# Patient Record
Sex: Female | Born: 1975
Health system: Southern US, Community
[De-identification: ages and names within clinical notes are randomized; demographics above are authoritative.]

## PROBLEM LIST (undated history)

## (undated) DIAGNOSIS — S92912A Unspecified fracture of left toe(s), initial encounter for closed fracture: Secondary | ICD-10-CM

## (undated) DIAGNOSIS — G43909 Migraine, unspecified, not intractable, without status migrainosus: Secondary | ICD-10-CM

## (undated) DIAGNOSIS — Z87898 Personal history of other specified conditions: Secondary | ICD-10-CM

## (undated) DIAGNOSIS — Z9889 Other specified postprocedural states: Secondary | ICD-10-CM

## (undated) DIAGNOSIS — K219 Gastro-esophageal reflux disease without esophagitis: Secondary | ICD-10-CM

## (undated) DIAGNOSIS — R112 Nausea with vomiting, unspecified: Secondary | ICD-10-CM

## (undated) DIAGNOSIS — M545 Low back pain, unspecified: Secondary | ICD-10-CM

## (undated) DIAGNOSIS — Z8669 Personal history of other diseases of the nervous system and sense organs: Secondary | ICD-10-CM

## (undated) DIAGNOSIS — F329 Major depressive disorder, single episode, unspecified: Secondary | ICD-10-CM

## (undated) DIAGNOSIS — E559 Vitamin D deficiency, unspecified: Secondary | ICD-10-CM

## (undated) DIAGNOSIS — G4700 Insomnia, unspecified: Secondary | ICD-10-CM

## (undated) DIAGNOSIS — M1990 Unspecified osteoarthritis, unspecified site: Secondary | ICD-10-CM

## (undated) DIAGNOSIS — F32A Depression, unspecified: Secondary | ICD-10-CM

## (undated) DIAGNOSIS — F419 Anxiety disorder, unspecified: Secondary | ICD-10-CM

## (undated) DIAGNOSIS — M793 Panniculitis, unspecified: Secondary | ICD-10-CM

## (undated) DIAGNOSIS — E519 Thiamine deficiency, unspecified: Secondary | ICD-10-CM

## (undated) DIAGNOSIS — E538 Deficiency of other specified B group vitamins: Secondary | ICD-10-CM

## (undated) HISTORY — PX: BREAST BIOPSY: SHX20

## (undated) HISTORY — PX: BREAST CYST ASPIRATION: SHX578

---

## 1999-08-10 HISTORY — PX: TUBAL LIGATION: SHX77

## 2003-04-26 ENCOUNTER — Emergency Department (HOSPITAL_COMMUNITY): Admission: EM | Admit: 2003-04-26 | Discharge: 2003-04-26 | Payer: Self-pay

## 2003-10-22 ENCOUNTER — Emergency Department (HOSPITAL_COMMUNITY): Admission: EM | Admit: 2003-10-22 | Discharge: 2003-10-22 | Payer: Self-pay | Admitting: Emergency Medicine

## 2004-04-16 ENCOUNTER — Other Ambulatory Visit: Admission: RE | Admit: 2004-04-16 | Discharge: 2004-04-16 | Payer: Self-pay | Admitting: Family Medicine

## 2004-04-16 ENCOUNTER — Ambulatory Visit: Payer: Self-pay | Admitting: Family Medicine

## 2004-05-04 ENCOUNTER — Ambulatory Visit: Payer: Self-pay | Admitting: Family Medicine

## 2004-05-04 ENCOUNTER — Ambulatory Visit (HOSPITAL_COMMUNITY): Admission: RE | Admit: 2004-05-04 | Discharge: 2004-05-04 | Payer: Self-pay | Admitting: Family Medicine

## 2004-05-06 ENCOUNTER — Ambulatory Visit (HOSPITAL_COMMUNITY): Admission: RE | Admit: 2004-05-06 | Discharge: 2004-05-06 | Payer: Self-pay | Admitting: Family Medicine

## 2004-05-06 ENCOUNTER — Encounter: Payer: Self-pay | Admitting: Cardiology

## 2004-05-12 ENCOUNTER — Emergency Department (HOSPITAL_COMMUNITY): Admission: EM | Admit: 2004-05-12 | Discharge: 2004-05-13 | Payer: Self-pay | Admitting: Emergency Medicine

## 2004-09-08 ENCOUNTER — Ambulatory Visit: Payer: Self-pay | Admitting: Family Medicine

## 2004-09-25 ENCOUNTER — Ambulatory Visit: Payer: Self-pay | Admitting: Family Medicine

## 2005-02-28 ENCOUNTER — Emergency Department (HOSPITAL_COMMUNITY): Admission: EM | Admit: 2005-02-28 | Discharge: 2005-02-28 | Payer: Self-pay | Admitting: Emergency Medicine

## 2005-10-06 ENCOUNTER — Ambulatory Visit: Payer: Self-pay | Admitting: Family Medicine

## 2005-10-12 ENCOUNTER — Ambulatory Visit (HOSPITAL_COMMUNITY): Admission: RE | Admit: 2005-10-12 | Discharge: 2005-10-12 | Payer: Self-pay | Admitting: Family Medicine

## 2006-02-17 ENCOUNTER — Ambulatory Visit: Payer: Self-pay | Admitting: Family Medicine

## 2006-02-17 ENCOUNTER — Encounter (INDEPENDENT_AMBULATORY_CARE_PROVIDER_SITE_OTHER): Payer: Self-pay | Admitting: Family Medicine

## 2006-09-06 ENCOUNTER — Ambulatory Visit: Payer: Self-pay | Admitting: Family Medicine

## 2006-09-29 ENCOUNTER — Ambulatory Visit: Payer: Self-pay | Admitting: Family Medicine

## 2007-01-01 ENCOUNTER — Emergency Department (HOSPITAL_COMMUNITY): Admission: EM | Admit: 2007-01-01 | Discharge: 2007-01-01 | Payer: Self-pay | Admitting: Emergency Medicine

## 2007-09-20 ENCOUNTER — Ambulatory Visit: Payer: Self-pay | Admitting: Family Medicine

## 2007-10-16 ENCOUNTER — Ambulatory Visit: Payer: Self-pay | Admitting: Family Medicine

## 2007-11-23 ENCOUNTER — Ambulatory Visit: Payer: Self-pay | Admitting: Family Medicine

## 2007-11-23 ENCOUNTER — Other Ambulatory Visit: Admission: RE | Admit: 2007-11-23 | Discharge: 2007-11-23 | Payer: Self-pay | Admitting: Family Medicine

## 2007-11-23 ENCOUNTER — Encounter (INDEPENDENT_AMBULATORY_CARE_PROVIDER_SITE_OTHER): Payer: Self-pay | Admitting: Family Medicine

## 2007-11-23 LAB — CONVERTED CEMR LAB
ALT: 14 units/L (ref 0–35)
AST: 16 units/L (ref 0–37)
Albumin: 4 g/dL (ref 3.5–5.2)
Alkaline Phosphatase: 58 units/L (ref 39–117)
BUN: 11 mg/dL (ref 6–23)
CO2: 23 meq/L (ref 19–32)
Calcium: 9 mg/dL (ref 8.4–10.5)
Chlamydia, DNA Probe: NEGATIVE
Chloride: 104 meq/L (ref 96–112)
Cholesterol: 166 mg/dL (ref 0–200)
Creatinine, Ser: 0.75 mg/dL (ref 0.40–1.20)
GC Probe Amp, Genital: NEGATIVE
Glucose, Bld: 89 mg/dL (ref 70–99)
HDL: 41 mg/dL (ref >39)
LDL Cholesterol: 110 mg/dL — ABNORMAL HIGH (ref 0–99)
Potassium: 4.2 meq/L (ref 3.5–5.3)
Sodium: 139 meq/L (ref 135–145)
TSH: 1.611 microintl units/mL (ref 0.350–5.50)
Total Bilirubin: 0.9 mg/dL (ref 0.3–1.2)
Total CHOL/HDL Ratio: 4
Total Protein: 7.2 g/dL (ref 6.0–8.3)
Triglycerides: 73 mg/dL (ref <150)
VLDL: 15 mg/dL (ref 0–40)

## 2007-12-05 ENCOUNTER — Encounter: Admission: RE | Admit: 2007-12-05 | Discharge: 2007-12-05 | Payer: Self-pay | Admitting: Family Medicine

## 2008-05-21 ENCOUNTER — Ambulatory Visit: Payer: Self-pay | Admitting: Internal Medicine

## 2009-04-17 ENCOUNTER — Encounter (INDEPENDENT_AMBULATORY_CARE_PROVIDER_SITE_OTHER): Payer: Self-pay | Admitting: Family Medicine

## 2009-04-17 ENCOUNTER — Other Ambulatory Visit: Admission: RE | Admit: 2009-04-17 | Discharge: 2009-04-17 | Payer: Self-pay | Admitting: Family Medicine

## 2009-04-17 ENCOUNTER — Ambulatory Visit: Payer: Self-pay | Admitting: Family Medicine

## 2009-04-17 LAB — CONVERTED CEMR LAB
BUN: 12 mg/dL (ref 6–23)
CO2: 24 meq/L (ref 19–32)
Calcium: 8.8 mg/dL (ref 8.4–10.5)
Chlamydia, DNA Probe: NEGATIVE
Chloride: 103 meq/L (ref 96–112)
Creatinine, Ser: 0.68 mg/dL (ref 0.40–1.20)
GC Probe Amp, Genital: NEGATIVE
Glucose, Bld: 86 mg/dL (ref 70–99)
Potassium: 4.7 meq/L (ref 3.5–5.3)
Sed Rate: 29 mm/hr — ABNORMAL HIGH (ref 0–22)
Sodium: 138 meq/L (ref 135–145)
Vit D, 25-Hydroxy: 29 ng/mL — ABNORMAL LOW (ref 30–89)

## 2010-02-19 ENCOUNTER — Ambulatory Visit: Payer: Self-pay | Admitting: Family Medicine

## 2010-03-03 ENCOUNTER — Ambulatory Visit (HOSPITAL_COMMUNITY): Admission: RE | Admit: 2010-03-03 | Discharge: 2010-03-03 | Payer: Self-pay | Admitting: Family Medicine

## 2010-03-23 ENCOUNTER — Encounter: Payer: Self-pay | Admitting: Family Medicine

## 2010-03-30 ENCOUNTER — Ambulatory Visit: Payer: Self-pay | Admitting: Family Medicine

## 2010-03-30 DIAGNOSIS — M25569 Pain in unspecified knee: Secondary | ICD-10-CM | POA: Insufficient documentation

## 2010-08-09 LAB — HM PAP SMEAR: HM Pap smear: NORMAL

## 2010-08-30 ENCOUNTER — Encounter: Payer: Self-pay | Admitting: Family Medicine

## 2010-09-08 NOTE — Assessment & Plan Note (Signed)
Summary: NP,CHRONIC L KNEE PAIN,MC   Vital Signs:  Patient profile:   35 year old female Height:      65 inches Weight:      245 pounds BMI:     40.92 BP sitting:   114 / 90  Vitals Entered By: Lillia Pauls CMA (March 30, 2010 3:58 PM)  History of Present Illness: New patient sent for further eval of  left knee pain  in 2006 she injured her left knee--was squatting and rotated to left and felt an acute pain. Not a Worker's Comp case. Has not had imaging. Has not done PT  Was painful for several days and got some better but never totally better. Now has pain daily, 5/10 - 7-10 at worst. worse  with activity or exercise. wants to get back to gym and is afraid of hurting it further.Has seemed to worsen in last few months. Left leg feels weak but no giving way, no locking. Does have occasional popping. No swelling or redness or warmth. No radiation of pain into calf.  PERTINENT PMH/PSH: no surgeries to left knee not diabetic ALLERGIES: NKDA MEDS: None  Current Medications (verified): 1)  None  Allergies (verified): No Known Drug Allergies  Review of Systems       some weigt gain over last few years. denies numbness or tingling in extremities  Physical Exam  General:  overweight-appearing.   Additional Exam:  XRAYS: 03/03/2010 reveal normal joint space in non standing films, no significant DJD.Report read as negative exam.  Patient given informed consent for injection. Discussed possible complications of infection, bleeding or skin atrophy at site of injection. Possible side effect of avascular necrosis (focal area of bone death) due to steroid use.Appropriate verbal time out taken Are cleaned and prepped in usual sterile fashion. A --1-- cc kennalog plus --4--cc 1% lidocaine without epinephrine was injected into the- left knee using anterior approach.--. Patient tolerated procedure well with no complications.    Knee Exam  Knee Exam:    Right:    Inspection:  Normal  Palpation:  Normal    Stability:  stable    Left:    Inspection:  Normal    mild TTP medial joint line. Liogamentously intact varus, valgus and drawer, lachman. Negative mcMurray. No pivot shift. No effusion, no warmth or redness. Patella normal tracking.  Special tests:    GAIT: normal stride length.  Some pain with squat Left knee  Patellar mobility:    Right normal    Left normal Patellar apprehension:    Right negative    Left negative   Impression & Recommendations:  Problem # 1:  KNEE PAIN, LEFT (ICD-719.46) chronic left kneepain--likely associated with small meniscal tear given history and exam. Does not look like a lot of DJD on films with preeserved joint space.    We discussed ( 20 minutes face to face time in counseling and discussion) activities that would be beneficial (bike, weight loss, swimm etc) and things to avoid (squats, leg extension with heavy weights--rec start w 5 pound left leg--currentl;y doing 45 punds right). Will try single injection to see if we can get her some pain relief. HEP HO given for knee strengthening (short arc quads esp). rtc as needed. Would not recommend repeated injections. RTC if not improviong with above regimen.

## 2010-09-08 NOTE — Letter (Signed)
Summary: [facility] Referral  [facility] Referral   Imported By: Marily Memos 03/25/2010 09:14:36  _____________________________________________________________________  External Attachment:    Type:   Image     Comment:   External Document

## 2010-10-01 ENCOUNTER — Inpatient Hospital Stay (INDEPENDENT_AMBULATORY_CARE_PROVIDER_SITE_OTHER)
Admission: RE | Admit: 2010-10-01 | Discharge: 2010-10-01 | Disposition: A | Discharge status: Home / Self Care | Payer: Medicaid Other | Source: Ambulatory Visit | Attending: Family Medicine | Admitting: Family Medicine

## 2010-10-01 DIAGNOSIS — J019 Acute sinusitis, unspecified: Secondary | ICD-10-CM

## 2011-02-05 ENCOUNTER — Ambulatory Visit (HOSPITAL_COMMUNITY)
Admission: RE | Admit: 2011-02-05 | Discharge: 2011-02-05 | Disposition: A | Discharge status: Home / Self Care | Payer: BC Managed Care – PPO | Source: Ambulatory Visit | Attending: Family Medicine | Admitting: Family Medicine

## 2011-02-05 ENCOUNTER — Other Ambulatory Visit (HOSPITAL_COMMUNITY): Payer: Self-pay | Admitting: Family Medicine

## 2011-02-05 DIAGNOSIS — M545 Low back pain, unspecified: Secondary | ICD-10-CM

## 2011-02-05 DIAGNOSIS — R209 Unspecified disturbances of skin sensation: Secondary | ICD-10-CM | POA: Insufficient documentation

## 2011-02-23 ENCOUNTER — Other Ambulatory Visit: Payer: Self-pay | Admitting: Family Medicine

## 2011-02-23 DIAGNOSIS — M545 Low back pain, unspecified: Secondary | ICD-10-CM

## 2011-03-03 ENCOUNTER — Other Ambulatory Visit: Payer: BC Managed Care – PPO

## 2011-03-21 ENCOUNTER — Inpatient Hospital Stay (INDEPENDENT_AMBULATORY_CARE_PROVIDER_SITE_OTHER)
Admission: RE | Admit: 2011-03-21 | Discharge: 2011-03-21 | Disposition: A | Discharge status: Home / Self Care | Payer: BC Managed Care – PPO | Source: Ambulatory Visit | Attending: Family Medicine | Admitting: Family Medicine

## 2011-03-21 DIAGNOSIS — R55 Syncope and collapse: Secondary | ICD-10-CM

## 2011-03-21 DIAGNOSIS — J069 Acute upper respiratory infection, unspecified: Secondary | ICD-10-CM

## 2011-03-21 DIAGNOSIS — R040 Epistaxis: Secondary | ICD-10-CM

## 2011-03-21 LAB — POCT I-STAT, CHEM 8
BUN: 7 mg/dL (ref 6–23)
Calcium, Ion: 1.16 mmol/L (ref 1.12–1.32)
Chloride: 102 mEq/L (ref 96–112)
Creatinine, Ser: 0.7 mg/dL (ref 0.50–1.10)
Glucose, Bld: 113 mg/dL — ABNORMAL HIGH (ref 70–99)
HCT: 39 % (ref 36.0–46.0)
Hemoglobin: 13.3 g/dL (ref 12.0–15.0)
Potassium: 3.7 mEq/L (ref 3.5–5.1)
Sodium: 141 mEq/L (ref 135–145)
TCO2: 26 mmol/L (ref 0–100)

## 2011-03-29 ENCOUNTER — Inpatient Hospital Stay (INDEPENDENT_AMBULATORY_CARE_PROVIDER_SITE_OTHER)
Admission: RE | Admit: 2011-03-29 | Discharge: 2011-03-29 | Disposition: A | Discharge status: Home / Self Care | Payer: BC Managed Care – PPO | Source: Ambulatory Visit | Attending: Emergency Medicine | Admitting: Emergency Medicine

## 2011-03-29 ENCOUNTER — Ambulatory Visit (INDEPENDENT_AMBULATORY_CARE_PROVIDER_SITE_OTHER): Payer: BC Managed Care – PPO

## 2011-03-29 DIAGNOSIS — R059 Cough, unspecified: Secondary | ICD-10-CM

## 2011-03-29 DIAGNOSIS — R05 Cough: Secondary | ICD-10-CM

## 2011-07-14 ENCOUNTER — Ambulatory Visit (INDEPENDENT_AMBULATORY_CARE_PROVIDER_SITE_OTHER): Payer: BC Managed Care – PPO | Admitting: Internal Medicine

## 2011-07-14 ENCOUNTER — Encounter: Payer: Self-pay | Admitting: Internal Medicine

## 2011-07-14 ENCOUNTER — Other Ambulatory Visit (INDEPENDENT_AMBULATORY_CARE_PROVIDER_SITE_OTHER): Payer: BC Managed Care – PPO

## 2011-07-14 VITALS — BP 108/72 | HR 71 | Temp 98.1°F | Resp 16 | Ht 65.0 in | Wt 268.0 lb

## 2011-07-14 DIAGNOSIS — Z0000 Encounter for general adult medical examination without abnormal findings: Secondary | ICD-10-CM

## 2011-07-14 DIAGNOSIS — Z23 Encounter for immunization: Secondary | ICD-10-CM

## 2011-07-14 DIAGNOSIS — E669 Obesity, unspecified: Secondary | ICD-10-CM | POA: Insufficient documentation

## 2011-07-14 LAB — COMPREHENSIVE METABOLIC PANEL
ALT: 28 U/L (ref 0–35)
AST: 29 U/L (ref 0–37)
Calcium: 8.8 mg/dL (ref 8.4–10.5)
Chloride: 107 mEq/L (ref 96–112)
Creatinine, Ser: 0.7 mg/dL (ref 0.4–1.2)
Sodium: 141 mEq/L (ref 135–145)
Total Protein: 7.1 g/dL (ref 6.0–8.3)

## 2011-07-14 LAB — CBC WITH DIFFERENTIAL/PLATELET
Basophils Absolute: 0 10*3/uL (ref 0.0–0.1)
Eosinophils Absolute: 0.4 10*3/uL (ref 0.0–0.7)
Lymphocytes Relative: 32.3 % (ref 12.0–46.0)
MCHC: 33.3 g/dL (ref 30.0–36.0)
Monocytes Relative: 9.1 % (ref 3.0–12.0)
Neutro Abs: 3.8 10*3/uL (ref 1.4–7.7)
Neutrophils Relative %: 52.5 % (ref 43.0–77.0)
Platelets: 371 10*3/uL (ref 150.0–400.0)
RDW: 14.7 % — ABNORMAL HIGH (ref 11.5–14.6)

## 2011-07-14 LAB — LIPID PANEL
Cholesterol: 149 mg/dL (ref 0–200)
HDL: 38 mg/dL — ABNORMAL LOW (ref >39.00)
LDL Cholesterol: 92 mg/dL (ref 0–99)
Total CHOL/HDL Ratio: 4
Triglycerides: 94 mg/dL (ref 0.0–149.0)
VLDL: 18.8 mg/dL (ref 0.0–40.0)

## 2011-07-14 NOTE — Patient Instructions (Signed)

## 2011-07-15 ENCOUNTER — Encounter: Payer: Self-pay | Admitting: Internal Medicine

## 2011-07-15 NOTE — Assessment & Plan Note (Signed)
Exam done, labs ordered, vaccines updated, pt ed material was given 

## 2011-07-15 NOTE — Progress Notes (Signed)
  Subjective:    Patient ID: Robyn Bryant, female    DOB: 03/10/1976, 35 y.o.   MRN: [id number]  HPI  New to me for a physical, she tells me that she had a PAP smear done earlier this year.  Review of Systems  Constitutional: Positive for unexpected weight change. Negative for fever, chills, diaphoresis, activity change, appetite change and fatigue.  HENT: Negative.   Eyes: Negative.   Respiratory: Negative.   Cardiovascular: Negative.   Gastrointestinal: Negative.   Genitourinary: Negative.   Musculoskeletal: Negative.   Skin: Negative.   Neurological: Negative.   Hematological: Negative.   Psychiatric/Behavioral: Negative.        Objective:   Physical Exam  Vitals reviewed. Constitutional: She is oriented to person, place, and time. She appears well-developed and well-nourished. No distress.  HENT:  Head: Normocephalic and atraumatic.  Mouth/Throat: Oropharynx is clear and moist. No oropharyngeal exudate.  Eyes: Conjunctivae are normal. Right eye exhibits no discharge. Left eye exhibits no discharge. No scleral icterus.  Neck: Normal range of motion. Neck supple. No JVD present. No tracheal deviation present. No thyromegaly present.  Cardiovascular: Normal rate, regular rhythm, normal heart sounds and intact distal pulses.  Exam reveals no gallop and no friction rub.   No murmur heard. Pulmonary/Chest: Effort normal and breath sounds normal. No stridor. No respiratory distress. She has no wheezes. She has no rales. She exhibits no tenderness.  Abdominal: Soft. Bowel sounds are normal. She exhibits no distension. There is no tenderness. There is no rebound and no guarding.  Musculoskeletal: Normal range of motion. She exhibits no edema and no tenderness.  Lymphadenopathy:    She has no cervical adenopathy.  Neurological: She is oriented to person, place, and time.  Skin: Skin is warm and dry. No rash noted. She is not diaphoretic. No erythema. No pallor.  Psychiatric: She  has a normal mood and affect. Her behavior is normal. Judgment and thought content normal.          Assessment & Plan:

## 2011-07-15 NOTE — Assessment & Plan Note (Addendum)
Labs ordered to assess for secondary causes and end organ damage. Nutrition consult requested.

## 2011-07-19 ENCOUNTER — Telehealth: Payer: Self-pay

## 2011-07-19 NOTE — Telephone Encounter (Signed)
Pt requests a return call with follow up question from recent OV.

## 2011-07-21 ENCOUNTER — Ambulatory Visit (INDEPENDENT_AMBULATORY_CARE_PROVIDER_SITE_OTHER): Payer: BC Managed Care – PPO | Admitting: Internal Medicine

## 2011-07-21 ENCOUNTER — Encounter: Payer: Self-pay | Admitting: Internal Medicine

## 2011-07-21 DIAGNOSIS — G8929 Other chronic pain: Secondary | ICD-10-CM | POA: Insufficient documentation

## 2011-07-21 DIAGNOSIS — R509 Fever, unspecified: Secondary | ICD-10-CM | POA: Insufficient documentation

## 2011-07-21 DIAGNOSIS — M2550 Pain in unspecified joint: Secondary | ICD-10-CM

## 2011-07-21 MED ORDER — NAPROXEN-ESOMEPRAZOLE 500-20 MG PO TBEC: 1.0000 | DELAYED_RELEASE_TABLET | Freq: Two times a day (BID) | ORAL | Status: DC

## 2011-07-21 MED ORDER — TRAMADOL HCL 50 MG PO TABS: 50.0000 mg | ORAL_TABLET | Freq: Two times a day (BID) | ORAL | Status: AC | PRN

## 2011-07-21 MED ORDER — VITAMIN D 1000 UNITS PO TABS: 1000.0000 [IU] | ORAL_TABLET | Freq: Every day | ORAL | Status: AC

## 2011-07-21 MED ORDER — AZITHROMYCIN 250 MG PO TABS: ORAL_TABLET | ORAL | Status: AC

## 2011-07-21 NOTE — Telephone Encounter (Signed)
Patient would like lab results, per letter results show early diabetes, is there any additional advisement regarding this at this time. Also pt c/o flu symptoms since receiving flu vaccine at last appt, please advise on what pt may do. [employer]

## 2011-07-21 NOTE — Telephone Encounter (Signed)
Patient notified and appt set for 4:30 07/21/11

## 2011-07-21 NOTE — Assessment & Plan Note (Signed)
12/12 - viral illness and poss a reaction to vaccines (less likely). Depo 120 mg IM Vimovo samples 1 bid Tramadol prn Rest To ER if worse Zpac if not better F/u w/Dr [name] next wk To work on Mon if ok 

## 2011-07-21 NOTE — Assessment & Plan Note (Signed)
12/12 - viral illness and poss a reaction to vaccines (less likely). Depo 120 mg IM Vimovo samples 1 bid Tramadol prn Rest To ER if worse Zpac if not better F/u w/Dr Yetta Barre next wk To work on Mon if ok

## 2011-07-21 NOTE — Patient Instructions (Signed)
Go to ER if worse 

## 2011-07-21 NOTE — Progress Notes (Signed)
  Subjective:    Patient ID: Robyn Bryant, female    DOB: 1975/10/29, 35 y.o.   MRN: [id number]  HPI  She had a flu shot and a tdap in L arm last wk (Tue) - sick  w/body ache ( since Sat), chills, fever. C/o L shoulder hurts after a shot... No abd pain, no ST, no cough. Pain is all over 8/10  Review of Systems  Constitutional: Positive for fever, chills, diaphoresis and appetite change. Negative for activity change, fatigue and unexpected weight change.  HENT: Positive for congestion and mouth sores. Negative for sinus pressure.   Eyes: Positive for pain. Negative for photophobia, redness and visual disturbance.  Respiratory: Negative for cough, chest tightness and shortness of breath.   Cardiovascular: Negative for chest pain.  Gastrointestinal: Negative for nausea, vomiting, abdominal pain, diarrhea and blood in stool.  Genitourinary: Negative for dysuria, frequency, difficulty urinating, vaginal pain and pelvic pain.  Musculoskeletal: Positive for myalgias and arthralgias. Negative for back pain, joint swelling and gait problem.  Skin: Negative for pallor, rash and wound.  Neurological: Negative for dizziness, tremors, weakness, numbness and headaches.  Psychiatric/Behavioral: Negative for suicidal ideas, confusion and sleep disturbance. The patient is not nervous/anxious.        Objective:   Physical Exam  Constitutional: She appears well-developed. No distress.       Obese Looks tired  HENT:  Head: Normocephalic.  Right Ear: External ear normal.  Left Ear: External ear normal.  Nose: Nose normal.  Mouth/Throat: No oropharyngeal exudate.       Geographic tongue Eryth throat  Eyes: Conjunctivae are normal. Pupils are equal, round, and reactive to light. Right eye exhibits no discharge. Left eye exhibits no discharge.  Neck: Normal range of motion. Neck supple. No JVD present. No tracheal deviation present. No thyromegaly present.  Cardiovascular: Normal rate, regular rhythm  and normal heart sounds.   Pulmonary/Chest: No stridor. No respiratory distress. She has no wheezes.  Abdominal: Soft. Bowel sounds are normal. She exhibits no distension and no mass. There is no tenderness. There is no rebound and no guarding.  Musculoskeletal: She exhibits tenderness (all joints and muscles). She exhibits no edema.  Lymphadenopathy:    She has no cervical adenopathy.  Neurological: She displays normal reflexes. No cranial nerve deficit. She exhibits normal muscle tone. Coordination normal.  Skin: No rash noted. No erythema.       L shoulder 1 cm sq infiltrate - tender w/a mild redness  Psychiatric: She has a normal mood and affect. Her behavior is normal. Judgment and thought content normal.    Lab Results  Component Value Date   WBC 7.2 07/14/2011   HGB 12.6 07/14/2011   HCT 37.9 07/14/2011   PLT 371.0 07/14/2011   GLUCOSE 90 07/14/2011   CHOL 149 07/14/2011   TRIG 94.0 07/14/2011   HDL 38.00* 07/14/2011   LDLCALC 92 07/14/2011   ALT 28 07/14/2011   AST 29 07/14/2011   NA 141 07/14/2011   K 4.7 07/14/2011   CL 107 07/14/2011   CREATININE 0.7 07/14/2011   BUN 10 07/14/2011   CO2 26 07/14/2011   TSH 1.26 07/14/2011   HGBA1C 6.0 07/14/2011         Assessment & Plan:

## 2011-07-21 NOTE — Telephone Encounter (Signed)
Lifestyle modifications for the DM (diet, exercise, weight loss), she needs to be seen for flu symptoms

## 2011-07-22 MED ORDER — METHYLPREDNISOLONE ACETATE PF 80 MG/ML IJ SUSP
120.0000 mg | Freq: Once | INTRAMUSCULAR | Status: AC
  Administered 2011-07-21: 120 mg via INTRAMUSCULAR

## 2011-07-22 NOTE — Progress Notes (Signed)
Addended by: Merrilyn Puma on: 07/22/2011 09:38 AM   Modules accepted: Orders

## 2011-10-20 ENCOUNTER — Other Ambulatory Visit (HOSPITAL_COMMUNITY)
Admission: RE | Admit: 2011-10-20 | Discharge: 2011-10-20 | Disposition: A | Discharge status: Home / Self Care | Payer: Medicaid Other | Source: Ambulatory Visit | Attending: Family Medicine | Admitting: Family Medicine

## 2011-10-20 ENCOUNTER — Other Ambulatory Visit: Payer: Self-pay | Admitting: Family Medicine

## 2011-10-20 DIAGNOSIS — R87810 Cervical high risk human papillomavirus (HPV) DNA test positive: Secondary | ICD-10-CM | POA: Insufficient documentation

## 2011-10-20 DIAGNOSIS — Z01419 Encounter for gynecological examination (general) (routine) without abnormal findings: Secondary | ICD-10-CM | POA: Insufficient documentation

## 2011-11-01 ENCOUNTER — Encounter: Payer: Self-pay | Admitting: Obstetrics and Gynecology

## 2012-04-06 ENCOUNTER — Encounter: Payer: BC Managed Care – PPO | Admitting: Physician Assistant

## 2013-03-28 ENCOUNTER — Encounter: Payer: Self-pay | Admitting: Internal Medicine

## 2013-03-28 ENCOUNTER — Ambulatory Visit (INDEPENDENT_AMBULATORY_CARE_PROVIDER_SITE_OTHER): Payer: BC Managed Care – PPO | Admitting: Internal Medicine

## 2013-03-28 ENCOUNTER — Other Ambulatory Visit (INDEPENDENT_AMBULATORY_CARE_PROVIDER_SITE_OTHER): Payer: BC Managed Care – PPO

## 2013-03-28 ENCOUNTER — Other Ambulatory Visit (HOSPITAL_COMMUNITY)
Admission: RE | Admit: 2013-03-28 | Discharge: 2013-03-28 | Disposition: A | Discharge status: Home / Self Care | Payer: BC Managed Care – PPO | Source: Ambulatory Visit | Attending: Internal Medicine | Admitting: Internal Medicine

## 2013-03-28 VITALS — BP 100/68 | HR 82 | Temp 98.0°F | Ht 65.0 in | Wt 261.1 lb

## 2013-03-28 DIAGNOSIS — Z0000 Encounter for general adult medical examination without abnormal findings: Secondary | ICD-10-CM

## 2013-03-28 DIAGNOSIS — L309 Dermatitis, unspecified: Secondary | ICD-10-CM

## 2013-03-28 DIAGNOSIS — R739 Hyperglycemia, unspecified: Secondary | ICD-10-CM | POA: Insufficient documentation

## 2013-03-28 DIAGNOSIS — M722 Plantar fascial fibromatosis: Secondary | ICD-10-CM

## 2013-03-28 DIAGNOSIS — E669 Obesity, unspecified: Secondary | ICD-10-CM

## 2013-03-28 DIAGNOSIS — R7309 Other abnormal glucose: Secondary | ICD-10-CM

## 2013-03-28 DIAGNOSIS — Z01419 Encounter for gynecological examination (general) (routine) without abnormal findings: Secondary | ICD-10-CM | POA: Insufficient documentation

## 2013-03-28 DIAGNOSIS — L259 Unspecified contact dermatitis, unspecified cause: Secondary | ICD-10-CM

## 2013-03-28 LAB — CBC WITH DIFFERENTIAL/PLATELET
Basophils Relative: 0.7 % (ref 0.0–3.0)
Eosinophils Relative: 4.6 % (ref 0.0–5.0)
HCT: 39.5 % (ref 36.0–46.0)
Hemoglobin: 13.3 g/dL (ref 12.0–15.0)
Lymphs Abs: 2.7 10*3/uL (ref 0.7–4.0)
MCV: 81.3 fl (ref 78.0–100.0)
Monocytes Absolute: 0.6 10*3/uL (ref 0.1–1.0)
Neutro Abs: 4.3 10*3/uL (ref 1.4–7.7)
Platelets: 409 10*3/uL — ABNORMAL HIGH (ref 150.0–400.0)
WBC: 8 10*3/uL (ref 4.5–10.5)

## 2013-03-28 LAB — HM PAP SMEAR: HM Pap smear: NORMAL

## 2013-03-28 LAB — COMPREHENSIVE METABOLIC PANEL
ALT: 16 U/L (ref 0–35)
AST: 18 U/L (ref 0–37)
Albumin: 3.4 g/dL — ABNORMAL LOW (ref 3.5–5.2)
Alkaline Phosphatase: 62 U/L (ref 39–117)
Potassium: 4 mEq/L (ref 3.5–5.1)
Sodium: 138 mEq/L (ref 135–145)
Total Bilirubin: 0.7 mg/dL (ref 0.3–1.2)
Total Protein: 7.1 g/dL (ref 6.0–8.3)

## 2013-03-28 LAB — LIPID PANEL: HDL: 37.7 mg/dL — ABNORMAL LOW (ref >39.00)

## 2013-03-28 LAB — HEMOGLOBIN A1C: Hgb A1c MFr Bld: 5.9 % (ref 4.6–6.5)

## 2013-03-28 MED ORDER — CLOBETASOL PROPIONATE 0.05 % EX OINT: TOPICAL_OINTMENT | Freq: Two times a day (BID) | CUTANEOUS | Status: DC

## 2013-03-28 NOTE — Progress Notes (Signed)
Subjective:    Patient ID: Robyn Bryant, female    DOB: 26-Apr-1976, 37 y.o.   MRN: [id number]  Arthritis Presents for follow-up visit. The disease course has been stable. The condition has lasted for 2 weeks. She complains of pain. She reports no stiffness, joint swelling or joint warmth. Affected locations include the left foot and right foot. Her pain is at a severity of 3/10. Associated symptoms include rash (on both hands, scaly and itchy). Pertinent negatives include no diarrhea, dry eyes, dry mouth, dysuria, fatigue, fever, pain at night, pain while resting, Raynaud's syndrome, uveitis or weight loss. Her pertinent risk factors include overuse. Past treatments include NSAIDs. The treatment provided moderate relief. Factors aggravating her arthritis include activity. Compliance with prior treatments has been variable.      Review of Systems  Constitutional: Negative.  Negative for fever, chills, weight loss, diaphoresis, activity change, appetite change, fatigue and unexpected weight change.  HENT: Negative.   Eyes: Negative.   Respiratory: Negative.  Negative for cough, chest tightness, shortness of breath, wheezing and stridor.   Cardiovascular: Negative.  Negative for chest pain, palpitations and leg swelling.  Gastrointestinal: Negative.  Negative for nausea, vomiting, abdominal pain, diarrhea, constipation and blood in stool.  Endocrine: Negative.   Genitourinary: Negative.  Negative for dysuria.  Musculoskeletal: Positive for arthritis. Negative for myalgias, back pain, joint swelling, arthralgias, gait problem and stiffness.  Skin: Positive for rash (on both hands, scaly and itchy). Negative for color change, pallor and wound.  Allergic/Immunologic: Negative.   Neurological: Negative.  Negative for dizziness, tremors, seizures, syncope, facial asymmetry, speech difficulty, weakness, light-headedness, numbness and headaches.  Hematological: Negative.  Negative for adenopathy.  Does not bruise/bleed easily.  Psychiatric/Behavioral: Negative.        Objective:   Physical Exam  Vitals reviewed. Constitutional: She is oriented to person, place, and time. She appears well-developed and well-nourished. No distress.  HENT:  Head: Normocephalic and atraumatic.  Mouth/Throat: Oropharynx is clear and moist. No oropharyngeal exudate.  Eyes: Conjunctivae are normal. Right eye exhibits no discharge. Left eye exhibits no discharge. No scleral icterus.  Neck: Normal range of motion. No JVD present. No tracheal deviation present. No thyromegaly present.  Cardiovascular: Normal rate, regular rhythm, normal heart sounds and intact distal pulses.  Exam reveals no gallop and no friction rub.   No murmur heard. Pulmonary/Chest: Effort normal and breath sounds normal. No stridor. No respiratory distress. She has no wheezes. She has no rales. Chest wall is not dull to percussion. She exhibits no mass, no tenderness, no bony tenderness, no laceration, no crepitus, no edema, no deformity, no swelling and no retraction. Right breast exhibits no inverted nipple, no mass, no nipple discharge, no skin change and no tenderness. Left breast exhibits no inverted nipple, no mass, no nipple discharge, no skin change and no tenderness. Breasts are symmetrical.  Abdominal: Soft. Bowel sounds are normal. She exhibits no distension and no mass. There is no tenderness. There is no rebound and no guarding. Hernia confirmed negative in the right inguinal area and confirmed negative in the left inguinal area.  Genitourinary: Rectum normal, vagina normal and uterus normal. Rectal exam shows no external hemorrhoid, no internal hemorrhoid, no fissure, no mass and no tenderness. Guaiac negative stool. No breast swelling, tenderness, discharge or bleeding. Pelvic exam was performed with patient supine. No labial fusion. There is no rash, tenderness, lesion or injury on the right labia. There is no rash, tenderness,  lesion or injury on the left  labia. Uterus is not deviated, not enlarged, not fixed and not tender. Cervix exhibits no motion tenderness, no discharge and no friability. Right adnexum displays no mass, no tenderness and no fullness. Left adnexum displays no mass, no tenderness and no fullness. No erythema, tenderness or bleeding around the vagina. No foreign body around the vagina. No signs of injury around the vagina. No vaginal discharge found.  Musculoskeletal: Normal range of motion. She exhibits no edema and no tenderness.       Right foot: Normal. She exhibits normal range of motion, no tenderness, no bony tenderness, no swelling, normal capillary refill, no crepitus, no deformity and no laceration.       Left foot: Normal. She exhibits normal range of motion, no tenderness, no bony tenderness, no swelling, normal capillary refill, no crepitus, no deformity and no laceration.  Lymphadenopathy:    She has no cervical adenopathy.       Right: No inguinal adenopathy present.       Left: No inguinal adenopathy present.  Neurological: She is oriented to person, place, and time.  Skin: Skin is warm and dry. No rash noted. She is not diaphoretic. No erythema. No pallor.  Mild peeling on both hands  Psychiatric: She has a normal mood and affect. Her behavior is normal. Judgment and thought content normal.      Lab Results  Component Value Date   WBC 7.2 07/14/2011   HGB 12.6 07/14/2011   HCT 37.9 07/14/2011   PLT 371.0 07/14/2011   GLUCOSE 90 07/14/2011   CHOL 149 07/14/2011   TRIG 94.0 07/14/2011   HDL 38.00* 07/14/2011   LDLCALC 92 07/14/2011   ALT 28 07/14/2011   AST 29 07/14/2011   NA 141 07/14/2011   K 4.7 07/14/2011   CL 107 07/14/2011   CREATININE 0.7 07/14/2011   BUN 10 07/14/2011   CO2 26 07/14/2011   TSH 1.26 07/14/2011   HGBA1C 6.0 07/14/2011      Assessment & Plan:

## 2013-03-28 NOTE — Assessment & Plan Note (Signed)
Exam done PAP collected and sent Vaccines were reviewed Labs ordered Pt ed material was given

## 2013-03-28 NOTE — Assessment & Plan Note (Signed)
She will try a steroid cream

## 2013-03-28 NOTE — Assessment & Plan Note (Signed)
She will start stretches and icing of the feet She was given pt ed material and will continue nsaids as needed

## 2013-03-28 NOTE — Patient Instructions (Signed)
Preventive Care for Adults, Female A healthy lifestyle and preventive care can promote health and wellness. Preventive health guidelines for women include the following key practices.  A routine yearly physical is a good way to check with your caregiver about your health and preventive screening. It is a chance to share any concerns and updates on your health, and to receive a thorough exam.  Visit your dentist for a routine exam and preventive care every 6 months. Brush your teeth twice a day and floss once a day. Good oral hygiene prevents tooth decay and gum disease.  The frequency of eye exams is based on your age, health, family medical history, use of contact lenses, and other factors. Follow your caregiver's recommendations for frequency of eye exams.  Eat a healthy diet. Foods like vegetables, fruits, whole grains, low-fat dairy products, and lean protein foods contain the nutrients you need without too many calories. Decrease your intake of foods high in solid fats, added sugars, and salt. Eat the right amount of calories for you.Get information about a proper diet from your caregiver, if necessary.  Regular physical exercise is one of the most important things you can do for your health. Most adults should get at least 150 minutes of moderate-intensity exercise (any activity that increases your heart rate and causes you to sweat) each week. In addition, most adults need muscle-strengthening exercises on 2 or more days a week.  Maintain a healthy weight. The body mass index (BMI) is a screening tool to identify possible weight problems. It provides an estimate of body fat based on height and weight. Your caregiver can help determine your BMI, and can help you achieve or maintain a healthy weight.For adults 20 years and older:  A BMI below 18.5 is considered underweight.  A BMI of 18.5 to 24.9 is normal.  A BMI of 25 to 29.9 is considered overweight.  A BMI of 30 and above is  considered obese.  Maintain normal blood lipids and cholesterol levels by exercising and minimizing your intake of saturated fat. Eat a balanced diet with plenty of fruit and vegetables. Blood tests for lipids and cholesterol should begin at age 20 and be repeated every 5 years. If your lipid or cholesterol levels are high, you are over 50, or you are at high risk for heart disease, you may need your cholesterol levels checked more frequently.Ongoing high lipid and cholesterol levels should be treated with medicines if diet and exercise are not effective.  If you smoke, find out from your caregiver how to quit. If you do not use tobacco, do not start.  If you are pregnant, do not drink alcohol. If you are breastfeeding, be very cautious about drinking alcohol. If you are not pregnant and choose to drink alcohol, do not exceed 1 drink per day. One drink is considered to be 12 ounces (355 mL) of beer, 5 ounces (148 mL) of wine, or 1.5 ounces (44 mL) of liquor.  Avoid use of street drugs. Do not share needles with anyone. Ask for help if you need support or instructions about stopping the use of drugs.  High blood pressure causes heart disease and increases the risk of stroke. Your blood pressure should be checked at least every 1 to 2 years. Ongoing high blood pressure should be treated with medicines if weight loss and exercise are not effective.  If you are 55 to 36 years old, ask your caregiver if you should take aspirin to prevent strokes.  Diabetes   screening involves taking a blood sample to check your fasting blood sugar level. This should be done once every 3 years, after age 45, if you are within normal weight and without risk factors for diabetes. Testing should be considered at a younger age or be carried out more frequently if you are overweight and have at least 1 risk factor for diabetes.  Breast cancer screening is essential preventive care for women. You should practice "breast  self-awareness." This means understanding the normal appearance and feel of your breasts and may include breast self-examination. Any changes detected, no matter how small, should be reported to a caregiver. Women in their 20s and 30s should have a clinical breast exam (CBE) by a caregiver as part of a regular health exam every 1 to 3 years. After age 40, women should have a CBE every year. Starting at age 40, women should consider having a mammography (breast X-ray test) every year. Women who have a family history of breast cancer should talk to their caregiver about genetic screening. Women at a high risk of breast cancer should talk to their caregivers about having magnetic resonance imaging (MRI) and a mammography every year.  The Pap test is a screening test for cervical cancer. A Pap test can show cell changes on the cervix that might become cervical cancer if left untreated. A Pap test is a procedure in which cells are obtained and examined from the lower end of the uterus (cervix).  Women should have a Pap test starting at age 21.  Between ages 21 and 29, Pap tests should be repeated every 2 years.  Beginning at age 30, you should have a Pap test every 3 years as long as the past 3 Pap tests have been normal.  Some women have medical problems that increase the chance of getting cervical cancer. Talk to your caregiver about these problems. It is especially important to talk to your caregiver if a new problem develops soon after your last Pap test. In these cases, your caregiver may recommend more frequent screening and Pap tests.  The above recommendations are the same for women who have or have not gotten the vaccine for human papillomavirus (HPV).  If you had a hysterectomy for a problem that was not cancer or a condition that could lead to cancer, then you no longer need Pap tests. Even if you no longer need a Pap test, a regular exam is a good idea to make sure no other problems are  starting.  If you are between ages 65 and 70, and you have had normal Pap tests going back 10 years, you no longer need Pap tests. Even if you no longer need a Pap test, a regular exam is a good idea to make sure no other problems are starting.  If you have had past treatment for cervical cancer or a condition that could lead to cancer, you need Pap tests and screening for cancer for at least 20 years after your treatment.  If Pap tests have been discontinued, risk factors (such as a new sexual partner) need to be reassessed to determine if screening should be resumed.  The HPV test is an additional test that may be used for cervical cancer screening. The HPV test looks for the virus that can cause the cell changes on the cervix. The cells collected during the Pap test can be tested for HPV. The HPV test could be used to screen women aged 30 years and older, and should   be used in women of any age who have unclear Pap test results. After the age of 30, women should have HPV testing at the same frequency as a Pap test.  Colorectal cancer can be detected and often prevented. Most routine colorectal cancer screening begins at the age of 50 and continues through age 75. However, your caregiver may recommend screening at an earlier age if you have risk factors for colon cancer. On a yearly basis, your caregiver may provide home test kits to check for hidden blood in the stool. Use of a small camera at the end of a tube, to directly examine the colon (sigmoidoscopy or colonoscopy), can detect the earliest forms of colorectal cancer. Talk to your caregiver about this at age 50, when routine screening begins. Direct examination of the colon should be repeated every 5 to 10 years through age 75, unless early forms of pre-cancerous polyps or small growths are found.  Hepatitis C blood testing is recommended for all people born from 1945 through 1965 and any individual with known risks for hepatitis C.  Practice  safe sex. Use condoms and avoid high-risk sexual practices to reduce the spread of sexually transmitted infections (STIs). STIs include gonorrhea, chlamydia, syphilis, trichomonas, herpes, HPV, and human immunodeficiency virus (HIV). Herpes, HIV, and HPV are viral illnesses that have no cure. They can result in disability, cancer, and death. Sexually active women aged 25 and younger should be checked for chlamydia. Older women with new or multiple partners should also be tested for chlamydia. Testing for other STIs is recommended if you are sexually active and at increased risk.  Osteoporosis is a disease in which the bones lose minerals and strength with aging. This can result in serious bone fractures. The risk of osteoporosis can be identified using a bone density scan. Women ages 65 and over and women at risk for fractures or osteoporosis should discuss screening with their caregivers. Ask your caregiver whether you should take a calcium supplement or vitamin D to reduce the rate of osteoporosis.  Menopause can be associated with physical symptoms and risks. Hormone replacement therapy is available to decrease symptoms and risks. You should talk to your caregiver about whether hormone replacement therapy is right for you.  Use sunscreen with sun protection factor (SPF) of 30 or more. Apply sunscreen liberally and repeatedly throughout the day. You should seek shade when your shadow is shorter than you. Protect yourself by wearing long sleeves, pants, a wide-brimmed hat, and sunglasses year round, whenever you are outdoors.  Once a month, do a whole body skin exam, using a mirror to look at the skin on your back. Notify your caregiver of new moles, moles that have irregular borders, moles that are larger than a pencil eraser, or moles that have changed in shape or color.  Stay current with required immunizations.  Influenza. You need a dose every fall (or winter). The composition of the flu vaccine  changes each year, so being vaccinated once is not enough.  Pneumococcal polysaccharide. You need 1 to 2 doses if you smoke cigarettes or if you have certain chronic medical conditions. You need 1 dose at age 65 (or older) if you have never been vaccinated.  Tetanus, diphtheria, pertussis (Tdap, Td). Get 1 dose of Tdap vaccine if you are younger than age 65, are over 65 and have contact with an infant, are a healthcare worker, are pregnant, or simply want to be protected from whooping cough. After that, you need a Td   booster dose every 10 years. Consult your caregiver if you have not had at least 3 tetanus and diphtheria-containing shots sometime in your life or have a deep or dirty wound.  HPV. You need this vaccine if you are a woman age 26 or younger. The vaccine is given in 3 doses over 6 months.  Measles, mumps, rubella (MMR). You need at least 1 dose of MMR if you were born in 1957 or later. You may also need a second dose.  Meningococcal. If you are age 19 to 21 and a first-year college student living in a residence hall, or have one of several medical conditions, you need to get vaccinated against meningococcal disease. You may also need additional booster doses.  Zoster (shingles). If you are age 60 or older, you should get this vaccine.  Varicella (chickenpox). If you have never had chickenpox or you were vaccinated but received only 1 dose, talk to your caregiver to find out if you need this vaccine.  Hepatitis A. You need this vaccine if you have a specific risk factor for hepatitis A virus infection or you simply wish to be protected from this disease. The vaccine is usually given as 2 doses, 6 to 18 months apart.  Hepatitis B. You need this vaccine if you have a specific risk factor for hepatitis B virus infection or you simply wish to be protected from this disease. The vaccine is given in 3 doses, usually over 6 months. Preventive Services / Frequency Ages 19 to 39  Blood  pressure check.** / Every 1 to 2 years.  Lipid and cholesterol check.** / Every 5 years beginning at age 20.  Clinical breast exam.** / Every 3 years for women in their 20s and 30s.  Pap test.** / Every 2 years from ages 21 through 29. Every 3 years starting at age 30 through age 65 or 70 with a history of 3 consecutive normal Pap tests.  HPV screening.** / Every 3 years from ages 30 through ages 65 to 70 with a history of 3 consecutive normal Pap tests.  Hepatitis C blood test.** / For any individual with known risks for hepatitis C.  Skin self-exam. / Monthly.  Influenza immunization.** / Every year.  Pneumococcal polysaccharide immunization.** / 1 to 2 doses if you smoke cigarettes or if you have certain chronic medical conditions.  Tetanus, diphtheria, pertussis (Tdap, Td) immunization. / A one-time dose of Tdap vaccine. After that, you need a Td booster dose every 10 years.  HPV immunization. / 3 doses over 6 months, if you are 26 and younger.  Measles, mumps, rubella (MMR) immunization. / You need at least 1 dose of MMR if you were born in 1957 or later. You may also need a second dose.  Meningococcal immunization. / 1 dose if you are age 19 to 21 and a first-year college student living in a residence hall, or have one of several medical conditions, you need to get vaccinated against meningococcal disease. You may also need additional booster doses.  Varicella immunization.** / Consult your caregiver.  Hepatitis A immunization.** / Consult your caregiver. 2 doses, 6 to 18 months apart.  Hepatitis B immunization.** / Consult your caregiver. 3 doses usually over 6 months. Ages 40 to 64  Blood pressure check.** / Every 1 to 2 years.  Lipid and cholesterol check.** / Every 5 years beginning at age 20.  Clinical breast exam.** / Every year after age 40.  Mammogram.** / Every year beginning at age 40   and continuing for as long as you are in good health. Consult with your  caregiver.  Pap test.** / Every 3 years starting at age 30 through age 65 or 70 with a history of 3 consecutive normal Pap tests.  HPV screening.** / Every 3 years from ages 30 through ages 65 to 70 with a history of 3 consecutive normal Pap tests.  Fecal occult blood test (FOBT) of stool. / Every year beginning at age 50 and continuing until age 75. You may not need to do this test if you get a colonoscopy every 10 years.  Flexible sigmoidoscopy or colonoscopy.** / Every 5 years for a flexible sigmoidoscopy or every 10 years for a colonoscopy beginning at age 50 and continuing until age 75.  Hepatitis C blood test.** / For all people born from 1945 through 1965 and any individual with known risks for hepatitis C.  Skin self-exam. / Monthly.  Influenza immunization.** / Every year.  Pneumococcal polysaccharide immunization.** / 1 to 2 doses if you smoke cigarettes or if you have certain chronic medical conditions.  Tetanus, diphtheria, pertussis (Tdap, Td) immunization.** / A one-time dose of Tdap vaccine. After that, you need a Td booster dose every 10 years.  Measles, mumps, rubella (MMR) immunization. / You need at least 1 dose of MMR if you were born in 1957 or later. You may also need a second dose.  Varicella immunization.** / Consult your caregiver.  Meningococcal immunization.** / Consult your caregiver.  Hepatitis A immunization.** / Consult your caregiver. 2 doses, 6 to 18 months apart.  Hepatitis B immunization.** / Consult your caregiver. 3 doses, usually over 6 months. Ages 65 and over  Blood pressure check.** / Every 1 to 2 years.  Lipid and cholesterol check.** / Every 5 years beginning at age 20.  Clinical breast exam.** / Every year after age 40.  Mammogram.** / Every year beginning at age 40 and continuing for as long as you are in good health. Consult with your caregiver.  Pap test.** / Every 3 years starting at age 30 through age 65 or 70 with a 3  consecutive normal Pap tests. Testing can be stopped between 65 and 70 with 3 consecutive normal Pap tests and no abnormal Pap or HPV tests in the past 10 years.  HPV screening.** / Every 3 years from ages 30 through ages 65 or 70 with a history of 3 consecutive normal Pap tests. Testing can be stopped between 65 and 70 with 3 consecutive normal Pap tests and no abnormal Pap or HPV tests in the past 10 years.  Fecal occult blood test (FOBT) of stool. / Every year beginning at age 50 and continuing until age 75. You may not need to do this test if you get a colonoscopy every 10 years.  Flexible sigmoidoscopy or colonoscopy.** / Every 5 years for a flexible sigmoidoscopy or every 10 years for a colonoscopy beginning at age 50 and continuing until age 75.  Hepatitis C blood test.** / For all people born from 1945 through 1965 and any individual with known risks for hepatitis C.  Osteoporosis screening.** / A one-time screening for women ages 65 and over and women at risk for fractures or osteoporosis.  Skin self-exam. / Monthly.  Influenza immunization.** / Every year.  Pneumococcal polysaccharide immunization.** / 1 dose at age 65 (or older) if you have never been vaccinated.  Tetanus, diphtheria, pertussis (Tdap, Td) immunization. / A one-time dose of Tdap vaccine if you are over   65 and have contact with an infant, are a Research scientist (physical sciences), or simply want to be protected from whooping cough. After that, you need a Td booster dose every 10 years.  Varicella immunization.** / Consult your caregiver.  Meningococcal immunization.** / Consult your caregiver.  Hepatitis A immunization.** / Consult your caregiver. 2 doses, 6 to 18 months apart.  Hepatitis B immunization.** / Check with your caregiver. 3 doses, usually over 6 months. ** Family history and personal history of risk and conditions may change your caregiver's recommendations. Document Released: 09/21/2001 Document Revised: 10/18/2011  Document Reviewed: 12/21/2010 [facility] Patient Information 2014 [place], [place]. Plantar Fasciitis Plantar fasciitis is a common condition that causes foot pain. It is soreness (inflammation) of the band of tough fibrous tissue on the bottom of the foot that runs from the heel bone (calcaneus) to the ball of the foot. The cause of this soreness may be from excessive standing, poor fitting shoes, running on hard surfaces, being overweight, having an abnormal walk, or overuse (this is common in runners) of the painful foot or feet. It is also common in aerobic exercise dancers and ballet dancers. SYMPTOMS  Most people with plantar fasciitis complain of:  Severe pain in the morning on the bottom of their foot especially when taking the first steps out of bed. This pain recedes after a few minutes of walking.  Severe pain is experienced also during walking following a long period of inactivity.  Pain is worse when walking barefoot or up stairs DIAGNOSIS   Your caregiver will diagnose this condition by examining and feeling your foot.  Special tests such as X-rays of your foot, are usually not needed. PREVENTION   Consult a sports medicine professional before beginning a new exercise program.  Walking programs offer a good workout. With walking there is a lower chance of overuse injuries common to runners. There is less impact and less jarring of the joints.  Begin all new exercise programs slowly. If problems or pain develop, decrease the amount of time or distance until you are at a comfortable level.  Wear good shoes and replace them regularly.  Stretch your foot and the heel cords at the back of the ankle (Achilles tendon) both before and after exercise.  Run or exercise on even surfaces that are not hard. For example, asphalt is better than pavement.  Do not run barefoot on hard surfaces.  If using a treadmill, vary the incline.  Do not continue to workout if you have foot or joint  problems. Seek professional help if they do not improve. HOME CARE INSTRUCTIONS   Avoid activities that cause you pain until you recover.  Use ice or cold packs on the problem or painful areas after working out.  Only take over-the-counter or prescription medicines for pain, discomfort, or fever as directed by your caregiver.  Soft shoe inserts or athletic shoes with air or gel sole cushions may be helpful.  If problems continue or become more severe, consult a sports medicine caregiver or your own health care provider. Cortisone is a potent anti-inflammatory medication that may be injected into the painful area. You can discuss this treatment with your caregiver. MAKE SURE YOU:   Understand these instructions.  Will watch your condition.  Will get help right away if you are not doing well or get worse. Document Released: 04/20/2001 Document Revised: 10/18/2011 Document Reviewed: 06/19/2008 [facility] Patient Information 2014 [place], [place].

## 2013-03-28 NOTE — Assessment & Plan Note (Signed)
She will work on her lifestyle modifications to lose weight 

## 2013-03-28 NOTE — Assessment & Plan Note (Signed)
I will check her A1C to see if she has developed DM2 

## 2013-03-29 ENCOUNTER — Encounter: Payer: Self-pay | Admitting: Internal Medicine

## 2013-04-02 ENCOUNTER — Encounter: Payer: Self-pay | Admitting: Internal Medicine

## 2013-04-25 ENCOUNTER — Encounter: Payer: Self-pay | Admitting: Internal Medicine

## 2013-04-25 ENCOUNTER — Ambulatory Visit (INDEPENDENT_AMBULATORY_CARE_PROVIDER_SITE_OTHER): Payer: BC Managed Care – PPO | Admitting: Internal Medicine

## 2013-04-25 VITALS — BP 108/82 | HR 86 | Temp 98.4°F | Resp 16 | Wt 255.0 lb

## 2013-04-25 DIAGNOSIS — M778 Other enthesopathies, not elsewhere classified: Secondary | ICD-10-CM | POA: Insufficient documentation

## 2013-04-25 DIAGNOSIS — G5601 Carpal tunnel syndrome, right upper limb: Secondary | ICD-10-CM

## 2013-04-25 DIAGNOSIS — G5600 Carpal tunnel syndrome, unspecified upper limb: Secondary | ICD-10-CM

## 2013-04-25 DIAGNOSIS — M722 Plantar fascial fibromatosis: Secondary | ICD-10-CM

## 2013-04-25 NOTE — Assessment & Plan Note (Signed)
Podiatry referral

## 2013-04-25 NOTE — Patient Instructions (Signed)

## 2013-04-25 NOTE — Assessment & Plan Note (Signed)
Refer for NCS and EMG to confirm the diagnosis and check the severity

## 2013-04-25 NOTE — Progress Notes (Signed)
Subjective:    Patient ID: Robyn Bryant, female    DOB: 1976-04-15, 37 y.o.   MRN: [id number]  Arthritis Presents for follow-up visit. The disease course has been worsening. The condition has lasted for 1 year. She complains of pain. She reports no stiffness or joint swelling. Affected locations include the right wrist, left foot and right foot. Her pain is at a severity of 2/10. Pertinent negatives include no diarrhea, dry eyes, dry mouth, dysuria, fatigue, fever, pain at night, pain while resting, rash, Raynaud's syndrome, uveitis or weight loss. Past treatments include NSAIDs. The treatment provided mild relief. Factors aggravating her arthritis include activity. Compliance with prior treatments has been variable.      Review of Systems  Constitutional: Negative.  Negative for fever, weight loss and fatigue.  HENT: Negative.   Eyes: Negative.   Respiratory: Negative.   Cardiovascular: Negative.   Gastrointestinal: Negative.  Negative for diarrhea.  Endocrine: Negative.   Genitourinary: Negative.  Negative for dysuria.  Musculoskeletal: Positive for arthralgias and arthritis. Negative for myalgias, back pain, joint swelling, gait problem and stiffness.  Skin: Negative for rash.  Allergic/Immunologic: Negative.   Neurological: Negative.   Hematological: Negative.   Psychiatric/Behavioral: Negative.        Objective:   Physical Exam  Vitals reviewed. Constitutional: She is oriented to person, place, and time. She appears well-developed and well-nourished. No distress.  HENT:  Head: Normocephalic and atraumatic.  Mouth/Throat: Oropharynx is clear and moist. No oropharyngeal exudate.  Eyes: Conjunctivae are normal. Right eye exhibits no discharge. Left eye exhibits no discharge. No scleral icterus.  Neck: Normal range of motion. Neck supple. No JVD present. No tracheal deviation present. No thyromegaly present.  Cardiovascular: Normal rate, regular rhythm, normal heart sounds  and intact distal pulses.  Exam reveals no gallop and no friction rub.   No murmur heard. Pulmonary/Chest: Effort normal and breath sounds normal. No stridor. No respiratory distress. She has no wheezes. She has no rales. She exhibits no tenderness.  Abdominal: Soft. Bowel sounds are normal. She exhibits no distension and no mass. There is no tenderness. There is no rebound and no guarding.  Musculoskeletal: Normal range of motion. She exhibits no edema and no tenderness.       Right wrist: Normal. She exhibits normal range of motion, no tenderness, no bony tenderness, no swelling, no effusion, no crepitus, no deformity and no laceration.       Right foot: Normal. She exhibits normal range of motion, no tenderness, no bony tenderness, no swelling, normal capillary refill, no crepitus, no deformity and no laceration.       Left foot: Normal. She exhibits normal range of motion, no tenderness, no bony tenderness, no swelling, normal capillary refill, no crepitus, no deformity and no laceration.  Lymphadenopathy:    She has no cervical adenopathy.  Neurological: She is oriented to person, place, and time.  Skin: Skin is warm and dry. No rash noted. She is not diaphoretic. No erythema. No pallor.  Psychiatric: She has a normal mood and affect. Her behavior is normal. Judgment and thought content normal.     Lab Results  Component Value Date   WBC 8.0 03/28/2013   HGB 13.3 03/28/2013   HCT 39.5 03/28/2013   PLT 409.0* 03/28/2013   GLUCOSE 92 03/28/2013   CHOL 157 03/28/2013   TRIG 109.0 03/28/2013   HDL 37.70* 03/28/2013   LDLCALC 98 03/28/2013   ALT 16 03/28/2013   AST 18 03/28/2013   NA  138 03/28/2013   K 4.0 03/28/2013   CL 105 03/28/2013   CREATININE 0.7 03/28/2013   BUN 9 03/28/2013   CO2 27 03/28/2013   TSH 2.14 03/28/2013   HGBA1C 5.9 03/28/2013       Assessment & Plan:

## 2013-05-02 ENCOUNTER — Ambulatory Visit (INDEPENDENT_AMBULATORY_CARE_PROVIDER_SITE_OTHER): Payer: BC Managed Care – PPO | Admitting: Internal Medicine

## 2013-05-02 ENCOUNTER — Encounter: Payer: Self-pay | Admitting: Internal Medicine

## 2013-05-02 VITALS — BP 106/64 | HR 68 | Temp 98.6°F | Resp 16 | Wt 254.0 lb

## 2013-05-02 DIAGNOSIS — J111 Influenza due to unidentified influenza virus with other respiratory manifestations: Secondary | ICD-10-CM

## 2013-05-02 MED ORDER — OSELTAMIVIR PHOSPHATE 75 MG PO CAPS: 75.0000 mg | ORAL_CAPSULE | Freq: Two times a day (BID) | ORAL | Status: DC

## 2013-05-02 NOTE — Progress Notes (Signed)
  Subjective:    Patient ID: Robyn Bryant, female    DOB: 1976-01-18, 36 y.o.   MRN: [id number]  HPI Robyn Bryant had the onset 9/23 of  Fever to 102, sore throat, myalgias and very tired. She has been taking tylenol for fever. Mild cough - dry.Not short of breath. No N/V/D. No known sick contacts. Has not had a flu shot.  PMH, FamHx and SocHx reviewed for any changes and relevance.  Current Outpatient Prescriptions on File Prior to Visit  Medication Sig Dispense Refill  . clobetasol ointment (TEMOVATE) 0.05 % Apply topically 2 (two) times daily.  45 g  0   No current facility-administered medications on file prior to visit.      Review of Systems System review is negative for any constitutional, cardiac, pulmonary, GI or neuro symptoms or complaints other than as described in the HPI.     Objective:   Physical Exam Filed Vitals:   05/02/13 1613  BP: 106/64  Pulse: 68  Temp: 98.6 F (37 C)  Taking APAP  Resp: 16   Wt Readings from Last 3 Encounters:  05/02/13 254 lb (115.214 kg)  04/25/13 255 lb (115.667 kg)  03/28/13 261 lb 1.3 oz (118.425 kg)   Gen'l- overweight woman who FLC but is not in distress HEENT- EACs clear, TMs normal, throat clear Nodes - negative Cor - RRR Pulm - CTAP Neuro - non-focal       Assessment & Plan:  Influenza like illness - fever, chills, myalgia, dry cough. No respiratory distress  Plan   Tamiflu 75 mg bid x 5  Hydrate  Tylenol 1000 mg three times a day on schedule  Rest  Out of work until no fever for more than 24 hrs.

## 2013-05-02 NOTE — Patient Instructions (Addendum)
Influenza like illness - fever, chills, myalgia, dry cough. No respiratory distress  Plan   Tamiflu 75 mg bid x 5  Hydrate  Tylenol 1000 mg three times a day on schedule  Rest  Out of work until no fever for more than 24 hrs.   Influenza Facts Flu (influenza) is a contagious respiratory illness caused by the influenza viruses. It can cause mild to severe illness. While most healthy people recover from the flu without specific treatment and without complications, older people, young children, and people with certain health conditions are at higher risk for serious complications from the flu, including death. CAUSES   The flu virus is spread from person to person by respiratory droplets from coughing and sneezing.  A person can also become infected by touching an object or surface with a virus on it and then touching their mouth, eye or nose.  Adults may be able to infect others from 1 day before symptoms occur and up to 7 days after getting sick. So it is possible to give someone the flu even before you know you are sick and continue to infect others while you are sick. SYMPTOMS   Fever (usually high).  Headache.  Tiredness (can be extreme).  Cough.  Sore throat.  Runny or stuffy nose.  Body aches.  Diarrhea and vomiting may also occur, particularly in children.  These symptoms are referred to as "flu-like symptoms". A lot of different illnesses, including the common cold, can have similar symptoms. DIAGNOSIS   There are tests that can determine if you have the flu as long you are tested within the first 2 or 3 days of illness.  A doctor's exam and additional tests may be needed to identify if you have a disease that is a complicating the flu. RISKS AND COMPLICATIONS  Some of the complications caused by the flu include:  Bacterial pneumonia or progressive pneumonia caused by the flu virus.  Loss of body fluids (dehydration).  Worsening of chronic medical conditions,  such as heart failure, asthma, or diabetes.  Sinus problems and ear infections. HOME CARE INSTRUCTIONS   Seek medical care early on.  If you are at high risk from complications of the flu, consult your health-care provider as soon as you develop flu-like symptoms. Those at high risk for complications include:  People 65 years or older.  People with chronic medical conditions, including diabetes.  Pregnant women.  Young children.  Your caregiver may recommend use of an antiviral medication to help treat the flu.  If you get the flu, get plenty of rest, drink a lot of liquids, and avoid using alcohol and tobacco.  You can take over-the-counter medications to relieve the symptoms of the flu if your caregiver approves. (Never give aspirin to children or teenagers who have flu-like symptoms, particularly fever). PREVENTION  The single best way to prevent the flu is to get a flu vaccine each fall. Other measures that can help protect against the flu are:  Antiviral Medications  A number of antiviral drugs are approved for use in preventing the flu. These are prescription medications, and a doctor should be consulted before they are used.  Habits for Good Health  Cover your nose and mouth with a tissue when you cough or sneeze, throw the tissue away after you use it.  Wash your hands often with soap and water, especially after you cough or sneeze. If you are not near water, use an alcohol-based hand cleaner.  Avoid people who  are sick.  If you get the flu, stay home from work or school. Avoid contact with other people so that you do not make them sick, too.  Try not to touch your eyes, nose, or mouth as germs ore often spread this way. IN CHILDREN, EMERGENCY WARNING SIGNS THAT NEED URGENT MEDICAL ATTENTION:  Fast breathing or trouble breathing.  Bluish skin color.  Not drinking enough fluids.  Not waking up or not interacting.  Being so irritable that the child does not  want to be held.  Flu-like symptoms improve but then return with fever and worse cough.  Fever with a rash. IN ADULTS, EMERGENCY WARNING SIGNS THAT NEED URGENT MEDICAL ATTENTION:  Difficulty breathing or shortness of breath.  Pain or pressure in the chest or abdomen.  Sudden dizziness.  Confusion.  Severe or persistent vomiting. SEEK IMMEDIATE MEDICAL CARE IF:  You or someone you know is experiencing any of the symptoms above. When you arrive at the emergency center,report that you think you have the flu. You may be asked to wear a mask and/or sit in a secluded area to protect others from getting sick. MAKE SURE YOU:   Understand these instructions.  Monitor your condition.  Seek medical care if you are getting worse, or not improving. Document Released: 07/29/2003 Document Revised: 10/18/2011 Document Reviewed: 04/24/2009 [facility] Patient Information 2014 [place], [place].

## 2013-05-10 ENCOUNTER — Encounter (INDEPENDENT_AMBULATORY_CARE_PROVIDER_SITE_OTHER): Payer: Self-pay | Admitting: General Surgery

## 2013-05-10 ENCOUNTER — Ambulatory Visit (INDEPENDENT_AMBULATORY_CARE_PROVIDER_SITE_OTHER): Payer: BC Managed Care – PPO | Admitting: General Surgery

## 2013-05-10 NOTE — Progress Notes (Signed)
Subjective:   Morbid obesity  Patient ID: Robyn Bryant, female   DOB: 1975/09/06, 37 y.o.   MRN: [id number]  HPI [name] y.o.female presents for consideration for surgical treatment for morbid obesity.  she  gives a history of progressive obesity since Approximately 10 years ago when she moved [place]. She relates this mostly to decreased activity and not walking as much as she did when she lives in [place]. She has been unable to get down to a healthier weight despite multiple attempts at medical management. She has been able to lose up to 50 pounds at a time but then plateaus. her weight has been affecting her in a number of ways includingSignificant foot pain with a diagnosis of plantar fasciitis. She has significant illness and her family with diabetes and hypertension and a strong family history of obesity and is very concerned about her long-term health going forward at her current weight..   she has been to our initial information seminar, researched surgical options thoroughly and is interested in Sleeve gastrectomy due to lack of foreign material and adjustments and some family members who have seen to have excessive weight loss after gastric bypass.  Past Medical History  Diagnosis Date  . Asthma    Past Surgical History  Procedure Laterality Date  . Tubal ligation     Current Outpatient Prescriptions  Medication Sig Dispense Refill  . clobetasol ointment (TEMOVATE) 0.05 % Apply topically 2 (two) times daily.  45 g  0   No current facility-administered medications for this visit.   Allergies  Allergen Reactions  . Latex Hives   History  Substance Use Topics  . Smoking status: Never Smoker   . Smokeless tobacco: Never Used  . Alcohol Use: No     Review of Systems  HENT: Negative.   Respiratory: Negative.   Cardiovascular: Negative.   Gastrointestinal: Negative.   Genitourinary: Negative.   Musculoskeletal: Positive for arthralgias.   Psychiatric/Behavioral: Negative.        Objective:   Physical Exam BP 122/78  Pulse 72  Temp(Src) 98.9 F (37.2 C) (Temporal)  Resp 15  Ht 5' 4.5" (1.638 m)  Wt 252 lb 6.4 oz (114.488 kg)  BMI 42.67 kg/m2  LMP 04/24/2013 General: Alert, Morbidly obese female, in no distress Skin: Warm and dry without rash or infection. HEENT: No palpable masses or thyromegaly. Sclera nonicteric. Pupils equal round and reactive. Oropharynx clear. Lymph nodes: No cervical, supraclavicular, or inguinal nodes palpable. Lungs: Breath sounds clear and equal without increased work of breathing Cardiovascular: Regular rate and rhythm without murmur. No JVD or edema. Peripheral pulses intact. Abdomen: Nondistended. Soft and nontender. No masses palpable. No organomegaly. No palpable hernias. Extremities: No edema or joint swelling or deformity. No chronic venous stasis changes. Neurologic: Alert and fully oriented. Gait normal.    Assessment:     Progressive morbid obesity unresponsive to multiple attempts at medical management in this 37 year old female who presents at 252 pounds and BMI of 42.7. I think she would have significant medical benefit if we get her down to a healthier weight. She has comorbidities of chronic extremity pain secondary to plantar fasciitis. We discussed the surgical options available today and she prefers sleeve gastrectomy due to the reasons above. I think this would be a perfectly good choice for her. We discussed the procedure in detail including its nature and expected recovery period we discussed additional risks including anesthetic complications, risks of laparoscopic surgery with injury to adjacent organs, bleeding,  infection, DVT. We discussed long-term risks of stricture or stenosis, nutritional deficiencies, weight regain, chronic nausea and reflux. She is given a complete consent form to review. All her questions were answered. We will proceed preoperative workup  including.    Plan:     Begin preoperative workup for planned sleeve gastrectomy as above.

## 2013-05-16 ENCOUNTER — Other Ambulatory Visit (INDEPENDENT_AMBULATORY_CARE_PROVIDER_SITE_OTHER): Payer: Self-pay | Admitting: General Surgery

## 2013-05-18 ENCOUNTER — Ambulatory Visit (INDEPENDENT_AMBULATORY_CARE_PROVIDER_SITE_OTHER): Payer: BC Managed Care – PPO

## 2013-05-18 ENCOUNTER — Ambulatory Visit (INDEPENDENT_AMBULATORY_CARE_PROVIDER_SITE_OTHER): Payer: BC Managed Care – PPO | Admitting: Neurology

## 2013-05-18 DIAGNOSIS — M79609 Pain in unspecified limb: Secondary | ICD-10-CM

## 2013-05-18 DIAGNOSIS — Z0289 Encounter for other administrative examinations: Secondary | ICD-10-CM

## 2013-05-18 NOTE — Procedures (Signed)
  HISTORY:  Robyn Bryant is a 37 year old patient with a several month history of right greater than left forearm discomfort associated with extension of the wrist. The patient denies any neck or shoulder discomfort. The patient is being evaluated for possible carpal tunnel syndrome.  NERVE CONDUCTION STUDIES:  Nerve conduction studies were performed on both upper extremities. The distal motor latencies and motor amplitudes for the median and ulnar nerves were within normal limits. The F wave latencies and nerve conduction velocities for these nerves were also normal. The sensory latencies for the median and ulnar nerves were normal.   EMG STUDIES:  EMG study was performed on the right upper extremity:  The first dorsal interosseous muscle reveals 2 to 4 K units with full recruitment. No fibrillations or positive waves were noted. The abductor pollicis brevis muscle reveals 2 to 4 K units with full recruitment. No fibrillations or positive waves were noted. The extensor indicis proprius muscle reveals 1 to 3 K units with full recruitment. No fibrillations or positive waves were noted. The pronator teres muscle reveals 2 to 3 K units with full recruitment. No fibrillations or positive waves were noted.  The patient refused further EMG evaluation.  IMPRESSION:  Nerve conduction studies done on both upper extremities were within normal limits. There is no evidence of carpal tunnel syndrome or other neuropathies on either side. A limited EMG study of the right upper extremity was unremarkable.  Marlan Palau MD 05/18/2013 10:44 AM  [facility] [address] [place], [place] [address]  Phone [phone] Fax [phone]

## 2013-05-22 ENCOUNTER — Other Ambulatory Visit (INDEPENDENT_AMBULATORY_CARE_PROVIDER_SITE_OTHER): Payer: Self-pay

## 2013-05-24 ENCOUNTER — Other Ambulatory Visit: Payer: Self-pay | Admitting: Internal Medicine

## 2013-06-01 ENCOUNTER — Telehealth: Payer: Self-pay | Admitting: Internal Medicine

## 2013-06-01 ENCOUNTER — Encounter (HOSPITAL_COMMUNITY): Payer: Self-pay | Admitting: Emergency Medicine

## 2013-06-01 ENCOUNTER — Emergency Department (INDEPENDENT_AMBULATORY_CARE_PROVIDER_SITE_OTHER)
Admission: EM | Admit: 2013-06-01 | Discharge: 2013-06-01 | Disposition: A | Discharge status: Home / Self Care | Payer: BC Managed Care – PPO | Source: Home / Self Care

## 2013-06-01 DIAGNOSIS — M65839 Other synovitis and tenosynovitis, unspecified forearm: Secondary | ICD-10-CM

## 2013-06-01 DIAGNOSIS — M778 Other enthesopathies, not elsewhere classified: Secondary | ICD-10-CM

## 2013-06-01 MED ORDER — TRAMADOL HCL 50 MG PO TABS: 50.0000 mg | ORAL_TABLET | Freq: Four times a day (QID) | ORAL | Status: DC | PRN

## 2013-06-01 NOTE — Telephone Encounter (Signed)
Patient was referred for CTS testing by Dr. Yetta Barre.  It was discovered that she does not have CTS.  Patient is still in a lot of pain.  She went to [facility] ER today and they told her that she had tendinitis and they gave her a brace.  She has to go back to work Monday.  She is requesting a letter to take to her employer outlining her limitations.

## 2013-06-01 NOTE — ED Notes (Signed)
Right forearm/hand pain.  Reports this has been going on for 3 weeks.  Has seen pcp and specialist-"nerve studies " done per patient, reports no diagnosis, but says it is not carpal tunnel.  Patient reports continued pain with no diagnosis.

## 2013-06-01 NOTE — ED Provider Notes (Signed)
CSN: [id number]     Arrival date & time 06/01/13  1203 History   First MD Initiated Contact with Patient 06/01/13 1213     Chief Complaint  Patient presents with  . Hand Pain   (Consider location/radiation/quality/duration/timing/severity/associated sxs/prior Treatment) HPI Comments: 37 year old obese female complaining of pain in the wrist and forearm for several weeks. She has been evaluated by her PCP and was referred to a neurologist for nerve conduction studies and EMG. The results of those were negative. She continues to have pain against worse especially with working. She comes in today for pain control and diagnosis for her wrist pain. She has not called her PCP. 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   Past Medical History  Diagnosis Date  . Asthma    Past Surgical History  Procedure Laterality Date  . Tubal ligation     Family History  Problem Relation Age of Onset  . Diabetes Mother   . Hypertension Mother   . Alcohol abuse Father   . Asthma Sister    History  Substance Use Topics  . Smoking status: Never Smoker   . Smokeless tobacco: Never Used  . Alcohol Use: No   OB History   Grav Para Term Preterm Abortions TAB SAB Ect Mult Living                 Review of Systems  Constitutional: Negative for fever, chills and activity change.  HENT: Negative.   Respiratory: Negative.   Cardiovascular: Negative.   Musculoskeletal: Negative for back pain and joint swelling.       As per HPI  Skin: Negative for color change, pallor and rash.  Neurological: Negative.     Allergies  Latex  Home Medications   Current Outpatient Rx  Name  Route  Sig  Dispense  Refill  . clobetasol ointment (TEMOVATE) 0.05 %      APPLY TOPICALLY 2 (TWO) TIMES DAILY.   45 g   0   . traMADol (ULTRAM) 50 MG tablet   Oral   Take 1 tablet (50 mg total) by mouth every 6 (six) hours as needed for pain.   20 tablet   0    BP 126/68  Pulse 73   Temp(Src) 98 F (36.7 C) (Oral)  Resp 22  SpO2 100%  LMP 06/01/2013 Physical Exam  Nursing note and vitals reviewed. Constitutional: She is oriented to person, place, and time. She appears well-developed and well-nourished. No distress.  HENT:  Head: Normocephalic and atraumatic.  Eyes: EOM are normal.  Neck: Normal range of motion. Neck supple.  Pulmonary/Chest: Effort normal. No respiratory distress.  Musculoskeletal:  Tenderness in the wrist and distal forearm. There is limited range of motion due to pain in the wrist and distal forearm. She points to the extensor pollicis longus as a source of pain. Positive Finkelstein sign. Extension the wrist produces pain traveling along the distal forearm as well. Palpation of individual  tendons produces pain. Radial pulses 2+. Distal neurovascular and sensory is intact. There is no deformity. There is minor swelling over the distal forearm.  Neurological: She is alert and oriented to person, place, and time. No cranial nerve deficit.  Skin: Skin is warm and dry.  Psychiatric: She has a normal mood and affect.    ED Course  Procedures (including critical care time) Labs Review Labs Reviewed - No data to display Imaging Review No results found.    MDM  1. Tendinitis of right wrist        Wear wrist splint while working at work and home Ice as directed Limit lifting and grasping with hand and wrist. F/U with your doctor.  Hayden Rasmussen, NP 06/01/13 1426

## 2013-06-01 NOTE — ED Provider Notes (Signed)
Medical screening examination/treatment/procedure(s) were performed by a resident physician or non-physician practitioner and as the supervising physician I was immediately available for consultation/collaboration.  Candra Wegner, MD   Aleila Syverson S Astra Gregg, MD 06/01/13 1443 

## 2013-06-03 NOTE — Telephone Encounter (Signed)
What are her limitations?

## 2013-06-05 ENCOUNTER — Telehealth: Payer: Self-pay | Admitting: Internal Medicine

## 2013-06-05 NOTE — Telephone Encounter (Signed)
06/05/2013   Pt left message in regards to a message she received from [place] (?).  Pt would like to speak with Dr. In regards to an urgent care visit pt had on 06/01/2013 and would like to know her limitations; pt is wearing a splint.  Please advise if pt needs to schedule a FU for Dr. Yetta Barre.

## 2013-06-06 ENCOUNTER — Ambulatory Visit (HOSPITAL_COMMUNITY)
Admission: RE | Admit: 2013-06-06 | Discharge: 2013-06-06 | Disposition: A | Discharge status: Home / Self Care | Payer: BC Managed Care – PPO | Source: Ambulatory Visit | Attending: General Surgery | Admitting: General Surgery

## 2013-06-06 ENCOUNTER — Other Ambulatory Visit: Payer: Self-pay

## 2013-06-06 DIAGNOSIS — M722 Plantar fascial fibromatosis: Secondary | ICD-10-CM | POA: Insufficient documentation

## 2013-06-06 DIAGNOSIS — Z6841 Body Mass Index (BMI) 40.0 and over, adult: Secondary | ICD-10-CM | POA: Insufficient documentation

## 2013-06-06 DIAGNOSIS — I10 Essential (primary) hypertension: Secondary | ICD-10-CM | POA: Insufficient documentation

## 2013-06-06 DIAGNOSIS — K7689 Other specified diseases of liver: Secondary | ICD-10-CM | POA: Insufficient documentation

## 2013-06-06 DIAGNOSIS — Z01818 Encounter for other preprocedural examination: Secondary | ICD-10-CM | POA: Insufficient documentation

## 2013-06-06 DIAGNOSIS — E119 Type 2 diabetes mellitus without complications: Secondary | ICD-10-CM | POA: Insufficient documentation

## 2013-06-06 DIAGNOSIS — K219 Gastro-esophageal reflux disease without esophagitis: Secondary | ICD-10-CM | POA: Insufficient documentation

## 2013-06-07 ENCOUNTER — Encounter: Payer: Self-pay | Admitting: Internal Medicine

## 2013-06-07 ENCOUNTER — Ambulatory Visit (INDEPENDENT_AMBULATORY_CARE_PROVIDER_SITE_OTHER): Payer: BC Managed Care – PPO | Admitting: Internal Medicine

## 2013-06-07 VITALS — BP 140/82 | HR 73 | Temp 98.9°F | Resp 16 | Wt 255.0 lb

## 2013-06-07 DIAGNOSIS — M533 Sacrococcygeal disorders, not elsewhere classified: Secondary | ICD-10-CM | POA: Insufficient documentation

## 2013-06-07 DIAGNOSIS — Z23 Encounter for immunization: Secondary | ICD-10-CM

## 2013-06-07 DIAGNOSIS — M65839 Other synovitis and tenosynovitis, unspecified forearm: Secondary | ICD-10-CM

## 2013-06-07 DIAGNOSIS — M778 Other enthesopathies, not elsewhere classified: Secondary | ICD-10-CM

## 2013-06-07 NOTE — Patient Instructions (Signed)
Tailbone Injury °The tailbone (coccyx) is the small bone at the lower end of the spine. A tailbone injury may involve stretched ligaments, bruising, or a broken bone (fracture). Women are more vulnerable to this injury due to having a wider pelvis. °CAUSES  °This type of injury typically occurs from falling and landing on the tailbone. Repeated strain or friction from actions such as rowing and bicycling may also injure the area. The tailbone can be injured during childbirth. Infections or tumors may also press on the tailbone and cause pain. Sometimes, the cause of injury is unknown. °SYMPTOMS  °· Bruising. °· Pain when sitting. °· Painful bowel movements. °· In women, pain during intercourse. °DIAGNOSIS  °Your caregiver can diagnose a tailbone injury based on your symptoms and a physical exam. X-rays may be taken if a fracture is suspected. Your caregiver may also use an MRI scan imaging test to evaluate your symptoms. °TREATMENT  °Your caregiver may prescribe medicines to help relieve your pain. Most tailbone injuries heal on their own in 4 to 6 weeks. However, if the injury is caused by an infection or tumor, the recovery period may vary. °PREVENTION  °Wear appropriate padding and sports gear when bicycling and rowing. This can help prevent an injury from repeated strain or friction. °HOME CARE INSTRUCTIONS  °· Put ice on the injured area. °· Put ice in a plastic bag. °· Place a towel between your skin and the bag. °· Leave the ice on for 15-20 minutes, every hour while awake for the first 1 to 2 days. °· Sit on a large, rubber or inflated ring or cushion to ease your pain. Lean forward when sitting to help decrease discomfort. °· Avoid sitting for long periods of time. °· Increase your activity as the pain allows. °· Only take over-the-counter or prescription medicines for pain, discomfort, or fever as directed by your caregiver. °· You may use stool softeners if it is painful to have a bowel movement, or as  directed by your caregiver. °· Eat a diet with plenty of fiber to help prevent constipation. °· Keep all follow-up appointments as directed by your caregiver. °SEEK MEDICAL CARE IF:  °· Your pain becomes worse. °· Your bowel movements cause a great deal of discomfort. °· You are unable to have a bowel movement. °· You have a fever. °MAKE SURE YOU: °· Understand these instructions. °· Will watch your condition. °· Will get help right away if you are not doing well or get worse. °Document Released: 07/23/2000 Document Revised: 10/18/2011 Document Reviewed: 02/18/2011 °[employer]® Patient Information ©2014 [employer]. ° °

## 2013-06-09 ENCOUNTER — Encounter: Payer: Self-pay | Admitting: Internal Medicine

## 2013-06-09 NOTE — Assessment & Plan Note (Signed)
She will take tramadol for pain She will stay in the splint for now I have written a note for her work place

## 2013-06-09 NOTE — Progress Notes (Signed)
Subjective:    Patient ID: Robyn Bryant, female    DOB: 03-14-1976, 37 y.o.   MRN: [id number]  HPI  She came in today for f/up on right wrist pain - she was seen at an [facility] and was told that she has tendonitis in her right thumb and wrist and was placed in a splint - she a needs a note for work stating that she can't use her right hand or wrist. Also, she woke up today with "tailbone pain" with no preceding trauma or injury.  Review of Systems  Constitutional: Negative.   HENT: Negative.   Eyes: Negative.   Respiratory: Negative.  Negative for cough, chest tightness, shortness of breath, wheezing and stridor.   Cardiovascular: Negative.  Negative for chest pain, palpitations and leg swelling.  Gastrointestinal: Negative.  Negative for abdominal pain.  Endocrine: Negative.   Genitourinary: Negative.   Musculoskeletal: Positive for arthralgias and back pain.  Allergic/Immunologic: Negative.   Neurological: Negative.   Hematological: Negative.  Negative for adenopathy. Does not bruise/bleed easily.  Psychiatric/Behavioral: Negative.        Objective:   Physical Exam  Vitals reviewed. Constitutional: She is oriented to person, place, and time. She appears well-developed and well-nourished. No distress.  HENT:  Head: Normocephalic and atraumatic.  Mouth/Throat: Oropharynx is clear and moist. No oropharyngeal exudate.  Eyes: Conjunctivae are normal. Right eye exhibits no discharge. Left eye exhibits no discharge. No scleral icterus.  Neck: Normal range of motion. Neck supple. No JVD present. No tracheal deviation present. No thyromegaly present.  Cardiovascular: Normal rate, regular rhythm, normal heart sounds and intact distal pulses.  Exam reveals no gallop.   No murmur heard. Pulmonary/Chest: Effort normal and breath sounds normal. No stridor. No respiratory distress. She has no wheezes. She has no rales. She exhibits no tenderness.  Abdominal: Soft. Bowel sounds are normal. She  exhibits no distension and no mass. There is no tenderness. There is no rebound and no guarding.  Musculoskeletal: Normal range of motion. She exhibits no edema and no tenderness.       Right wrist: Normal. She exhibits normal range of motion, no tenderness, no bony tenderness, no swelling, no effusion, no crepitus, no deformity and no laceration.       Lumbar back: Normal. She exhibits normal range of motion, no tenderness, no bony tenderness, no swelling, no edema, no deformity, no laceration, no pain, no spasm and normal pulse.       Right forearm: Normal. She exhibits no tenderness, no bony tenderness, no swelling, no edema, no deformity and no laceration.       Right hand: She exhibits normal range of motion, no tenderness, no bony tenderness, normal capillary refill, no deformity, no laceration and no swelling. Normal sensation noted. Decreased sensation is not present in the ulnar distribution, is not present in the medial distribution and is not present in the radial distribution. Normal strength noted.  Lymphadenopathy:    She has no cervical adenopathy.  Neurological: She is alert and oriented to person, place, and time. She has normal reflexes. She displays normal reflexes. No cranial nerve deficit. She exhibits normal muscle tone. Coordination normal.  Skin: Skin is warm and dry. No rash noted. She is not diaphoretic. No erythema. No pallor.  Psychiatric: She has a normal mood and affect. Her behavior is normal. Judgment and thought content normal.     Lab Results  Component Value Date   WBC 8.0 03/28/2013   HGB 13.3 03/28/2013  HCT 39.5 03/28/2013   PLT 409.0* 03/28/2013   GLUCOSE 92 03/28/2013   CHOL 157 03/28/2013   TRIG 109.0 03/28/2013   HDL 37.70* 03/28/2013   LDLCALC 98 03/28/2013   ALT 16 03/28/2013   AST 18 03/28/2013   NA 138 03/28/2013   K 4.0 03/28/2013   CL 105 03/28/2013   CREATININE 0.7 03/28/2013   BUN 9 03/28/2013   CO2 27 03/28/2013   TSH 2.14 03/28/2013   HGBA1C 5.9  03/28/2013       Assessment & Plan:

## 2013-06-09 NOTE — Assessment & Plan Note (Signed)
She will cont tramadol for pain I have asked her to have a coccyx film done

## 2013-06-15 DIAGNOSIS — Z0279 Encounter for issue of other medical certificate: Secondary | ICD-10-CM

## 2013-06-18 ENCOUNTER — Ambulatory Visit: Payer: BC Managed Care – PPO | Admitting: Dietician

## 2013-06-21 ENCOUNTER — Encounter: Payer: BC Managed Care – PPO | Attending: General Surgery | Admitting: Dietician

## 2013-06-21 DIAGNOSIS — Z713 Dietary counseling and surveillance: Secondary | ICD-10-CM | POA: Insufficient documentation

## 2013-06-21 NOTE — Patient Instructions (Signed)
[facility] to call the [facility] to enroll in Pre-Op and Post-Op Nutrition Education when surgery date is scheduled. 

## 2013-06-21 NOTE — Progress Notes (Signed)
  Pre-Op Assessment Visit:  Pre-Operative Sleeve Gastrectomy Surgery  Medical Nutrition Therapy:  Appt start time: 1015   End time:  1045.  Patient was seen on 06/21/2013 for Pre-Operative Sleeve Gastrectomy Nutrition Assessment. Assessment and letter of approval faxed to [facility] Surgery Bariatric Surgery Program coordinator on 06/21/2013.   Handouts given during visit include:  Pre-Op Goals Bariatric Surgery Protein Shakes  Patient to call the [facility] to enroll in Pre-Op and Post-Op Nutrition Education when surgery date is scheduled.

## 2013-07-02 ENCOUNTER — Telehealth: Payer: Self-pay

## 2013-07-02 DIAGNOSIS — M778 Other enthesopathies, not elsewhere classified: Secondary | ICD-10-CM

## 2013-07-02 NOTE — Telephone Encounter (Signed)
The patient called hoping to get a referral to a dr for her wrist pain.  She stated she is out of work for this problem, and the pain is beyond what she can bare.   Thanks!

## 2013-07-02 NOTE — Telephone Encounter (Signed)
done

## 2013-07-04 ENCOUNTER — Encounter: Payer: Self-pay | Admitting: Family Medicine

## 2013-07-04 ENCOUNTER — Ambulatory Visit (INDEPENDENT_AMBULATORY_CARE_PROVIDER_SITE_OTHER): Payer: BC Managed Care – PPO | Admitting: Family Medicine

## 2013-07-04 ENCOUNTER — Encounter: Payer: Self-pay | Admitting: *Deleted

## 2013-07-04 ENCOUNTER — Other Ambulatory Visit (INDEPENDENT_AMBULATORY_CARE_PROVIDER_SITE_OTHER): Payer: BC Managed Care – PPO

## 2013-07-04 VITALS — BP 110/72 | HR 96 | Wt 261.0 lb

## 2013-07-04 DIAGNOSIS — M778 Other enthesopathies, not elsewhere classified: Secondary | ICD-10-CM

## 2013-07-04 DIAGNOSIS — M65839 Other synovitis and tenosynovitis, unspecified forearm: Secondary | ICD-10-CM

## 2013-07-04 DIAGNOSIS — M654 Radial styloid tenosynovitis [de Quervain]: Secondary | ICD-10-CM | POA: Insufficient documentation

## 2013-07-04 MED ORDER — MELOXICAM 15 MG PO TABS: 15.0000 mg | ORAL_TABLET | Freq: Every day | ORAL | Status: DC

## 2013-07-04 NOTE — Patient Instructions (Signed)
Good to meet you 2 weeks of light duty  Exercises starting in 72 hours.  Meloxicam daily for 10 days.  Ice 20 minutes can be helpful.  Come back in 2 weeks.

## 2013-07-04 NOTE — Assessment & Plan Note (Signed)
Patient had Arty failed most conservative therapy so we did do an injection today that completely resolved her pain. Patient knows though that her pain will likely come back after anesthesia is worn off. Patient will start exercises in 72 hours and was given home exercise program. Patient will continue to wear the thumb spica splint for the next 2 weeks. Patient will do light duty at work for 2 weeks and then come back for further evaluation. As long as she is doing well at that time we can release her to full duty and get her out of the splint. We also discussed meloxicam daily for 10 days as well as icing.

## 2013-07-04 NOTE — Progress Notes (Signed)
Pre-visit discussion using our clinic review tool. No additional management support is needed unless otherwise documented below in the visit note.  

## 2013-07-04 NOTE — Progress Notes (Signed)
I'm seeing this patient by the request  of:  Sanda Linger, MD   CC: Right wrist pain  HPI: Patient is a pleasant 37 year old right-hand-dominant female come in with right wrist pain. Patient was seen in urgent care previously and told that she had tendinitis of her right thumb and wrist and was placed in a splint. Patient has no significant pain while she is at work. Patient states that when she does a lot of lifting and grabbing she has more pain. Patient's job is very physical. Patient has been wearing the brace fairly regularly as well as icing it. Patient though is having pain with his right wrist previously. Patient actually did he have a EMG previously. Patient's EMG was reviewed by me today that did not show any significant neuropathy. Patient has been out of work for last month. Patient states still hurts to open cans and is a shapr 10/10 pain. It is somewhat better in the thumb spica cast.   Past medical, surgical, family and social history reviewed. Medications reviewed all in the electronic medical record.   Review of Systems: No headache, visual changes, nausea, vomiting, diarrhea, constipation, dizziness, abdominal pain, skin rash, fevers, chills, night sweats, weight loss, swollen lymph nodes, body aches, joint swelling, muscle aches, chest pain, shortness of breath, mood changes.   Objective:    Blood pressure 110/72, pulse 96, weight 261 lb (118.389 kg), SpO2 95.00%.   General: No apparent distress alert and oriented x3 mood and affect normal, dressed appropriately.  HEENT: Pupils equal, extraocular movements intact Respiratory: Patient's speak in full sentences and does not appear short of breath Cardiovascular: No lower extremity edema, non tender, no erythema Skin: Warm dry intact with no signs of infection or rash on extremities or on axial skeleton. Abdomen: Soft nontender Neuro: Cranial nerves II through XII are intact, neurovascularly intact in all extremities with 2+  DTRs and 2+ pulses. Lymph: No lymphadenopathy of posterior or anterior cervical chain or axillae bilaterally.  Gait normal with good balance and coordination.  MSK: Non tender with full range of motion and good stability and symmetric strength and tone of shoulders, elbows, hip, knee and ankles bilaterally.  Wrist: Right Inspection normal with no visible erythema or swelling. ROM smooth and normal with good flexion and extension and ulnar but pain with radial deviation  Palpation is normal over metacarpals, navicular, lunate, and TFCC; tendons without tenderness/ swelling No snuffbox tenderness. No tenderness over Canal of Guyon. Strength 5/5 in all directions without pain. Positive Finkelstein, negative tinel's and phalens. Negative Watson's test.  MSK US performed of: Right wrist This study was ordered, performed, and interpreted by Terrilee Files D.O.  Wrist: All extensor compartments visualized and patient's first compartments with the abductor pollicis longus tendon having significant hypoechoic changes surrounding the tendon sheath. There is also a potential tear that seems to be intersubstance.  TFCC intact. Scapholunate ligament intact. Carpal tunnel visualized and median nerve area normal, flexor tendons all normal in appearance without fraying, tears, or sheath effusions. Power doppler signal normal.  IMPRESSION:  De Quervain's tenosynovitis  Procedure: Real-time Ultrasound Guided Injection of right abductor pollicis longus tendon sheath Device: GE Logiq E  Ultrasound guided injection is preferred based studies that show increased duration, increased effect, greater accuracy, decreased procedural pain, increased response rate, and decreased cost with ultrasound guided versus blind injection.  Verbal informed consent obtained.  Time-out conducted.  Noted no overlying erythema, induration, or other signs of local infection.  Skin prepped in a  sterile fashion.  Local anesthesia:  Topical Ethyl chloride.  With sterile technique and under real time ultrasound guidance:  Into the abductor pollicis longus tendon. This was visualized with the 26-gauge 1 inch needle. Patient had a injection of 1 cc of 0.5% Marcaine and 1 cc of Kenalog 40 mg/dL. Completed without difficulty  Pain immediately resolved suggesting accurate placement of the medication.  Advised to call if fevers/chills, erythema, induration, drainage, or persistent bleeding.  Images permanently stored and available for review in the ultrasound unit.  Impression: Technically successful ultrasound guided injection.  Impression and Recommendations:     This case required medical decision making of moderate complexity.

## 2013-07-12 ENCOUNTER — Ambulatory Visit: Payer: BC Managed Care – PPO | Admitting: Family Medicine

## 2013-07-17 ENCOUNTER — Encounter: Payer: Self-pay | Admitting: Family Medicine

## 2013-07-17 ENCOUNTER — Ambulatory Visit (INDEPENDENT_AMBULATORY_CARE_PROVIDER_SITE_OTHER): Payer: BC Managed Care – PPO | Admitting: Family Medicine

## 2013-07-17 ENCOUNTER — Encounter: Payer: Self-pay | Admitting: *Deleted

## 2013-07-17 VITALS — BP 110/72 | HR 75

## 2013-07-17 DIAGNOSIS — M654 Radial styloid tenosynovitis [de Quervain]: Secondary | ICD-10-CM

## 2013-07-17 NOTE — Progress Notes (Signed)
  CC: Right wrist pain follow up  HPI: Patient is a pleasant 37 year old right-hand-dominant female come in for followup of her de Quervain's tenosynovitis. Patient did have injection, was given a brace, discussed icing protocol and was given anti-inflammatories. Patient was also put on light duty at work for the last 2 weeks. Patient states She is doing much better. Patient states 2 days after the injection she had been almost pain free. Patient has not been working because they have not had light duty. Patient is feeling like herself again. No new symptoms  Past medical, surgical, family and social history reviewed. Medications reviewed all in the electronic medical record.   Review of Systems: No headache, visual changes, nausea, vomiting, diarrhea, constipation, dizziness, abdominal pain, skin rash, fevers, chills, night sweats, weight loss, swollen lymph nodes, body aches, joint swelling, muscle aches, chest pain, shortness of breath, mood changes.   Objective:    There were no vitals taken for this visit.   General: No apparent distress alert and oriented x3 mood and affect normal, dressed appropriately.  HEENT: Pupils equal, extraocular movements intact Respiratory: Patient's speak in full sentences and does not appear short of breath Cardiovascular: No lower extremity edema, non tender, no erythema Skin: Warm dry intact with no signs of infection or rash on extremities or on axial skeleton. Abdomen: Soft nontender Neuro: Cranial nerves II through XII are intact, neurovascularly intact in all extremities with 2+ DTRs and 2+ pulses. Lymph: No lymphadenopathy of posterior or anterior cervical chain or axillae bilaterally.  Gait normal with good balance and coordination.  MSK: Non tender with full range of motion and good stability and symmetric strength and tone of shoulders, elbows, hip, knee and ankles bilaterally.  Wrist: Right Inspection normal with no visible erythema or  swelling. ROM smooth and normal with good flexion and extension and ulnar and radial deviation.  Palpation is normal over metacarpals, navicular, lunate, and TFCC; tendons without tenderness/ swelling No snuffbox tenderness. No tenderness over Canal of Guyon. Strength 5/5 in all directions without pain. Positive Finkelstein, negative tinel's and phalens. Imprved though considerably.  Negative Watson's test.    Impression and Recommendations:     This case required medical decision making of moderate complexity.

## 2013-07-17 NOTE — Patient Instructions (Signed)
You are doing great Continue icing after activity Do exercises 2 times weekly Come back if you need me.

## 2013-07-17 NOTE — Progress Notes (Signed)
Pre-visit discussion using our clinic review tool. No additional management support is needed unless otherwise documented below in the visit note.  

## 2013-07-17 NOTE — Assessment & Plan Note (Signed)
Patient is doing very well at this time. Discuss continuing the brace at night which can be beneficial. Patient is released to do full duty work in forms filled out. Patient can follow up on an as-needed basis. Discussed icing and anti-inflammatories we've exacerbations occur. Also discussed continuing home exercise program at least 2 times a week.

## 2013-07-18 ENCOUNTER — Telehealth: Payer: Self-pay | Admitting: Dietician

## 2013-07-18 NOTE — Telephone Encounter (Signed)
Emailed Shanda Bumps the Pre-Op Diet to follow starting today for two weeks before bariatric surgery.

## 2013-07-18 NOTE — Progress Notes (Signed)
Surgery scheduled for 08/01/13.  Preop on 07/26/13 at 1130am.  Need orders in EPIC.  Thank You.

## 2013-07-24 ENCOUNTER — Ambulatory Visit: Payer: BC Managed Care – PPO | Admitting: Internal Medicine

## 2013-07-25 ENCOUNTER — Encounter (HOSPITAL_COMMUNITY): Payer: Self-pay | Admitting: Pharmacy Technician

## 2013-07-25 NOTE — Patient Instructions (Addendum)
20 Anvita Hirata  07/25/2013   Your procedure is scheduled on: Wednesday December 31th  Report to [facility] at  1100 AM.  Call this number if you have problems the morning of surgery [phone]   Remember:   Do not eat food  :After Midnight.   clear liquids midnight until 700 am day of surgery, then nothing by mouth.   Take these medicines the morning of surgery with A SIP OF WATER: no medicines to take                                SEE [facility] PREPARING FOR SURGERY SHEET             You may not have any metal on your body including hair pins and piercings  Do not wear jewelry, make-up.  Do not wear lotions, powders, or perfumes. You may wear deodorant.   Men may shave face and neck.  Do not bring valuables to the hospital. [facility] IS NOT RESPONSIBLE FOR VALUEABLES.  Contacts, dentures or bridgework may not be worn into surgery.  Leave suitcase in the car. After surgery it may be brought to your room.  For patients admitted to the hospital, checkout time is 11:00 AM the day of discharge.   Patients discharged the day of surgery will not be allowed to drive home.  Name and phone number of your driver:  Special Instructions: N/A   Please read over the following fact sheets that you were given:   Call Cain Sieve RN pre op nurse if needed [phone]    FAILURE TO FOLLOW THESE INSTRUCTIONS MAY RESULT IN THE CANCELLATION OF YOUR SURGERY.  PATIENT SIGNATURE___________________________________________  NURSE SIGNATURE_____________________________________________

## 2013-07-25 NOTE — Progress Notes (Signed)
Need orders in epic asap pre op is 07-26-13 at 1130am

## 2013-07-25 NOTE — Progress Notes (Signed)
ekg 06-06-13 epic  chest xray 2 view 06-06-13 epic

## 2013-07-26 ENCOUNTER — Encounter (HOSPITAL_COMMUNITY)
Admission: RE | Admit: 2013-07-26 | Discharge: 2013-07-26 | Disposition: A | Discharge status: Home / Self Care | Payer: BC Managed Care – PPO | Source: Ambulatory Visit | Attending: General Surgery | Admitting: General Surgery

## 2013-07-26 ENCOUNTER — Encounter: Payer: BC Managed Care – PPO | Attending: General Surgery

## 2013-07-26 ENCOUNTER — Encounter (HOSPITAL_COMMUNITY): Payer: Self-pay

## 2013-07-26 DIAGNOSIS — Z01812 Encounter for preprocedural laboratory examination: Secondary | ICD-10-CM | POA: Insufficient documentation

## 2013-07-26 DIAGNOSIS — Z713 Dietary counseling and surveillance: Secondary | ICD-10-CM | POA: Insufficient documentation

## 2013-07-26 LAB — CBC
HCT: 39.8 % (ref 36.0–46.0)
MCH: 27.2 pg (ref 26.0–34.0)
MCV: 81.9 fL (ref 78.0–100.0)
Platelets: 372 10*3/uL (ref 150–400)
RBC: 4.86 MIL/uL (ref 3.87–5.11)
RDW: 14.9 % (ref 11.5–15.5)

## 2013-07-27 ENCOUNTER — Encounter (INDEPENDENT_AMBULATORY_CARE_PROVIDER_SITE_OTHER): Payer: Self-pay | Admitting: General Surgery

## 2013-07-27 ENCOUNTER — Ambulatory Visit (INDEPENDENT_AMBULATORY_CARE_PROVIDER_SITE_OTHER): Payer: BC Managed Care – PPO | Admitting: General Surgery

## 2013-07-27 NOTE — Progress Notes (Signed)
Pre-Operative Nutrition Class:  Appt start time: 0830   End time:  1030  Patient was seen on 07/26/2013 for Pre-Operative Bariatric Surgery Education at [facility].   Surgery date: 08/08/2013 Surgery type: Gastric Sleeve  The following the learning objectives were met by the patient during this course:  Identify Pre-Op Dietary Goals and will begin 2 weeks pre-operatively  Identify appropriate sources of fluids and proteins   State protein recommendations and appropriate sources pre and post-operatively  Identify Post-Operative Dietary Goals and will follow for 2 weeks post-operatively  Identify appropriate multivitamin and calcium sources  Describe the need for physical activity post-operatively and will follow MD recommendations  State when to call healthcare provider regarding medication questions or post-operative complications  Handouts given during class include:  Pre-Op Bariatric Surgery Diet Handout  Protein Shake Handout  Post-Op Bariatric Surgery Nutrition Handout  [facility] Program Information Flyer  Support Group Information Flyer  [facility] Bariatric Supplements Price List  Follow-Up Plan: Patient will follow-up at [facility] 2 weeks post operatively for diet advancement per MD.

## 2013-07-27 NOTE — Progress Notes (Signed)
Chief complaint: Preop sleeve gastrectomy  History: Patient returns for her preop visit prior to planned sleeve gastrectomy for progressive morbid obesity. She has successfully completed her preoperative workup. There were no concerns raised on psychologic or nutrition evaluation. Upper GI series showed no hiatal hernia. Lab work was unremarkable. She has had no significant illness since her original presentation.  Past Medical History  Diagnosis Date  . Asthma     as teenager, none now   Past Surgical History  Procedure Laterality Date  . Tubal ligation  2001   No current outpatient prescriptions on file.   No current facility-administered medications for this visit.   Allergies  Allergen Reactions  . Latex Hives   Exam: BP 128/80  Pulse 76  Temp(Src) 98.2 F (36.8 C) (Temporal)  Resp 14  Ht 5\' 4"  (1.626 m)  Wt 254 lb 6.4 oz (115.395 kg)  BMI 43.65 kg/m2  LMP 07/12/2013 General: Obese but otherwise well-appearing Skin: No rash or infection HEENT: No masses. Sclera nonicteric Lungs: Clear equal breath sounds without increased work of breathing Cardiac: Regular rate and rhythm. No edema. Abdomen: Obese soft and nontender. No hernias or organomegaly. Extremities: No edema Neurologic: Alert and oriented. Gait normal.  Assessment plan: For her to proceed with laparoscopic sleeve gastrectomy. We reviewed the consent form line by line and all her questions were answered. She'll start her preoperative diet.

## 2013-08-07 ENCOUNTER — Other Ambulatory Visit (INDEPENDENT_AMBULATORY_CARE_PROVIDER_SITE_OTHER): Payer: Self-pay | Admitting: General Surgery

## 2013-08-07 NOTE — Progress Notes (Signed)
Paged Dr. Johna Sheriff for orders.

## 2013-08-08 ENCOUNTER — Inpatient Hospital Stay (HOSPITAL_COMMUNITY)
Admission: RE | Admit: 2013-08-08 | Discharge: 2013-08-10 | DRG: 621 | Disposition: A | Discharge status: Home / Self Care | Payer: BC Managed Care – PPO | Source: Ambulatory Visit | Attending: General Surgery | Admitting: General Surgery

## 2013-08-08 ENCOUNTER — Encounter (HOSPITAL_COMMUNITY): Payer: Self-pay | Admitting: *Deleted

## 2013-08-08 ENCOUNTER — Encounter (HOSPITAL_COMMUNITY)
Admission: RE | Disposition: A | Discharge status: Home / Self Care | Payer: Self-pay | Source: Ambulatory Visit | Attending: General Surgery

## 2013-08-08 ENCOUNTER — Encounter (HOSPITAL_COMMUNITY): Payer: BC Managed Care – PPO | Admitting: Certified Registered Nurse Anesthetist

## 2013-08-08 ENCOUNTER — Ambulatory Visit (HOSPITAL_COMMUNITY): Payer: BC Managed Care – PPO | Admitting: Certified Registered Nurse Anesthetist

## 2013-08-08 DIAGNOSIS — R109 Unspecified abdominal pain: Secondary | ICD-10-CM | POA: Diagnosis not present

## 2013-08-08 DIAGNOSIS — R252 Cramp and spasm: Secondary | ICD-10-CM | POA: Diagnosis not present

## 2013-08-08 DIAGNOSIS — Z01812 Encounter for preprocedural laboratory examination: Secondary | ICD-10-CM

## 2013-08-08 DIAGNOSIS — Z6841 Body Mass Index (BMI) 40.0 and over, adult: Secondary | ICD-10-CM

## 2013-08-08 HISTORY — PX: LAPAROSCOPIC GASTRIC SLEEVE RESECTION: SHX5895

## 2013-08-08 HISTORY — PX: UPPER GI ENDOSCOPY: SHX6162

## 2013-08-08 LAB — PREGNANCY, URINE: Preg Test, Ur: NEGATIVE

## 2013-08-08 LAB — COMPREHENSIVE METABOLIC PANEL
Albumin: 3.6 g/dL (ref 3.5–5.2)
Alkaline Phosphatase: 71 U/L (ref 39–117)
BUN: 15 mg/dL (ref 6–23)
CO2: 25 mEq/L (ref 19–32)
Calcium: 9.1 mg/dL (ref 8.4–10.5)
Creatinine, Ser: 0.78 mg/dL (ref 0.50–1.10)
GFR calc Af Amer: >90 mL/min (ref >90)
Glucose, Bld: 99 mg/dL (ref 70–99)
Potassium: 4.3 mEq/L (ref 3.7–5.3)
Sodium: 135 mEq/L — ABNORMAL LOW (ref 137–147)
Total Bilirubin: 0.7 mg/dL (ref 0.3–1.2)
Total Protein: 7.3 g/dL (ref 6.0–8.3)

## 2013-08-08 LAB — CBC WITH DIFFERENTIAL/PLATELET
Basophils Relative: 1 % (ref 0–1)
Eosinophils Absolute: 0.3 10*3/uL (ref 0.0–0.7)
Eosinophils Relative: 4 % (ref 0–5)
Hemoglobin: 13.6 g/dL (ref 12.0–15.0)
Lymphs Abs: 2.8 10*3/uL (ref 0.7–4.0)
MCH: 27.5 pg (ref 26.0–34.0)
MCHC: 33.9 g/dL (ref 30.0–36.0)
MCV: 81.2 fL (ref 78.0–100.0)
Monocytes Relative: 7 % (ref 3–12)
Neutrophils Relative %: 50 % (ref 43–77)
Platelets: 306 10*3/uL (ref 150–400)

## 2013-08-08 LAB — HEMOGLOBIN AND HEMATOCRIT, BLOOD: Hemoglobin: 12.6 g/dL (ref 12.0–15.0)

## 2013-08-08 SURGERY — GASTRECTOMY, SLEEVE, LAPAROSCOPIC
Anesthesia: General | Site: Esophagus

## 2013-08-08 MED ORDER — UNJURY CHICKEN SOUP POWDER: 2.0000 [oz_av] | Freq: Four times a day (QID) | ORAL | Status: DC

## 2013-08-08 MED ORDER — FENTANYL CITRATE 0.05 MG/ML IJ SOLN: 25.0000 ug | INTRAMUSCULAR | Status: DC | PRN

## 2013-08-08 MED ORDER — LACTATED RINGERS IV SOLN
INTRAVENOUS | Status: DC
  Administered 2013-08-08: 1000 mL via INTRAVENOUS
  Administered 2013-08-08 (×2): via INTRAVENOUS

## 2013-08-08 MED ORDER — HEPARIN SODIUM (PORCINE) 5000 UNIT/ML IJ SOLN
5000.0000 [IU] | Freq: Three times a day (TID) | INTRAMUSCULAR | Status: DC
  Administered 2013-08-08 – 2013-08-09 (×3): 5000 [IU] via SUBCUTANEOUS
  Filled 2013-08-08 (×5): qty 1

## 2013-08-08 MED ORDER — BUPIVACAINE-EPINEPHRINE 0.5% -1:200000 IJ SOLN
INTRAMUSCULAR | Status: DC | PRN
  Administered 2013-08-08: 50 mL

## 2013-08-08 MED ORDER — PROPOFOL 10 MG/ML IV BOLUS
INTRAVENOUS | Status: DC | PRN
  Administered 2013-08-08: 200 mg via INTRAVENOUS

## 2013-08-08 MED ORDER — TISSEEL VH 10 ML EX KIT
PACK | CUTANEOUS | Status: AC
  Filled 2013-08-08: qty 1

## 2013-08-08 MED ORDER — ONDANSETRON HCL 4 MG/2ML IJ SOLN
4.0000 mg | INTRAMUSCULAR | Status: DC | PRN
  Administered 2013-08-08 – 2013-08-09 (×3): 4 mg via INTRAVENOUS
  Filled 2013-08-08 (×3): qty 2

## 2013-08-08 MED ORDER — MEPERIDINE HCL 50 MG/ML IJ SOLN: 6.2500 mg | INTRAMUSCULAR | Status: DC | PRN

## 2013-08-08 MED ORDER — FENTANYL CITRATE 0.05 MG/ML IJ SOLN
INTRAMUSCULAR | Status: AC
  Filled 2013-08-08: qty 5

## 2013-08-08 MED ORDER — ONDANSETRON HCL 4 MG/2ML IJ SOLN
INTRAMUSCULAR | Status: AC
  Filled 2013-08-08: qty 2

## 2013-08-08 MED ORDER — HEPARIN SODIUM (PORCINE) 5000 UNIT/ML IJ SOLN
5000.0000 [IU] | INTRAMUSCULAR | Status: AC
  Administered 2013-08-08: 5000 [IU] via SUBCUTANEOUS
  Filled 2013-08-08: qty 1

## 2013-08-08 MED ORDER — GLYCOPYRROLATE 0.2 MG/ML IJ SOLN
INTRAMUSCULAR | Status: AC
  Filled 2013-08-08: qty 3

## 2013-08-08 MED ORDER — ROCURONIUM BROMIDE 100 MG/10ML IV SOLN
INTRAVENOUS | Status: DC | PRN
  Administered 2013-08-08: 60 mg via INTRAVENOUS

## 2013-08-08 MED ORDER — GLYCOPYRROLATE 0.2 MG/ML IJ SOLN
INTRAMUSCULAR | Status: DC | PRN
  Administered 2013-08-08: 0.6 mg via INTRAVENOUS

## 2013-08-08 MED ORDER — ONDANSETRON HCL 4 MG/2ML IJ SOLN
INTRAMUSCULAR | Status: DC | PRN
  Administered 2013-08-08: 4 mg via INTRAVENOUS

## 2013-08-08 MED ORDER — PROPOFOL 10 MG/ML IV BOLUS
INTRAVENOUS | Status: AC
  Filled 2013-08-08: qty 20

## 2013-08-08 MED ORDER — DIPHENHYDRAMINE HCL 50 MG/ML IJ SOLN
INTRAMUSCULAR | Status: AC
  Filled 2013-08-08: qty 1

## 2013-08-08 MED ORDER — ACETAMINOPHEN 160 MG/5ML PO SOLN: 650.0000 mg | ORAL | Status: DC | PRN

## 2013-08-08 MED ORDER — BUPIVACAINE-EPINEPHRINE PF 0.5-1:200000 % IJ SOLN
INTRAMUSCULAR | Status: AC
  Filled 2013-08-08: qty 30

## 2013-08-08 MED ORDER — METOCLOPRAMIDE HCL 5 MG/ML IJ SOLN
INTRAMUSCULAR | Status: AC
  Filled 2013-08-08: qty 2

## 2013-08-08 MED ORDER — UNJURY CHOCOLATE CLASSIC POWDER: 2.0000 [oz_av] | Freq: Four times a day (QID) | ORAL | Status: DC

## 2013-08-08 MED ORDER — LIDOCAINE HCL (CARDIAC) 20 MG/ML IV SOLN
INTRAVENOUS | Status: DC | PRN
  Administered 2013-08-08: 80 mg via INTRAVENOUS

## 2013-08-08 MED ORDER — FENTANYL CITRATE 0.05 MG/ML IJ SOLN
INTRAMUSCULAR | Status: AC
  Filled 2013-08-08: qty 2

## 2013-08-08 MED ORDER — SCOPOLAMINE 1 MG/3DAYS TD PT72
MEDICATED_PATCH | TRANSDERMAL | Status: AC
  Filled 2013-08-08: qty 1

## 2013-08-08 MED ORDER — LACTATED RINGERS IV SOLN: INTRAVENOUS | Status: DC

## 2013-08-08 MED ORDER — HYDROMORPHONE HCL PF 2 MG/ML IJ SOLN
INTRAMUSCULAR | Status: AC
  Filled 2013-08-08: qty 1

## 2013-08-08 MED ORDER — TISSEEL VH 10 ML EX KIT
PACK | CUTANEOUS | Status: DC | PRN
  Administered 2013-08-08: 1

## 2013-08-08 MED ORDER — HYDROMORPHONE HCL PF 1 MG/ML IJ SOLN
INTRAMUSCULAR | Status: DC | PRN
  Administered 2013-08-08 (×3): 1 mg via INTRAVENOUS

## 2013-08-08 MED ORDER — ROCURONIUM BROMIDE 100 MG/10ML IV SOLN
INTRAVENOUS | Status: AC
  Filled 2013-08-08: qty 1

## 2013-08-08 MED ORDER — OXYCODONE-ACETAMINOPHEN 5-325 MG/5ML PO SOLN
5.0000 mL | ORAL | Status: DC | PRN
  Administered 2013-08-09 (×2): 10 mL via ORAL
  Administered 2013-08-10: 5 mL via ORAL
  Administered 2013-08-10: 10 mL via ORAL
  Filled 2013-08-08 (×4): qty 10

## 2013-08-08 MED ORDER — STERILE WATER FOR IRRIGATION IR SOLN
Status: DC | PRN
  Administered 2013-08-08: 1500 mL

## 2013-08-08 MED ORDER — DEXAMETHASONE SODIUM PHOSPHATE 10 MG/ML IJ SOLN
INTRAMUSCULAR | Status: DC | PRN
  Administered 2013-08-08: 10 mg via INTRAVENOUS

## 2013-08-08 MED ORDER — MORPHINE SULFATE 2 MG/ML IJ SOLN
2.0000 mg | INTRAMUSCULAR | Status: DC | PRN
  Filled 2013-08-08: qty 1

## 2013-08-08 MED ORDER — HYDROMORPHONE HCL PF 1 MG/ML IJ SOLN
0.5000 mg | INTRAMUSCULAR | Status: DC | PRN
  Administered 2013-08-09 (×3): 0.5 mg via INTRAVENOUS
  Filled 2013-08-08 (×3): qty 1

## 2013-08-08 MED ORDER — DEXTROSE 5 % IV SOLN
2.0000 g | INTRAVENOUS | Status: AC
  Administered 2013-08-08: 2 g via INTRAVENOUS
  Filled 2013-08-08: qty 2

## 2013-08-08 MED ORDER — LACTATED RINGERS IR SOLN
Status: DC | PRN
  Administered 2013-08-08: 1

## 2013-08-08 MED ORDER — METOCLOPRAMIDE HCL 5 MG/ML IJ SOLN
INTRAMUSCULAR | Status: DC | PRN
  Administered 2013-08-08: 10 mg via INTRAVENOUS

## 2013-08-08 MED ORDER — 0.9 % SODIUM CHLORIDE (POUR BTL) OPTIME
TOPICAL | Status: DC | PRN
  Administered 2013-08-08: 1000 mL

## 2013-08-08 MED ORDER — PROMETHAZINE HCL 25 MG/ML IJ SOLN: 6.2500 mg | INTRAMUSCULAR | Status: DC | PRN

## 2013-08-08 MED ORDER — DIPHENHYDRAMINE HCL 50 MG/ML IJ SOLN
25.0000 mg | Freq: Once | INTRAMUSCULAR | Status: AC
  Administered 2013-08-08: 25 mg via INTRAVENOUS

## 2013-08-08 MED ORDER — MIDAZOLAM HCL 2 MG/2ML IJ SOLN
INTRAMUSCULAR | Status: AC
  Filled 2013-08-08: qty 2

## 2013-08-08 MED ORDER — MIDAZOLAM HCL 5 MG/5ML IJ SOLN
INTRAMUSCULAR | Status: DC | PRN
  Administered 2013-08-08: 2 mg via INTRAVENOUS

## 2013-08-08 MED ORDER — SUCCINYLCHOLINE CHLORIDE 20 MG/ML IJ SOLN
INTRAMUSCULAR | Status: DC | PRN
  Administered 2013-08-08: 100 mg via INTRAVENOUS

## 2013-08-08 MED ORDER — SCOPOLAMINE 1 MG/3DAYS TD PT72
MEDICATED_PATCH | TRANSDERMAL | Status: DC | PRN
  Administered 2013-08-08: 1 via TRANSDERMAL

## 2013-08-08 MED ORDER — DEXTROSE 5 % IV SOLN
INTRAVENOUS | Status: AC
  Filled 2013-08-08: qty 1

## 2013-08-08 MED ORDER — UNJURY VANILLA POWDER
2.0000 [oz_av] | Freq: Four times a day (QID) | ORAL | Status: DC
  Administered 2013-08-10 (×2): 2 [oz_av] via ORAL

## 2013-08-08 MED ORDER — NEOSTIGMINE METHYLSULFATE 1 MG/ML IJ SOLN
INTRAMUSCULAR | Status: DC | PRN
  Administered 2013-08-08: 5 mg via INTRAVENOUS

## 2013-08-08 MED ORDER — KCL IN DEXTROSE-NACL 20-5-0.9 MEQ/L-%-% IV SOLN
INTRAVENOUS | Status: DC
  Administered 2013-08-08 – 2013-08-10 (×4): via INTRAVENOUS
  Filled 2013-08-08 (×5): qty 1000

## 2013-08-08 MED ORDER — DEXAMETHASONE SODIUM PHOSPHATE 10 MG/ML IJ SOLN
INTRAMUSCULAR | Status: AC
  Filled 2013-08-08: qty 1

## 2013-08-08 MED ORDER — FENTANYL CITRATE 0.05 MG/ML IJ SOLN
INTRAMUSCULAR | Status: DC | PRN
  Administered 2013-08-08: 100 ug via INTRAVENOUS
  Administered 2013-08-08: 50 ug via INTRAVENOUS
  Administered 2013-08-08: 150 ug via INTRAVENOUS
  Administered 2013-08-08 (×2): 100 ug via INTRAVENOUS

## 2013-08-08 SURGICAL SUPPLY — 61 items
ADH SKN CLS APL DERMABOND .7 (GAUZE/BANDAGES/DRESSINGS) ×2
APL SRG 32X5 SNPLK LF DISP (MISCELLANEOUS) ×2
APPLICATOR COTTON TIP 6IN STRL (MISCELLANEOUS) IMPLANT
APPLIER CLIP ROT 10 11.4 M/L (STAPLE)
APPLIER CLIP ROT 13.4 12 LRG (CLIP)
APR CLP LRG 13.4X12 ROT 20 MLT (CLIP)
APR CLP MED LRG 11.4X10 (STAPLE)
BAG SPEC RTRVL LRG 6X4 10 (ENDOMECHANICALS)
BLADE SURG SZ11 CARB STEEL (BLADE) ×3 IMPLANT
CABLE HIGH FREQUENCY MONO STRZ (ELECTRODE) ×1 IMPLANT
CANISTER SUCTION 2500CC (MISCELLANEOUS) ×3 IMPLANT
CATH FOLEY LATEX FREE 16FR (CATHETERS) ×1 IMPLANT
CHLORAPREP W/TINT 26ML (MISCELLANEOUS) ×4 IMPLANT
CLIP APPLIE ROT 10 11.4 M/L (STAPLE) IMPLANT
CLIP APPLIE ROT 13.4 12 LRG (CLIP) IMPLANT
DERMABOND ADVANCED (GAUZE/BANDAGES/DRESSINGS) ×1
DERMABOND ADVANCED .7 DNX12 (GAUZE/BANDAGES/DRESSINGS) ×2 IMPLANT
DEVICE SUTURE ENDOST 10MM (ENDOMECHANICALS) IMPLANT
DEVICE TROCAR PUNCTURE CLOSURE (ENDOMECHANICALS) ×1 IMPLANT
DRAPE CAMERA CLOSED 9X96 (DRAPES) ×3 IMPLANT
DRAPE UTILITY XL STRL (DRAPES) ×6 IMPLANT
ELECT REM PT RETURN 9FT ADLT (ELECTROSURGICAL) ×3
ELECTRODE REM PT RTRN 9FT ADLT (ELECTROSURGICAL) ×2 IMPLANT
GLOVE BIOGEL PI IND STRL 6.5 (GLOVE) IMPLANT
GLOVE BIOGEL PI IND STRL 7.5 (GLOVE) ×2 IMPLANT
GLOVE BIOGEL PI IND STRL 8 (GLOVE) IMPLANT
GLOVE BIOGEL PI INDICATOR 6.5 (GLOVE) ×1
GLOVE BIOGEL PI INDICATOR 7.5 (GLOVE) ×2
GLOVE BIOGEL PI INDICATOR 8 (GLOVE) ×1
GOWN STRL REIN XL XLG (GOWN DISPOSABLE) ×15 IMPLANT
HOVERMATT SINGLE USE (MISCELLANEOUS) ×3 IMPLANT
KIT BASIN OR (CUSTOM PROCEDURE TRAY) ×3 IMPLANT
NDL SPNL 22GX3.5 QUINCKE BK (NEEDLE) ×2 IMPLANT
NEEDLE SPNL 22GX3.5 QUINCKE BK (NEEDLE) ×3 IMPLANT
PACK UNIVERSAL I (CUSTOM PROCEDURE TRAY) ×3 IMPLANT
PENCIL BUTTON HOLSTER BLD 10FT (ELECTRODE) ×3 IMPLANT
POUCH SPECIMEN RETRIEVAL 10MM (ENDOMECHANICALS) IMPLANT
RELOAD BLUE (STAPLE) ×8 IMPLANT
RELOAD GOLD (STAPLE) ×3 IMPLANT
RELOAD GREEN (STAPLE) ×3 IMPLANT
SCISSORS LAP 5X35 DISP (ENDOMECHANICALS) IMPLANT
SEALANT SURGICAL APPL DUAL CAN (MISCELLANEOUS) ×3 IMPLANT
SET IRRIG TUBING LAPAROSCOPIC (IRRIGATION / IRRIGATOR) ×3 IMPLANT
SHEARS CURVED HARMONIC AC 45CM (MISCELLANEOUS) ×3 IMPLANT
SLEEVE ADV FIXATION 5X100MM (TROCAR) ×3 IMPLANT
SLEEVE GASTRECTOMY 36FR VISIGI (MISCELLANEOUS) ×4 IMPLANT
SOLUTION ANTI FOG 6CC (MISCELLANEOUS) ×3 IMPLANT
SPONGE GAUZE 4X4 12PLY (GAUZE/BANDAGES/DRESSINGS) IMPLANT
SPONGE LAP 18X18 X RAY DECT (DISPOSABLE) ×3 IMPLANT
STAPLE ECHEON FLEX 60 POW ENDO (STAPLE) ×3 IMPLANT
SUT ETHILON 2 0 PS N (SUTURE) IMPLANT
SUT MNCRL AB 4-0 PS2 18 (SUTURE) ×3 IMPLANT
SUT VICRYL 0 UR6 27IN ABS (SUTURE) ×1 IMPLANT
SYR 20CC LL (SYRINGE) ×3 IMPLANT
TOWEL OR NON WOVEN STRL DISP B (DISPOSABLE) ×3 IMPLANT
TROCAR ADV FIXATION 5X100MM (TROCAR) ×3 IMPLANT
TROCAR BLADELESS 15MM (ENDOMECHANICALS) ×3 IMPLANT
TROCAR XCEL NON-BLD 5MMX100MML (ENDOMECHANICALS) ×3 IMPLANT
TUBING CONNECTING 10 (TUBING) ×3 IMPLANT
TUBING ENDO SMARTCAP PENTAX (MISCELLANEOUS) ×3 IMPLANT
TUBING FILTER THERMOFLATOR (ELECTROSURGICAL) ×3 IMPLANT

## 2013-08-08 NOTE — H&P (View-Only) (Signed)
Chief complaint: Preop sleeve gastrectomy  History: Patient returns for her preop visit prior to planned sleeve gastrectomy for progressive morbid obesity. She has successfully completed her preoperative workup. There were no concerns raised on psychologic or nutrition evaluation. Upper GI series showed no hiatal hernia. Lab work was unremarkable. She has had no significant illness since her original presentation.  Past Medical History  Diagnosis Date  . Asthma     as teenager, none now   Past Surgical History  Procedure Laterality Date  . Tubal ligation  2001   No current outpatient prescriptions on file.   No current facility-administered medications for this visit.   Allergies  Allergen Reactions  . Latex Hives   Exam: BP 128/80  Pulse 76  Temp(Src) 98.2 F (36.8 C) (Temporal)  Resp 14  Ht 5' 4" (1.626 m)  Wt 254 lb 6.4 oz (115.395 kg)  BMI 43.65 kg/m2  LMP 07/12/2013 General: Obese but otherwise well-appearing Skin: No rash or infection HEENT: No masses. Sclera nonicteric Lungs: Clear equal breath sounds without increased work of breathing Cardiac: Regular rate and rhythm. No edema. Abdomen: Obese soft and nontender. No hernias or organomegaly. Extremities: No edema Neurologic: Alert and oriented. Gait normal.  Assessment plan: For her to proceed with laparoscopic sleeve gastrectomy. We reviewed the consent form line by line and all her questions were answered. She'll start her preoperative diet. 

## 2013-08-08 NOTE — Transfer of Care (Signed)
Immediate Anesthesia Transfer of Care Note  Patient: Robyn Bryant  Procedure(s) Performed: Procedure(s): LAPAROSCOPIC GASTRIC SLEEVE RESECTION UPPER ENDOSCOPY (N/A) UPPER GI ENDOSCOPY  Patient Location: PACU  Anesthesia Type:General  Level of Consciousness: awake and alert   Airway & Oxygen Therapy: Patient Spontanous Breathing and Patient connected to face mask oxygen  Post-op Assessment: Report given to PACU RN and Post -op Vital signs reviewed and stable  Post vital signs: Reviewed and stable  Complications: No apparent anesthesia complications

## 2013-08-08 NOTE — Interval H&P Note (Signed)
History and Physical Interval Note:  08/08/2013 12:13 PM  Robyn Bryant  has presented today for surgery, with the diagnosis of morbid obesity   The various methods of treatment have been discussed with the patient and family. After consideration of risks, benefits and other options for treatment, the patient has consented to  Procedure(s): LAPAROSCOPIC GASTRIC SLEEVE RESECTION (N/A) as a surgical intervention .  The patient's history has been reviewed, patient examined, no change in status, stable for surgery.  I have reviewed the patient's chart and labs.  Questions were answered to the patient's satisfaction.     Victoriano Campion T

## 2013-08-08 NOTE — Progress Notes (Signed)
While giving pt Morphine for pain pt developed stomach cramps and chills.Robyn KitchenMarland KitchenMarland KitchenZofran 4 mg given IV with little effect. Will page MD

## 2013-08-08 NOTE — Anesthesia Preprocedure Evaluation (Signed)
Anesthesia Evaluation  Patient identified by MRN, date of birth, ID band Patient awake    Reviewed: Allergy & Precautions, H&P , NPO status , Patient's Chart, lab work & pertinent test results  Airway Mallampati: II TM Distance: >3 FB Neck ROM: Full    Dental no notable dental hx.    Pulmonary neg pulmonary ROS,  breath sounds clear to auscultation  Pulmonary exam normal       Cardiovascular negative cardio ROS  Rhythm:Regular Rate:Normal     Neuro/Psych negative neurological ROS  negative psych ROS   GI/Hepatic negative GI ROS, Neg liver ROS,   Endo/Other  negative endocrine ROSMorbid obesity  Renal/GU negative Renal ROS  negative genitourinary   Musculoskeletal negative musculoskeletal ROS (+)   Abdominal   Peds negative pediatric ROS (+)  Hematology negative hematology ROS (+)   Anesthesia Other Findings   Reproductive/Obstetrics negative OB ROS                           Anesthesia Physical Anesthesia Plan  ASA: III  Anesthesia Plan: General   Post-op Pain Management:    Induction: Intravenous  Airway Management Planned: Oral ETT  Additional Equipment:   Intra-op Plan:   Post-operative Plan: Extubation in OR  Informed Consent: I have reviewed the patients History and Physical, chart, labs and discussed the procedure including the risks, benefits and alternatives for the proposed anesthesia with the patient or authorized representative who has indicated his/her understanding and acceptance.   Dental advisory given  Plan Discussed with: CRNA  Anesthesia Plan Comments:         Anesthesia Quick Evaluation  

## 2013-08-08 NOTE — Anesthesia Postprocedure Evaluation (Signed)
  Anesthesia Post-op Note  Patient: Robyn Bryant  Procedure(s) Performed: Procedure(s) (LRB): LAPAROSCOPIC GASTRIC SLEEVE RESECTION UPPER ENDOSCOPY (N/A) UPPER GI ENDOSCOPY  Patient Location: PACU  Anesthesia Type: General  Level of Consciousness: awake and alert   Airway and Oxygen Therapy: Patient Spontanous Breathing  Post-op Pain: mild  Post-op Assessment: Post-op Vital signs reviewed, Patient's Cardiovascular Status Stable, Respiratory Function Stable, Patent Airway and No signs of Nausea or vomiting  Last Vitals:  Filed Vitals:   08/08/13 1445  BP:   Pulse:   Temp: 36.3 C  Resp: 12    Post-op Vital Signs: stable   Complications: No apparent anesthesia complications

## 2013-08-08 NOTE — Op Note (Signed)
Preoperative Diagnosis: morbid obesity   Postoprative Diagnosis: morbid obesity   Procedure: Procedure(s): LAPAROSCOPIC GASTRIC SLEEVE RESECTION UPPER ENDOSCOPY UPPER GI ENDOSCOPY   Surgeon: Glenna Fellows T   Assistants: Luretha Murphy  Anesthesia:  General endotracheal anesthesia  Indications: patient is a 37 year old female with progressive morbid obesity unresponsive to multiple attempts at medical management. After an extensive preoperative workup and discussion detailed elsewhere we have elected to proceed with laparoscopic sleeve gastrectomy for treatment of her morbid obesity.  Procedure Detail: patient was brought to the operating room, placed in the supine position on the operating table and general endotracheal anesthesia induced. She had received preoperative IV antibiotics and subcutaneous heparin. PAS were in place. The abdomen was widely sterilely prepped and draped. Patient timeout was performed and correct procedure verified. Access was obtained a 5 mm Optiview trocar in the left upper quadrant midclavicular line without difficulty and pneumoperitoneum established. There was no evidence of trocar injury. Under direct vision a 5 mm trocar was placed laterally in the right upper quadrant, a 15 mm trocar in the right upper abdomen at the base of the falciform ligament and a 5 mm trocar just above and to the left of the umbilicus for the camera port. A 5 mm subxiphoid site the Nathanson retractor was placed in the left lobe of the liver elevated with excellent exposure of the entire stomach and hiatus. A 5 cm carefully measured from the pylorus proximally on the greater curve of the stomach at this point the lesser omentum was divided along the greater curve with the harmonic scalpel and the lesser sac entered without difficulty. The dissection then progressed proximally along the greater curve dividing the lesser omentum and short gastric vessels working up toward the fundus. The  fundus was completely mobilized off of the spleen all short gastric vessels divided. The left crus was completely cleared of the hiatus and defined. There was no evidence of hiatal hernia. Finally diffuse avascular posterior attachments of the stomach along the pancreas were divided until the stomach was completely freed down to its lesser curve vasculature. The VisiG was inserted orally into the stomach advanced this tip down to the pylorus and was positioned along the lesser curve of the stomach symmetrically positioned and suction applied with good visualization and definition of the tube. Beginning 5 cm from the pylorus an initial firing of the green load echelon 60 mm stapler was performed angling up and away from the angularis. Following this a single firing of the echelon 60 mm gold load stapler was performed up past the angularis allowing some extra room around the tube. After this several firings of the blue load echelon 60 mm stapler were used to complete the sleeve staying up against the tube up to the hiatus where we angled out laterally but the final firing being lateral to the fat pad. The staple line was very symmetric and intact and without bleeding. The sleeve was insufflated under saline irrigation there was no evidence of leak. The tube was removed. Dr. Daphine Deutscher performed upper endoscopy with no evidence of bleeding or stricture or other problem. The gastric specimen was then removed through the 15 mm trocar site after dilating the fascia slightly. Tisseel tissue sealant was used to coat the entire staple line. There was no evidence of bleeding or trocar injury or other problems. The 15 mm fascial defect was closed with 0 Vicryl with the Endo Close. The Nathanson retractor was removed under direct vision all CO2 evacuated and trochars removed.  Skin incisions were closed with subcuticular Monocryl and Dermabond. Sponge needle and instrument counts were correct.    Estimated Blood Loss:  Minimal          Drains: none  Blood Given: none          Specimens: greater curve of stomach        Complications:  * No complications entered in OR log *         Disposition: PACU - hemodynamically stable.         Condition: stable

## 2013-08-09 ENCOUNTER — Observation Stay (HOSPITAL_COMMUNITY): Payer: BC Managed Care – PPO

## 2013-08-09 LAB — CBC WITH DIFFERENTIAL/PLATELET
Basophils Absolute: 0 10*3/uL (ref 0.0–0.1)
Basophils Relative: 0 % (ref 0–1)
Eosinophils Absolute: 0 10*3/uL (ref 0.0–0.7)
Eosinophils Relative: 0 % (ref 0–5)
HCT: 37.6 % (ref 36.0–46.0)
Hemoglobin: 12.3 g/dL (ref 12.0–15.0)
Lymphocytes Relative: 18 % (ref 12–46)
Lymphs Abs: 1.6 10*3/uL (ref 0.7–4.0)
MCH: 26.8 pg (ref 26.0–34.0)
MCHC: 32.7 g/dL (ref 30.0–36.0)
MCV: 81.9 fL (ref 78.0–100.0)
Monocytes Absolute: 0.7 10*3/uL (ref 0.1–1.0)
Monocytes Relative: 8 % (ref 3–12)
Neutro Abs: 6.3 10*3/uL (ref 1.7–7.7)
Neutrophils Relative %: 74 % (ref 43–77)
Platelets: 314 10*3/uL (ref 150–400)
RBC: 4.59 MIL/uL (ref 3.87–5.11)
RDW: 14.6 % (ref 11.5–15.5)
WBC: 8.6 10*3/uL (ref 4.0–10.5)

## 2013-08-09 LAB — HEMOGLOBIN AND HEMATOCRIT, BLOOD
HCT: 37 % (ref 36.0–46.0)
Hemoglobin: 12.2 g/dL (ref 12.0–15.0)

## 2013-08-09 MED ORDER — FENTANYL CITRATE 0.05 MG/ML IJ SOLN: 25.0000 ug | INTRAMUSCULAR | Status: DC | PRN

## 2013-08-09 MED ORDER — ENOXAPARIN SODIUM 60 MG/0.6ML ~~LOC~~ SOLN
60.0000 mg | SUBCUTANEOUS | Status: DC
  Administered 2013-08-09: 60 mg via SUBCUTANEOUS
  Filled 2013-08-09 (×2): qty 0.6

## 2013-08-09 MED ORDER — IOHEXOL 300 MG/ML  SOLN
50.0000 mL | Freq: Once | INTRAMUSCULAR | Status: AC | PRN
  Administered 2013-08-09: 50 mL via ORAL

## 2013-08-09 MED ORDER — DIPHENHYDRAMINE HCL 50 MG/ML IJ SOLN
12.5000 mg | Freq: Four times a day (QID) | INTRAMUSCULAR | Status: DC | PRN
  Administered 2013-08-09: 12.5 mg via INTRAVENOUS
  Administered 2013-08-09: 25 mg via INTRAVENOUS
  Filled 2013-08-09 (×2): qty 1

## 2013-08-09 MED ORDER — ALUM & MAG HYDROXIDE-SIMETH 200-200-20 MG/5ML PO SUSP
30.0000 mL | Freq: Four times a day (QID) | ORAL | Status: DC | PRN
  Administered 2013-08-09: 30 mL via ORAL
  Filled 2013-08-09: qty 30

## 2013-08-09 MED ORDER — DIPHENHYDRAMINE HCL 25 MG PO CAPS: 25.0000 mg | ORAL_CAPSULE | Freq: Four times a day (QID) | ORAL | Status: DC | PRN

## 2013-08-09 NOTE — Progress Notes (Signed)
Robyn Bryant [id number] [birth year]  CARE TEAM:  PCP: Scarlette Calico, MD  Outpatient Care Team: Patient Care Team: Janith Lima, MD as PCP - General (Internal Medicine)  Inpatient Treatment Team: Treatment Team: Attending Provider: Edward Jolly, MD; Registered Nurse: Florian Buff, RN; Registered Nurse: Delrae Sawyers, RN   Subjective:  Abdominal cramps of morphine.  Switched to Dilaudid.  Patient now notes itching to Dilaudid Swallow study done.  No leak to my evaluation.  No obstruction.  Objective:  Vital signs:  Filed Vitals:   08/08/13 1655 08/08/13 2140 08/09/13 0205 08/09/13 0521  BP: 129/59 114/67 137/76 115/70  Pulse: 78 72 65 74  Temp: 97.8 F (36.6 C) 97.9 F (36.6 C) 98.1 F (36.7 C) 98.9 F (37.2 C)  TempSrc: Oral Oral Oral Oral  Resp: '18 18 18 18  ' Height:      Weight:      SpO2: 99% 93% 100% 100%    Last BM Date: 08/08/13  Intake/Output   Yesterday:  12/31 0701 - 01/01 0700 In: 3210 [I.V.:3210] Out: 160 [Urine:160] This shift:  Total I/O In: -  Out: 200 [Urine:200]  Bowel function:  Flatus: n  BM: n  Drain: n/a  Physical Exam:  General: Pt awake/alert/oriented x4 in no acute distress Eyes: PERRL, normal EOM.  Sclera clear.  No icterus Neuro: CN II-XII intact w/o focal sensory/motor deficits. Lymph: No head/neck/groin lymphadenopathy Psych:  No delerium/psychosis/paranoia HENT: Normocephalic, Mucus membranes moist.  No thrush Neck: Supple, No tracheal deviation Chest: No chest wall pain w good excursion CV:  Pulses intact.  Regular rhythm MS: Normal AROM mjr joints.  No obvious deformity Abdomen: Soft.  Nondistended.  Mildly tender at incisions only.  No evidence of peritonitis.  No incarcerated hernias. Ext:  SCDs BLE.  No mjr edema.  No cyanosis Skin: No petechiae / purpura   Problem List:   Principal Problem:   Obesity, Class III, BMI 40-49.9 (morbid obesity) s/p lap gleeve gastrectomy   Assessment  Robyn  Bryant  38 y.o. female  1 Day Post-Op  Procedure(s): LAPAROSCOPIC GASTRIC SLEEVE RESECTION UPPER ENDOSCOPY UPPER GI ENDOSCOPY  Recovered rather well only postoperative day #1  Plan:  Advanced to postoperative day #1.  Liquid diet.  Advance her protocol.  Mobilize more.  Switch to fentanyl for pain control.  Okay to use Tylenol and Roxicet for oral pain control.  Heat/ice pack.    -VTE prophylaxis- SCDs, etc  -mobilize as tolerated to help recovery  D/C patient from hospital when patient meets criteria (anticipate in 1-2 day(s)):  Tolerating oral intake well Ambulating in walkways Adequate pain control without IV medications Urinating  Having flatus   Adin Hector, M.D., F.A.C.S. Gastrointestinal and Minimally Invasive Surgery [facility] [address], [place], [place] [address] [phone] Main / Paging   08/09/2013   Results:   Labs: Results for orders placed during the hospital encounter of 08/08/13 (from the past 48 hour(s))  PREGNANCY, URINE     Status: None   Collection Time    08/08/13 10:23 AM      Result Value Range   Preg Test, Ur NEGATIVE  NEGATIVE   Comment:            THE SENSITIVITY OF THIS     METHODOLOGY IS >20 mIU/mL.  CBC WITH DIFFERENTIAL     Status: None   Collection Time    08/08/13 10:40 AM      Result Value Range  WBC 7.1  4.0 - 10.5 K/uL   RBC 4.94  3.87 - 5.11 MIL/uL   Hemoglobin 13.6  12.0 - 15.0 g/dL   HCT 40.1  36.0 - 46.0 %   MCV 81.2  78.0 - 100.0 fL   MCH 27.5  26.0 - 34.0 pg   MCHC 33.9  30.0 - 36.0 g/dL   RDW 14.6  11.5 - 15.5 %   Platelets 306  150 - 400 K/uL   Neutrophils Relative % 50  43 - 77 %   Neutro Abs 3.5  1.7 - 7.7 K/uL   Lymphocytes Relative 40  12 - 46 %   Lymphs Abs 2.8  0.7 - 4.0 K/uL   Monocytes Relative 7  3 - 12 %   Monocytes Absolute 0.5  0.1 - 1.0 K/uL   Eosinophils Relative 4  0 - 5 %   Eosinophils Absolute 0.3  0.0 - 0.7 K/uL   Basophils Relative 1  0 -  1 %   Basophils Absolute 0.0  0.0 - 0.1 K/uL  COMPREHENSIVE METABOLIC PANEL     Status: Abnormal   Collection Time    08/08/13 10:40 AM      Result Value Range   Sodium 135 (*) 137 - 147 mEq/L   Comment: Please note change in reference range.   Potassium 4.3  3.7 - 5.3 mEq/L   Comment: Please note change in reference range.   Chloride 98  96 - 112 mEq/L   CO2 25  19 - 32 mEq/L   Glucose, Bld 99  70 - 99 mg/dL   BUN 15  6 - 23 mg/dL   Creatinine, Ser 0.78  0.50 - 1.10 mg/dL   Calcium 9.1  8.4 - 10.5 mg/dL   Total Protein 7.3  6.0 - 8.3 g/dL   Albumin 3.6  3.5 - 5.2 g/dL   AST 32  0 - 37 U/L   ALT 46 (*) 0 - 35 U/L   Alkaline Phosphatase 71  39 - 117 U/L   Total Bilirubin 0.7  0.3 - 1.2 mg/dL   GFR calc non Af Amer >90  >90 mL/min   GFR calc Af Amer >90  >90 mL/min   Comment: (NOTE)     The eGFR has been calculated using the CKD EPI equation.     This calculation has not been validated in all clinical situations.     eGFR's persistently <90 mL/min signify possible Chronic Kidney     Disease.  HEMOGLOBIN AND HEMATOCRIT, BLOOD     Status: None   Collection Time    08/08/13  6:55 PM      Result Value Range   Hemoglobin 12.6  12.0 - 15.0 g/dL   HCT 38.1  36.0 - 46.0 %  CBC WITH DIFFERENTIAL     Status: None   Collection Time    08/09/13  5:55 AM      Result Value Range   WBC 8.6  4.0 - 10.5 K/uL   RBC 4.59  3.87 - 5.11 MIL/uL   Hemoglobin 12.3  12.0 - 15.0 g/dL   HCT 37.6  36.0 - 46.0 %   MCV 81.9  78.0 - 100.0 fL   MCH 26.8  26.0 - 34.0 pg   MCHC 32.7  30.0 - 36.0 g/dL   RDW 14.6  11.5 - 15.5 %   Platelets 314  150 - 400 K/uL   Neutrophils Relative % 74  43 - 77 %   Neutro  Abs 6.3  1.7 - 7.7 K/uL   Lymphocytes Relative 18  12 - 46 %   Lymphs Abs 1.6  0.7 - 4.0 K/uL   Monocytes Relative 8  3 - 12 %   Monocytes Absolute 0.7  0.1 - 1.0 K/uL   Eosinophils Relative 0  0 - 5 %   Eosinophils Absolute 0.0  0.0 - 0.7 K/uL   Basophils Relative 0  0 - 1 %   Basophils Absolute  0.0  0.0 - 0.1 K/uL    Imaging / Studies: Dg Ugi W/water Sol Cm  08/09/2013   CLINICAL DATA:  Status post gastric sleeve surgery. Evaluate for leak.  EXAM: WATER SOLUBLE UPPER GI SERIES  TECHNIQUE: Single-column upper GI series was performed using water soluble contrast.  CONTRAST:  50 mL Omnipaque 350  COMPARISON:  None.  FLUOROSCOPY TIME:  21 seconds  FINDINGS: Scout radiograph of the upper abdomen demonstrates no focal abnormality. Omnipaque 350 was ingested orally in the upright position. Contrast traverses the gastroesophageal junction without delay into the stomach. The contrast empties into the duodenum and proximal small bowel without delay. There is no extravasation of contrast to suggest a gastric leak.  IMPRESSION: No gastric leak status post gastric sleeve surgery.   Electronically Signed   By: Kathreen Devoid   On: 08/09/2013 08:29    Medications / Allergies: per chart  Antibiotics: Anti-infectives   Start     Dose/Rate Route Frequency Ordered Stop   08/08/13 1022  cefOXitin (MEFOXIN) 2 g in dextrose 5 % 50 mL IVPB     2 g 100 mL/hr over 30 Minutes Intravenous On call to O.R. 08/08/13 1022 08/08/13 1249       Note: This dictation was prepared with Dragon/digital dictation along with [employer]. Any transcriptional errors that result from this process are unintentional.

## 2013-08-10 ENCOUNTER — Encounter (HOSPITAL_COMMUNITY): Payer: Self-pay | Admitting: General Surgery

## 2013-08-10 LAB — CBC WITH DIFFERENTIAL/PLATELET
BASOS PCT: 1 % (ref 0–1)
Basophils Absolute: 0 10*3/uL (ref 0.0–0.1)
Eosinophils Absolute: 0.1 10*3/uL (ref 0.0–0.7)
Eosinophils Relative: 1 % (ref 0–5)
HEMATOCRIT: 35.9 % — AB (ref 36.0–46.0)
Hemoglobin: 11.6 g/dL — ABNORMAL LOW (ref 12.0–15.0)
LYMPHS ABS: 3.6 10*3/uL (ref 0.7–4.0)
Lymphocytes Relative: 44 % (ref 12–46)
MCH: 27 pg (ref 26.0–34.0)
MCHC: 32.3 g/dL (ref 30.0–36.0)
MCV: 83.7 fL (ref 78.0–100.0)
MONO ABS: 0.7 10*3/uL (ref 0.1–1.0)
MONOS PCT: 9 % (ref 3–12)
NEUTROS ABS: 3.7 10*3/uL (ref 1.7–7.7)
Neutrophils Relative %: 46 % (ref 43–77)
Platelets: 280 10*3/uL (ref 150–400)
RBC: 4.29 MIL/uL (ref 3.87–5.11)
RDW: 15.2 % (ref 11.5–15.5)
WBC: 8.1 10*3/uL (ref 4.0–10.5)

## 2013-08-10 LAB — BASIC METABOLIC PANEL
BUN: 7 mg/dL (ref 6–23)
CHLORIDE: 102 meq/L (ref 96–112)
CO2: 24 meq/L (ref 19–32)
Calcium: 8.5 mg/dL (ref 8.4–10.5)
Creatinine, Ser: 0.81 mg/dL (ref 0.50–1.10)
GFR calc non Af Amer: >90 mL/min (ref >90)
GLUCOSE: 102 mg/dL — AB (ref 70–99)
Potassium: 4 mEq/L (ref 3.7–5.3)
Sodium: 136 mEq/L — ABNORMAL LOW (ref 137–147)

## 2013-08-10 MED ORDER — OXYCODONE HCL 5 MG/5ML PO SOLN: 5.0000 mg | ORAL | Status: DC | PRN

## 2013-08-10 NOTE — Progress Notes (Signed)
Patient ID: Robyn Bryant, female   DOB: 05/10/1976, 38 y.o.   MRN: [id number] 2 Days Post-Op  Subjective: Having some abdominal gas cramps in her midabdomen and also tenderness around right midabdominal incision. Has had cramps in her legs when trying to walk. Has been ambulatory. Tolerating liquid diet without nausea.  Objective: Vital signs in last 24 hours: Temp:  [97.5 F (36.4 C)-98.5 F (36.9 C)] 98 F (36.7 C) (01/02 0520) Pulse Rate:  [60-79] 65 (01/02 0520) Resp:  [16-18] 18 (01/02 0520) BP: (99-135)/(66-88) 110/71 mmHg (01/02 0520) SpO2:  [95 %-100 %] 98 % (01/02 0520) Last BM Date: 08/10/13  Intake/Output from previous day: 01/01 0701 - 01/02 0700 In: 1942.5 [P.O.:120; I.V.:1822.5] Out: 1450 [Urine:1450] Intake/Output this shift: Total I/O In: 30 [P.O.:30] Out: 150 [Urine:150]  General appearance: alert, cooperative and no distress GI: mild appropriate tenderness around her right midabdominal incision. Incision/Wound: clean and dry without evidence of infection  Lab Results:   Recent Labs  08/08/13 1040  08/09/13 0555 08/09/13 1608  WBC 7.1  --  8.6  --   HGB 13.6  < > 12.3 12.2  HCT 40.1  < > 37.6 37.0  PLT 306  --  314  --   < > = values in this interval not displayed. BMET  Recent Labs  08/08/13 1040  NA 135*  K 4.3  CL 98  CO2 25  GLUCOSE 99  BUN 15  CREATININE 0.78  CALCIUM 9.1     Studies/Results: Dg Ugi W/water Sol Cm  08/09/2013   CLINICAL DATA:  Status post gastric sleeve surgery. Evaluate for leak.  EXAM: WATER SOLUBLE UPPER GI SERIES  TECHNIQUE: Single-column upper GI series was performed using water soluble contrast.  CONTRAST:  50 mL Omnipaque 350  COMPARISON:  None.  FLUOROSCOPY TIME:  21 seconds  FINDINGS: Scout radiograph of the upper abdomen demonstrates no focal abnormality. Omnipaque 350 was ingested orally in the upright position. Contrast traverses the gastroesophageal junction without delay into the stomach. The contrast  empties into the duodenum and proximal small bowel without delay. There is no extravasation of contrast to suggest a gastric leak.  IMPRESSION: No gastric leak status post gastric sleeve surgery.   Electronically Signed   By: Elige KoHetal  Patel   On: 08/09/2013 08:29    Anti-infectives: Anti-infectives   Start     Dose/Rate Route Frequency Ordered Stop   08/08/13 1022  cefOXitin (MEFOXIN) 2 g in dextrose 5 % 50 mL IVPB     2 g 100 mL/hr over 30 Minutes Intravenous On call to O.R. 08/08/13 1022 08/08/13 1249      Assessment/Plan: s/p Procedure(s): LAPAROSCOPIC GASTRIC SLEEVE RESECTION UPPER ENDOSCOPY UPPER GI ENDOSCOPY No apparent complication. Check potassium due to leg cramps. Possible discharge later today.   LOS: 2 days    [name] T 08/10/2013

## 2013-08-10 NOTE — Discharge Summary (Signed)
   Patient ID: Robyn FurbishJessica Bryant [id number] 37 y.o. 03/29/1976  08/08/2013  Discharge date and time: 08/10/2013   Admitting Physician: Glenna FellowsHOXWORTH,Caedence Snowden T  Discharge Physician: Glenna FellowsHOXWORTH,Tika Hannis T  Admission Diagnoses: morbid obesity   Discharge Diagnoses: same  Operations: Procedure(s): LAPAROSCOPIC GASTRIC SLEEVE RESECTION UPPER ENDOSCOPY UPPER GI ENDOSCOPY  Admission Condition: good  Discharged Condition: good  Indication for Admission: patient is a 38 year old female with progressive morbid obesity unresponsive to multiple attempts at medical management. Following an extensive outpatient evaluation and discussion we elected to proceed with laparoscopic sleeve gastrectomy for treatment of her morbid obesity.  Hospital Course: on the morning of admission the patient underwent an uneventful laparoscopic sleeve gastrectomy. Her postoperative course was uncomplicated. On the first postoperative day her CBC is unremarkable and Gastrografin swallow showed no leak or obstruction. She was started on clear liquids. On the second postoperative day she was having some leg cramping and some abdominal cramping. Vital signs remain normal and CBC was unremarkable. We checked electrolytes which were normal. Later in the day she was tolerating her protein shakes and up and ambulating without discomfort. She is felt ready for discharge.   Disposition: Home  Patient Instructions:    Medication List         oxyCODONE 5 MG/5ML solution  Commonly known as:  ROXICODONE  Take 5-10 mLs (5-10 mg total) by mouth every 4 (four) hours as needed.        Activity: activity as tolerated Diet: bariatric protein shakes Wound Care: none needed  Follow-up:  With Dr. Johna SheriffHoxworth in 3 weeks.  Signed: Mariella SaaBenjamin T Aidaly Cordner MD, FACS  08/10/2013, 1:33 PM

## 2013-08-10 NOTE — Progress Notes (Signed)
Patient alert and oriented, pain is controlled. Patient is tolerating fluids, plan to advance to protein shake today.  Reviewed Gastric sleeve discharge instructions with patient and patient is able to articulate understanding.  Provided information on BELT program, Support Group and [facility] outpatient pharmacy. All questions answered, will continue to monitor.  

## 2013-08-10 NOTE — Discharge Instructions (Signed)

## 2013-08-10 NOTE — Progress Notes (Signed)
CSW met with pt prior to d/c. Pt requesting resources for assistance with 38 yr old disabled daughter , at home. CSW recommended contacting DSS for assistance. Pt declined . Pt stated , " I'll manage ." CSW asked if pt had community support available and pt responded , " I'll manage. "  Pt d/c home today.  Werner Lean LCSW [phone]

## 2013-08-10 NOTE — Care Management Note (Signed)
    Page 1 of 1   08/10/2013     12:09:12 PM   CARE MANAGEMENT NOTE 08/10/2013  Patient:  Robyn Bryant,Robyn Bryant   Account Number:  [id number]  Date Initiated:  08/10/2013  Documentation initiated by:  Lorenda IshiharaPEELE,Kimbree Casanas  Subjective/Objective Assessment:   38 yo female admitted s/p sleeve gastrectomy. PTA lived at home with family.     Action/Plan:   Home when stable   Anticipated DC Date:  08/10/2013   Anticipated DC Plan:  HOME W HOME HEALTH SERVICES      DC Planning Services  CM consult      Choice offered to / List presented to:             Status of service:  Completed, signed off Medicare Important Message given?   (If response is "NO", the following Medicare IM given date fields will be blank) Date Medicare IM given:   Date Additional Medicare IM given:    Discharge Disposition:  HOME/SELF CARE  Per UR Regulation:  Reviewed for med. necessity/level of care/duration of stay  If discussed at Long Length of Stay Meetings, dates discussed:    Comments:

## 2013-08-14 ENCOUNTER — Telehealth (INDEPENDENT_AMBULATORY_CARE_PROVIDER_SITE_OTHER): Payer: Self-pay

## 2013-08-14 ENCOUNTER — Ambulatory Visit: Payer: BC Managed Care – PPO

## 2013-08-14 ENCOUNTER — Encounter (INDEPENDENT_AMBULATORY_CARE_PROVIDER_SITE_OTHER): Payer: Self-pay | Admitting: General Surgery

## 2013-08-14 ENCOUNTER — Other Ambulatory Visit (INDEPENDENT_AMBULATORY_CARE_PROVIDER_SITE_OTHER): Payer: Self-pay

## 2013-08-14 DIAGNOSIS — R112 Nausea with vomiting, unspecified: Secondary | ICD-10-CM

## 2013-08-14 DIAGNOSIS — Z9889 Other specified postprocedural states: Principal | ICD-10-CM

## 2013-08-14 MED ORDER — PROMETHAZINE HCL 12.5 MG PO TABS: 12.5000 mg | ORAL_TABLET | ORAL | Status: DC | PRN

## 2013-08-14 NOTE — Telephone Encounter (Signed)
Called and made patient that a prescription for Phenergan has been sent to [facility] on [address].

## 2013-08-15 ENCOUNTER — Encounter (INDEPENDENT_AMBULATORY_CARE_PROVIDER_SITE_OTHER): Payer: Self-pay | Admitting: General Surgery

## 2013-08-16 ENCOUNTER — Ambulatory Visit (INDEPENDENT_AMBULATORY_CARE_PROVIDER_SITE_OTHER): Payer: BC Managed Care – PPO | Admitting: General Surgery

## 2013-08-16 ENCOUNTER — Encounter (INDEPENDENT_AMBULATORY_CARE_PROVIDER_SITE_OTHER): Payer: Self-pay

## 2013-08-16 ENCOUNTER — Telehealth (INDEPENDENT_AMBULATORY_CARE_PROVIDER_SITE_OTHER): Payer: Self-pay

## 2013-08-16 NOTE — Telephone Encounter (Signed)
RTW note faxed to Ms. Downey @ [phone].  Fax confirmation rec'd

## 2013-08-17 ENCOUNTER — Encounter (INDEPENDENT_AMBULATORY_CARE_PROVIDER_SITE_OTHER): Payer: Self-pay | Admitting: General Surgery

## 2013-08-21 ENCOUNTER — Encounter: Payer: BC Managed Care – PPO | Attending: General Surgery

## 2013-08-21 VITALS — Ht 64.0 in | Wt 240.0 lb

## 2013-08-21 DIAGNOSIS — Z713 Dietary counseling and surveillance: Secondary | ICD-10-CM | POA: Insufficient documentation

## 2013-08-21 NOTE — Patient Instructions (Signed)
Patient to follow Phase 3A-Soft, High Protein Diet and follow-up at [facility] in 6 weeks for 2 months post-op nutrition visit for diet advancement. 

## 2013-08-21 NOTE — Progress Notes (Signed)
Bariatric Class:  Appt start time: 1530 end time:  1630.  2 Week Post-Operative Nutrition Class  Patient was seen on 08/21/2013 for Post-Operative Nutrition education at [facility].   Surgery date: 08/08/2013 Surgery type: Sleeve Starting weight at [facility]: 254 lbs on 11/14  Weight today: 240.0 lbs Weight change: 14 lbs Total weight lost: 14 lbs  TANITA  BODY COMP RESULTS  08/21/13   BMI (kg/m^2) 41.2   Fat Mass (lbs) 130.5   Fat Free Mass (lbs) 109.5   Total Body Water (lbs) 80.0   The following the learning objectives were met by the patient during this course:  Identifies Phase 3A (Soft, High Proteins) Dietary Goals and will begin from 2 weeks post-operatively to 2 months post-operatively  Identifies appropriate sources of fluids and proteins   States protein recommendations and appropriate sources post-operatively  Identifies the need for appropriate texture modifications, mastication, and bite sizes when consuming solids  Identifies appropriate multivitamin and calcium sources post-operatively  Describes the need for physical activity post-operatively and will follow MD recommendations  States when to call healthcare provider regarding medication questions or post-operative complications  Handouts given during class include:  Phase 3A: Soft, High Protein Diet Handout  Follow-Up Plan: Patient will follow-up at [facility] in 6 weeks for 8 week post-op nutrition visit for diet advancement per MD.

## 2013-08-24 ENCOUNTER — Encounter (INDEPENDENT_AMBULATORY_CARE_PROVIDER_SITE_OTHER): Payer: Self-pay | Admitting: General Surgery

## 2013-08-24 ENCOUNTER — Ambulatory Visit (INDEPENDENT_AMBULATORY_CARE_PROVIDER_SITE_OTHER): Payer: BC Managed Care – PPO | Admitting: General Surgery

## 2013-08-24 VITALS — BP 128/84 | HR 82 | Temp 97.6°F | Resp 16 | Ht 64.0 in | Wt 239.6 lb

## 2013-08-24 DIAGNOSIS — E669 Obesity, unspecified: Secondary | ICD-10-CM

## 2013-08-24 NOTE — Progress Notes (Signed)
Chief complaint: Followup sleeve gastrectomy tube  History: Patient returns for her first postoperative visit approaching 3 weeks after laparoscopic sleeve gastrectomy for morbid obesity. She is getting along extremely well. She tolerated her liquids well and is advancing to solid food without nausea or vomiting. No pain or fever. She is filling up very quickly. 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 She did notice a little blister on her lower abdomen which has scabbed over.  Exam: BP 128/84  Pulse 82  Temp(Src) 97.6 F (36.4 C) (Temporal)  Resp 16  Ht 5\' 4"  (1.626 m)  Wt 239 lb 9.6 oz (108.682 kg)  BMI 41.11 kg/m2  LMP 07/12/2013 Weight loss of 15 pounds since surgery  General: Appears well Abdomen: Soft and nontender. Surgical incisions all well healed. There is a 5 or 6 cm scab in the lower abdomen without infection. Origin of this is uncertain.  Assessment and plan: Doing very well following sleeve gastrectomy without complication identified. Starting solid diet. Returning to work at full activity. Return in 3 weeks.

## 2013-08-24 NOTE — Patient Instructions (Signed)
No activity restrictions

## 2013-09-02 ENCOUNTER — Emergency Department (HOSPITAL_COMMUNITY): Payer: BC Managed Care – PPO

## 2013-09-02 ENCOUNTER — Encounter (HOSPITAL_COMMUNITY): Payer: Self-pay | Admitting: Emergency Medicine

## 2013-09-02 ENCOUNTER — Inpatient Hospital Stay (HOSPITAL_COMMUNITY)
Admission: EM | Admit: 2013-09-02 | Discharge: 2013-09-10 | DRG: 418 | Disposition: A | Discharge status: Home / Self Care | Payer: BC Managed Care – PPO | Attending: General Surgery | Admitting: General Surgery

## 2013-09-02 DIAGNOSIS — Z6841 Body Mass Index (BMI) 40.0 and over, adult: Secondary | ICD-10-CM

## 2013-09-02 DIAGNOSIS — Z825 Family history of asthma and other chronic lower respiratory diseases: Secondary | ICD-10-CM

## 2013-09-02 DIAGNOSIS — K819 Cholecystitis, unspecified: Principal | ICD-10-CM | POA: Diagnosis present

## 2013-09-02 DIAGNOSIS — Z9884 Bariatric surgery status: Secondary | ICD-10-CM

## 2013-09-02 DIAGNOSIS — Z833 Family history of diabetes mellitus: Secondary | ICD-10-CM

## 2013-09-02 DIAGNOSIS — Z8249 Family history of ischemic heart disease and other diseases of the circulatory system: Secondary | ICD-10-CM

## 2013-09-02 DIAGNOSIS — R109 Unspecified abdominal pain: Secondary | ICD-10-CM

## 2013-09-02 DIAGNOSIS — G8918 Other acute postprocedural pain: Secondary | ICD-10-CM

## 2013-09-02 LAB — URINALYSIS, ROUTINE W REFLEX MICROSCOPIC
GLUCOSE, UA: NEGATIVE mg/dL
Hgb urine dipstick: NEGATIVE
Ketones, ur: >80 mg/dL — AB
LEUKOCYTES UA: NEGATIVE
NITRITE: NEGATIVE
PH: 6 (ref 5.0–8.0)
PROTEIN: 30 mg/dL — AB
Specific Gravity, Urine: 1.025 (ref 1.005–1.030)
Urobilinogen, UA: 1 mg/dL (ref 0.0–1.0)

## 2013-09-02 LAB — COMPREHENSIVE METABOLIC PANEL
ALBUMIN: 3.5 g/dL (ref 3.5–5.2)
ALT: 77 U/L — ABNORMAL HIGH (ref 0–35)
AST: 43 U/L — ABNORMAL HIGH (ref 0–37)
Alkaline Phosphatase: 74 U/L (ref 39–117)
BILIRUBIN TOTAL: 0.7 mg/dL (ref 0.3–1.2)
BUN: 9 mg/dL (ref 6–23)
CO2: 23 mEq/L (ref 19–32)
CREATININE: 0.67 mg/dL (ref 0.50–1.10)
Calcium: 9 mg/dL (ref 8.4–10.5)
Chloride: 101 mEq/L (ref 96–112)
GFR calc Af Amer: >90 mL/min (ref >90)
Glucose, Bld: 84 mg/dL (ref 70–99)
Potassium: 3.3 mEq/L — ABNORMAL LOW (ref 3.7–5.3)
Sodium: 142 mEq/L (ref 137–147)
Total Protein: 7.1 g/dL (ref 6.0–8.3)

## 2013-09-02 LAB — LIPASE, BLOOD: LIPASE: 102 U/L — AB (ref 11–59)

## 2013-09-02 LAB — URINE MICROSCOPIC-ADD ON

## 2013-09-02 LAB — CBC WITH DIFFERENTIAL/PLATELET
Basophils Absolute: 0 10*3/uL (ref 0.0–0.1)
Basophils Relative: 0 % (ref 0–1)
Eosinophils Absolute: 0.2 10*3/uL (ref 0.0–0.7)
Eosinophils Relative: 4 % (ref 0–5)
HEMATOCRIT: 39.7 % (ref 36.0–46.0)
HEMOGLOBIN: 13.4 g/dL (ref 12.0–15.0)
Lymphocytes Relative: 34 % (ref 12–46)
Lymphs Abs: 2.1 10*3/uL (ref 0.7–4.0)
MCH: 27.5 pg (ref 26.0–34.0)
MCHC: 33.8 g/dL (ref 30.0–36.0)
MCV: 81.5 fL (ref 78.0–100.0)
MONO ABS: 0.8 10*3/uL (ref 0.1–1.0)
MONOS PCT: 13 % — AB (ref 3–12)
Neutro Abs: 3 10*3/uL (ref 1.7–7.7)
Neutrophils Relative %: 50 % (ref 43–77)
Platelets: 257 10*3/uL (ref 150–400)
RBC: 4.87 MIL/uL (ref 3.87–5.11)
RDW: 15 % (ref 11.5–15.5)
WBC: 6.1 10*3/uL (ref 4.0–10.5)

## 2013-09-02 LAB — POCT PREGNANCY, URINE: PREG TEST UR: NEGATIVE

## 2013-09-02 MED ORDER — ENOXAPARIN SODIUM 40 MG/0.4ML ~~LOC~~ SOLN
40.0000 mg | SUBCUTANEOUS | Status: DC
Start: 2013-09-02 — End: 2013-09-10
  Administered 2013-09-02 – 2013-09-09 (×7): 40 mg via SUBCUTANEOUS
  Filled 2013-09-02 (×10): qty 0.4

## 2013-09-02 MED ORDER — SODIUM CHLORIDE 0.9 % IV BOLUS (SEPSIS)
1000.0000 mL | Freq: Once | INTRAVENOUS | Status: AC
  Administered 2013-09-02: 1000 mL via INTRAVENOUS

## 2013-09-02 MED ORDER — DIPHENHYDRAMINE HCL 50 MG/ML IJ SOLN
25.0000 mg | Freq: Once | INTRAMUSCULAR | Status: AC
  Administered 2013-09-02: 25 mg via INTRAVENOUS
  Filled 2013-09-02: qty 1

## 2013-09-02 MED ORDER — IOHEXOL 300 MG/ML  SOLN
25.0000 mL | Freq: Once | INTRAMUSCULAR | Status: AC | PRN
  Administered 2013-09-02: 25 mL via ORAL

## 2013-09-02 MED ORDER — MORPHINE SULFATE 4 MG/ML IJ SOLN: 4.0000 mg | Freq: Once | INTRAMUSCULAR | Status: DC

## 2013-09-02 MED ORDER — DOCUSATE SODIUM 100 MG PO CAPS
100.0000 mg | ORAL_CAPSULE | Freq: Two times a day (BID) | ORAL | Status: DC
  Administered 2013-09-02 – 2013-09-09 (×11): 100 mg via ORAL
  Filled 2013-09-02 (×15): qty 1

## 2013-09-02 MED ORDER — ONDANSETRON HCL 4 MG/2ML IJ SOLN
4.0000 mg | Freq: Once | INTRAMUSCULAR | Status: AC
  Administered 2013-09-02: 4 mg via INTRAVENOUS
  Filled 2013-09-02: qty 2

## 2013-09-02 MED ORDER — HYDROMORPHONE HCL PF 1 MG/ML IJ SOLN
1.0000 mg | Freq: Once | INTRAMUSCULAR | Status: AC
  Administered 2013-09-02: 1 mg via INTRAVENOUS
  Filled 2013-09-02: qty 1

## 2013-09-02 MED ORDER — SODIUM CHLORIDE 0.9 % IV SOLN
INTRAVENOUS | Status: DC
  Administered 2013-09-02 – 2013-09-04 (×4): via INTRAVENOUS

## 2013-09-02 MED ORDER — PANTOPRAZOLE SODIUM 40 MG IV SOLR
40.0000 mg | Freq: Two times a day (BID) | INTRAVENOUS | Status: DC
  Administered 2013-09-02 – 2013-09-09 (×14): 40 mg via INTRAVENOUS
  Filled 2013-09-02 (×19): qty 40

## 2013-09-02 MED ORDER — ONDANSETRON HCL 4 MG/2ML IJ SOLN
4.0000 mg | Freq: Four times a day (QID) | INTRAMUSCULAR | Status: DC | PRN
  Administered 2013-09-03 – 2013-09-08 (×9): 4 mg via INTRAVENOUS
  Filled 2013-09-02 (×9): qty 2

## 2013-09-02 MED ORDER — IOHEXOL 300 MG/ML  SOLN
100.0000 mL | Freq: Once | INTRAMUSCULAR | Status: AC | PRN
  Administered 2013-09-02: 100 mL via INTRAVENOUS

## 2013-09-02 MED ORDER — MORPHINE SULFATE 2 MG/ML IJ SOLN
2.0000 mg | INTRAMUSCULAR | Status: DC | PRN
  Administered 2013-09-03 (×2): 2 mg via INTRAVENOUS
  Filled 2013-09-02 (×2): qty 1

## 2013-09-02 MED ORDER — MORPHINE SULFATE 2 MG/ML IJ SOLN: 2.0000 mg | INTRAMUSCULAR | Status: DC | PRN

## 2013-09-02 MED ORDER — OXYCODONE HCL 5 MG PO TABS
5.0000 mg | ORAL_TABLET | ORAL | Status: DC | PRN
  Administered 2013-09-02 – 2013-09-08 (×10): 5 mg via ORAL
  Filled 2013-09-02 (×12): qty 1

## 2013-09-02 MED ORDER — LIP MEDEX EX OINT
TOPICAL_OINTMENT | CUTANEOUS | Status: AC
  Administered 2013-09-02: 23:00:00
  Filled 2013-09-02: qty 7

## 2013-09-02 MED ORDER — DIPHENHYDRAMINE HCL 25 MG PO CAPS
25.0000 mg | ORAL_CAPSULE | ORAL | Status: DC | PRN
  Administered 2013-09-02 – 2013-09-08 (×8): 25 mg via ORAL
  Filled 2013-09-02 (×8): qty 1

## 2013-09-02 NOTE — H&P (Signed)
Robyn Bryant is an 38 y.o. female.   Chief Complaint: abd pain HPI: This is a 38 year old female who presents to the hospital approximately 3-1/2 weeks after safe gastric resection for morbid obesity. She states that over the past 3-4 days she has had increasing mid epigastric and right upper quadrant pain. She states that this is a constant pain and intermittently turns into a sharp shooting pain.  It does not get worse when she eats, but she states the pain causes her to stop eating.  She denies fevers.  The pain is associated with nausea but no vomiting or changes in bowel habits.  She does not feel that she is able to keep enough liquids in to stay hydrated.   Past Medical History  Diagnosis Date  . Asthma     as teenager, none now    Past Surgical History  Procedure Laterality Date  . Tubal ligation  2001  . Laparoscopic gastric sleeve resection N/A 08/08/2013    Procedure: LAPAROSCOPIC GASTRIC SLEEVE RESECTION UPPER ENDOSCOPY;  Surgeon: Edward Jolly, MD;  Location: [facility];  Service: General;  Laterality: N/A;  . Upper gi endoscopy  08/08/2013    Procedure: UPPER GI ENDOSCOPY;  Surgeon: Edward Jolly, MD;  Location: [facility];  Service: General;;    Family History  Problem Relation Age of Onset  . Diabetes Mother   . Hypertension Mother   . Alcohol abuse Father   . Asthma Sister    Social History:  reports that she has never smoked. She has never used smokeless tobacco. She reports that she does not drink alcohol or use illicit drugs.  Allergies:  Allergies  Allergen Reactions  . Morphine And Related     Stomach cramps  . Latex Hives  . Dilaudid [Hydromorphone] Itching     (Not in a hospital admission)  Results for orders placed during the hospital encounter of 09/02/13 (from the past 48 hour(s))  CBC WITH DIFFERENTIAL     Status: Abnormal   Collection Time    09/02/13  4:25 PM      Result Value Range   WBC 6.1  4.0 - 10.5 K/uL   RBC 4.87  3.87 - 5.11  MIL/uL   Hemoglobin 13.4  12.0 - 15.0 g/dL   HCT 39.7  36.0 - 46.0 %   MCV 81.5  78.0 - 100.0 fL   MCH 27.5  26.0 - 34.0 pg   MCHC 33.8  30.0 - 36.0 g/dL   RDW 15.0  11.5 - 15.5 %   Platelets 257  150 - 400 K/uL   Neutrophils Relative % 50  43 - 77 %   Neutro Abs 3.0  1.7 - 7.7 K/uL   Lymphocytes Relative 34  12 - 46 %   Lymphs Abs 2.1  0.7 - 4.0 K/uL   Monocytes Relative 13 (*) 3 - 12 %   Monocytes Absolute 0.8  0.1 - 1.0 K/uL   Eosinophils Relative 4  0 - 5 %   Eosinophils Absolute 0.2  0.0 - 0.7 K/uL   Basophils Relative 0  0 - 1 %   Basophils Absolute 0.0  0.0 - 0.1 K/uL  COMPREHENSIVE METABOLIC PANEL     Status: Abnormal   Collection Time    09/02/13  4:25 PM      Result Value Range   Sodium 142  137 - 147 mEq/L   Potassium 3.3 (*) 3.7 - 5.3 mEq/L   Chloride 101  96 - 112  mEq/L   CO2 23  19 - 32 mEq/L   Glucose, Bld 84  70 - 99 mg/dL   BUN 9  6 - 23 mg/dL   Creatinine, Ser 0.67  0.50 - 1.10 mg/dL   Calcium 9.0  8.4 - 10.5 mg/dL   Total Protein 7.1  6.0 - 8.3 g/dL   Albumin 3.5  3.5 - 5.2 g/dL   AST 43 (*) 0 - 37 U/L   ALT 77 (*) 0 - 35 U/L   Alkaline Phosphatase 74  39 - 117 U/L   Total Bilirubin 0.7  0.3 - 1.2 mg/dL   GFR calc non Af Amer >90  >90 mL/min   GFR calc Af Amer >90  >90 mL/min   Comment: (NOTE)     The eGFR has been calculated using the CKD EPI equation.     This calculation has not been validated in all clinical situations.     eGFR's persistently <90 mL/min signify possible Chronic Kidney     Disease.  LIPASE, BLOOD     Status: Abnormal   Collection Time    09/02/13  4:25 PM      Result Value Range   Lipase 102 (*) 11 - 59 U/L  URINALYSIS, ROUTINE W REFLEX MICROSCOPIC     Status: Abnormal   Collection Time    09/02/13  6:45 PM      Result Value Range   Color, Urine AMBER (*) YELLOW   Comment: BIOCHEMICALS MAY BE AFFECTED BY COLOR   APPearance CLOUDY (*) CLEAR   Specific Gravity, Urine 1.025  1.005 - 1.030   pH 6.0  5.0 - 8.0   Glucose, UA  NEGATIVE  NEGATIVE mg/dL   Hgb urine dipstick NEGATIVE  NEGATIVE   Bilirubin Urine MODERATE (*) NEGATIVE   Ketones, ur >80 (*) NEGATIVE mg/dL   Protein, ur 30 (*) NEGATIVE mg/dL   Urobilinogen, UA 1.0  0.0 - 1.0 mg/dL   Nitrite NEGATIVE  NEGATIVE   Leukocytes, UA NEGATIVE  NEGATIVE  URINE MICROSCOPIC-ADD ON     Status: Abnormal   Collection Time    09/02/13  6:45 PM      Result Value Range   Squamous Epithelial / LPF RARE  RARE   WBC, UA 0-2  <3 WBC/hpf   RBC / HPF 0-2  <3 RBC/hpf   Crystals CA OXALATE CRYSTALS (*) NEGATIVE   Urine-Other MUCOUS PRESENT     Comment: AMORPHOUS URATES/PHOSPHATES  POCT PREGNANCY, URINE     Status: None   Collection Time    09/02/13  7:01 PM      Result Value Range   Preg Test, Ur NEGATIVE  NEGATIVE   Comment:            THE SENSITIVITY OF THIS     METHODOLOGY IS >24 mIU/mL   Ct Abdomen Pelvis W Contrast  09/02/2013   CLINICAL DATA:  Pain.  EXAM: CT ABDOMEN AND PELVIS WITH CONTRAST  TECHNIQUE: Multidetector CT imaging of the abdomen and pelvis was performed using the standard protocol following bolus administration of intravenous contrast.  CONTRAST:  22m OMNIPAQUE IOHEXOL 300 MG/ML SOLN, 103mOMNIPAQUE IOHEXOL 300 MG/ML SOLN  COMPARISON:  Abdominal series 125 2015.  Ultrasound 06/06/2013.  FINDINGS: Liver normal. Spleen is normal. Pancreas normal. Gallbladder is nondistended. No biliary distention. Portal vein and splenic vein are patent.  Adrenals normal. Kidneys are normal. No evidence of hydronephrosis or obstructing ureteral stone. Pelvic phleboliths. The bladder is nondistended. Mild bladder wall thickening  is noted. This may be from lack of bladder distention however process such as cystitis cannot be excluded. Tiny amount of air may be present in the bladder. This could be seen with bladder infection or from instrumentation. Uterus unremarkable. Tubal ligation clips noted on the left. No tubal ligation clips on the right. 3.6 x 3.1 cm right adnexal  cystic mass is noted. This cyst most likely an ovarian cyst. To exclude a ectopic pregnancy, pregnancy testis suggested. Pelvic ultrasound may also prove useful 4 further evaluation. Small amount of free pelvic fluid is present.  No adenopathy. Aorta widely patent and of normal caliber. Visceral vessels are patent.  Appendix is difficult to visualize. What appears to be the appendix is normal. The colon is nondistended. Stool present in the colon. No small bowel distention. No gastric distention. Patient has had prior gastric surgery. No significant hernia.  Lung bases are clear. Heart size normal. No acute bony abnormality. Bilateral sacroiliac degenerative change present. Bilateral hip DJD. Mild degenerative changes lumbar spine.  IMPRESSION: 1. Mild bladder wall thickening. This may be from lack of distension. Small amount of air may be be present in the bladder. This may be from instrumentation. These findings can also be seen with bladder infection. 2. 3.6 x 3.1 cm right adnexal cystic mass. This is most likely a simple ovarian cyst. To exclude ectopic pregnancy, pregnancy test is suggested. Small amount of free pelvic fluid. Pelvic ultrasound can also be obtained. 3. Patient appears to have had a tubal ligation. No tubal ligation clip noted on the right. 4. Prior gastric surgery.  No bowel distention.   Electronically Signed   By: Marcello Moores  Register   On: 09/02/2013 19:30   Dg Abd Acute W/chest  09/02/2013   CLINICAL DATA:  Status post gastric sleeve 08/08/2013, diffuse abdominal pain  EXAM: ACUTE ABDOMEN SERIES (ABDOMEN 2 VIEW & CHEST 1 VIEW)  COMPARISON:  08/09/2013, 06/06/2013  FINDINGS: Low lung volumes noted. This accentuates the heart size and vascularity. Negative for CHF or pneumonia. No effusion or pneumothorax. Negative for free air.  Scattered air and stool throughout the bowel. No significant dilatation or obstruction. Postop changes from gastric sleeve procedure.  IMPRESSION: No acute finding in  the chest or abdomen by plain radiography   Electronically Signed   By: Daryll Brod M.D.   On: 09/02/2013 17:42    Review of Systems  Constitutional: Positive for malaise/fatigue. Negative for fever and chills.  Eyes: Negative for blurred vision.  Respiratory: Negative for cough, sputum production and shortness of breath.   Cardiovascular: Negative for chest pain, palpitations and leg swelling.  Gastrointestinal: Positive for heartburn, nausea and abdominal pain. Negative for vomiting, diarrhea, constipation, blood in stool and melena.  Genitourinary: Negative for dysuria, urgency and frequency.  Musculoskeletal: Negative for myalgias.  Neurological: Negative for dizziness and headaches.    Blood pressure 104/72, pulse 77, temperature 97.8 F (36.6 C), temperature source Oral, resp. rate 18, last menstrual period 08/05/2013, SpO2 94.00%. Physical Exam  Constitutional: She is oriented to person, place, and time. She appears well-developed and well-nourished.  HENT:  Head: Normocephalic and atraumatic.  Eyes: Conjunctivae are normal. Pupils are equal, round, and reactive to light.  Neck: Normal range of motion.  Cardiovascular: Normal rate and regular rhythm.   Respiratory: Effort normal.  GI: Soft. She exhibits no distension. There is tenderness. There is no rebound and no guarding.  RUQ and mid-epigastric tenderness  Musculoskeletal: Normal range of motion.  Neurological: She is alert  and oriented to person, place, and time.  Skin: Skin is warm and dry.     Assessment/Plan Ashaya Raftery He is a 38 year old female status post sleeve gastric resection for morbid obesity. She presents to worsening abdominal pain and inability to take in liquids efficiently. Workup is positive for a mildly elevated lipase. CT scan shows no complications from surgery. I have recommended that she be placed overnight observation with IV fluids. I will place her on a PPI and order a right upper quadrant  ultrasound in the morning.   Ami Mally C. 9/79/8921, 1:94 PM

## 2013-09-02 NOTE — ED Notes (Signed)
Pt presents with NAD- Pt has gastric sleeve performed 08/08/2013. Pt reports stomach pain , nausea and poor appetite since, denies vomiting, diarrhea  and fever.

## 2013-09-02 NOTE — ED Notes (Signed)
Patient transported to CT 

## 2013-09-02 NOTE — Progress Notes (Addendum)
Pt reports "discovered she was allergic to Morphine and Dilaudid" when she had her surgery on 08/08/13. Called MD on call for orders for pain control and prn Benadryl

## 2013-09-02 NOTE — ED Provider Notes (Signed)
CSN: [id number]     Arrival date & time 09/02/13  1522 History   First MD Initiated Contact with Patient 09/02/13 1559     Chief Complaint  Patient presents with  . Abdominal Pain  . Weakness  . Nausea   (Consider location/radiation/quality/duration/timing/severity/associated sxs/prior Treatment) HPI Comments: Patient is a 38 year old female past medical history significant for asthma, recent laparoscopic gastric sleeve resection on 08/08/2013 performed by Dr. Johna SheriffHoxworth presented to the emergency department for 4 days of generalized waxing and waning abdominal pain with associated nausea. Patient denies any alleviating or aggravating factors. Patient states she has not tried to take anything for her pain. Patient has a followup appointment with Dr. Johna SheriffHoxworth on 08/24/13 which went well. Patient was moved to phase 3 of her diet. Patient states up until 4 days ago she had not been having any difficulties postop. Patient does endorse decreased by mouth intake. She denies any fevers, emesis, diarrhea, urinary symptoms, vaginal complaints, constipation. Patient states she has been able to pass gas. Patient's only other abdominal surgical history includes a C-section.   Past Medical History  Diagnosis Date  . Asthma     as teenager, none now   Past Surgical History  Procedure Laterality Date  . Tubal ligation  2001  . Laparoscopic gastric sleeve resection N/A 08/08/2013    Procedure: LAPAROSCOPIC GASTRIC SLEEVE RESECTION UPPER ENDOSCOPY;  Surgeon: Mariella SaaBenjamin T Hoxworth, MD;  Location: [facility];  Service: General;  Laterality: N/A;  . Upper gi endoscopy  08/08/2013    Procedure: UPPER GI ENDOSCOPY;  Surgeon: Mariella SaaBenjamin T Hoxworth, MD;  Location: [facility];  Service: General;;   Family History  Problem Relation Age of Onset  . Diabetes Mother   . Hypertension Mother   . Alcohol abuse Father   . Asthma Sister    History  Substance Use Topics  . Smoking status: Never Smoker   . Smokeless tobacco: Never  Used  . Alcohol Use: No   OB History   Grav Para Term Preterm Abortions TAB SAB Ect Mult Living                 Review of Systems  Gastrointestinal: Positive for nausea and abdominal pain.  All other systems reviewed and are negative.    Allergies  Morphine and related; Latex; and Dilaudid  Home Medications  No current outpatient prescriptions on file. BP 107/70  Pulse 65  Temp(Src) 97.9 F (36.6 C) (Oral)  Resp 16  Ht 5\' 4"  (1.626 m)  SpO2 99%  LMP 08/05/2013 Physical Exam  Constitutional: She is oriented to person, place, and time. She appears well-developed and well-nourished.  HENT:  Head: Normocephalic and atraumatic.  Right Ear: External ear normal.  Left Ear: External ear normal.  Nose: Nose normal.  Eyes: Conjunctivae are normal.  Neck: Neck supple.  Cardiovascular: Normal rate, regular rhythm and normal heart sounds.   Pulmonary/Chest: Effort normal and breath sounds normal. No respiratory distress.  Abdominal: Soft. Bowel sounds are normal. She exhibits no distension. There is tenderness. There is no rebound and no guarding.  3 well-healed surgical laparoscopic incision sites appreciated.  Musculoskeletal: Normal range of motion. She exhibits no edema.  Neurological: She is alert and oriented to person, place, and time.  Skin: Skin is warm and dry.    ED Course  Procedures (including critical care time) Medications  enoxaparin (LOVENOX) injection 40 mg (40 mg Subcutaneous Given 09/02/13 2241)  0.9 %  sodium chloride infusion ( Intravenous New Bag/Given 09/02/13  2240)  oxyCODONE (Oxy IR/ROXICODONE) immediate release tablet 5 mg (5 mg Oral Given 09/02/13 2258)  docusate sodium (COLACE) capsule 100 mg (100 mg Oral Given 09/02/13 2240)  ondansetron (ZOFRAN) injection 4 mg (not administered)  pantoprazole (PROTONIX) injection 40 mg (40 mg Intravenous Given 09/02/13 2241)  morphine 2 MG/ML injection 2 mg (not administered)  diphenhydrAMINE (BENADRYL) capsule 25  mg (25 mg Oral Given 09/02/13 2258)  sodium chloride 0.9 % bolus 1,000 mL (0 mLs Intravenous Stopped 09/02/13 1844)  diphenhydrAMINE (BENADRYL) injection 25 mg (25 mg Intravenous Given 09/02/13 1708)  ondansetron (ZOFRAN) injection 4 mg (4 mg Intravenous Given 09/02/13 1707)  HYDROmorphone (DILAUDID) injection 1 mg (1 mg Intravenous Given 09/02/13 1707)  iohexol (OMNIPAQUE) 300 MG/ML solution 25 mL (25 mLs Oral Contrast Given 09/02/13 1814)  iohexol (OMNIPAQUE) 300 MG/ML solution 100 mL (100 mLs Intravenous Contrast Given 09/02/13 1859)  HYDROmorphone (DILAUDID) injection 1 mg (1 mg Intravenous Given 09/02/13 1944)  lip balm (CARMEX) ointment (  Given 09/02/13 2241)    Labs Review Labs Reviewed  CBC WITH DIFFERENTIAL - Abnormal; Notable for the following:    Monocytes Relative 13 (*)    All other components within normal limits  COMPREHENSIVE METABOLIC PANEL - Abnormal; Notable for the following:    Potassium 3.3 (*)    AST 43 (*)    ALT 77 (*)    All other components within normal limits  LIPASE, BLOOD - Abnormal; Notable for the following:    Lipase 102 (*)    All other components within normal limits  URINALYSIS, ROUTINE W REFLEX MICROSCOPIC - Abnormal; Notable for the following:    Color, Urine AMBER (*)    APPearance CLOUDY (*)    Bilirubin Urine MODERATE (*)    Ketones, ur >80 (*)    Protein, ur 30 (*)    All other components within normal limits  URINE MICROSCOPIC-ADD ON - Abnormal; Notable for the following:    Crystals CA OXALATE CRYSTALS (*)    All other components within normal limits  BASIC METABOLIC PANEL  CBC  POCT PREGNANCY, URINE   Imaging Review Ct Abdomen Pelvis W Contrast  09/02/2013   CLINICAL DATA:  Pain.  EXAM: CT ABDOMEN AND PELVIS WITH CONTRAST  TECHNIQUE: Multidetector CT imaging of the abdomen and pelvis was performed using the standard protocol following bolus administration of intravenous contrast.  CONTRAST:  25mL OMNIPAQUE IOHEXOL 300 MG/ML SOLN,  OMNIPAQUE IOHEXOL 300 MG/ML SOLN  COMPARISON:  Abdominal series 125 2015.  Ultrasound 06/06/2013.  FINDINGS: Liver normal. Spleen is normal. Pancreas normal. Gallbladder is nondistended. No biliary distention. Portal vein and splenic vein are patent.  Adrenals normal. Kidneys are normal. No evidence of hydronephrosis or obstructing ureteral stone. Pelvic phleboliths. The bladder is nondistended. Mild bladder wall thickening is noted. This may be from lack of bladder distention however process such as cystitis cannot be excluded. Tiny amount of air may be present in the bladder. This could be seen with bladder infection or from instrumentation. Uterus unremarkable. Tubal ligation clips noted on the left. No tubal ligation clips on the right. 3.6 x 3.1 cm right adnexal cystic mass is noted. This cyst most likely an ovarian cyst. To exclude a ectopic pregnancy, pregnancy testis suggested. Pelvic ultrasound may also prove useful 4 further evaluation. Small amount of free pelvic fluid is present.  No adenopathy. Aorta widely patent and of normal caliber. Visceral vessels are patent.  Appendix is difficult to visualize. What appears to be the appendix  is normal. The colon is nondistended. Stool present in the colon. No small bowel distention. No gastric distention. Patient has had prior gastric surgery. No significant hernia.  Lung bases are clear. Heart size normal. No acute bony abnormality. Bilateral sacroiliac degenerative change present. Bilateral hip DJD. Mild degenerative changes lumbar spine.  IMPRESSION: 1. Mild bladder wall thickening. This may be from lack of distension. Small amount of air may be be present in the bladder. This may be from instrumentation. These findings can also be seen with bladder infection. 2. 3.6 x 3.1 cm right adnexal cystic mass. This is most likely a simple ovarian cyst. To exclude ectopic pregnancy, pregnancy test is suggested. Small amount of free pelvic fluid. Pelvic ultrasound can  also be obtained. 3. Patient appears to have had a tubal ligation. No tubal ligation clip noted on the right. 4. Prior gastric surgery.  No bowel distention.   Electronically Signed   By: Maisie Fus  Register   On: 09/02/2013 19:30   Dg Abd Acute W/chest  09/02/2013   CLINICAL DATA:  Status post gastric sleeve 08/08/2013, diffuse abdominal pain  EXAM: ACUTE ABDOMEN SERIES (ABDOMEN 2 VIEW & CHEST 1 VIEW)  COMPARISON:  08/09/2013, 06/06/2013  FINDINGS: Low lung volumes noted. This accentuates the heart size and vascularity. Negative for CHF or pneumonia. No effusion or pneumothorax. Negative for free air.  Scattered air and stool throughout the bowel. No significant dilatation or obstruction. Postop changes from gastric sleeve procedure.  IMPRESSION: No acute finding in the chest or abdomen by plain radiography   Electronically Signed   By: Ruel Favors M.D.   On: 09/02/2013 17:42    EKG Interpretation    Date/Time:    Ventricular Rate:    PR Interval:    QRS Duration:   QT Interval:    QTC Calculation:   R Axis:     Text Interpretation:              MDM   1. Post-operative pain   2. Abdominal pain    Filed Vitals:   09/02/13 2120  BP: 107/70  Pulse: 65  Temp: 97.9 F (36.6 C)  Resp: 16     6:02 PM Dr. Maisie Fus consulted, recommends CT abdomen/pelvis with contrast will follow up with once results are back.   8:36 PM Dr. Maisie Fus will admit patient for overnight observation.   Afebrile, NAD, non-toxic appearing, AAOx4. Abdomen soft, diffusely tender, no distention. Patient 3 and half weeks postop from gastric sleeve placement. I have reviewed nursing notes, vital signs, and all appropriate lab and imaging results for this patient. General surgery was consulted, they have evaluated CT scan findings and will admit the patient for overnight observation for further management and evaluation of postoperative pain and laboratory findings. Patient d/w with Dr. Silverio Lay, agrees with plan.       Jeannetta Ellis, PA-C 09/03/13 [phone]

## 2013-09-03 ENCOUNTER — Encounter (HOSPITAL_COMMUNITY): Payer: Self-pay | Admitting: *Deleted

## 2013-09-03 ENCOUNTER — Observation Stay (HOSPITAL_COMMUNITY): Payer: BC Managed Care – PPO

## 2013-09-03 LAB — CBC
HCT: 35.4 % — ABNORMAL LOW (ref 36.0–46.0)
HEMOGLOBIN: 11.5 g/dL — AB (ref 12.0–15.0)
MCH: 26.9 pg (ref 26.0–34.0)
MCHC: 32.5 g/dL (ref 30.0–36.0)
MCV: 82.7 fL (ref 78.0–100.0)
PLATELETS: 214 10*3/uL (ref 150–400)
RBC: 4.28 MIL/uL (ref 3.87–5.11)
RDW: 15.3 % (ref 11.5–15.5)
WBC: 5.2 10*3/uL (ref 4.0–10.5)

## 2013-09-03 LAB — LIPASE, BLOOD: LIPASE: 53 U/L (ref 11–59)

## 2013-09-03 LAB — BASIC METABOLIC PANEL
BUN: 7 mg/dL (ref 6–23)
CO2: 24 mEq/L (ref 19–32)
Calcium: 8.4 mg/dL (ref 8.4–10.5)
Chloride: 104 mEq/L (ref 96–112)
Creatinine, Ser: 0.76 mg/dL (ref 0.50–1.10)
GFR calc Af Amer: >90 mL/min (ref >90)
Glucose, Bld: 69 mg/dL — ABNORMAL LOW (ref 70–99)
POTASSIUM: 3.5 meq/L — AB (ref 3.7–5.3)
SODIUM: 142 meq/L (ref 137–147)

## 2013-09-03 MED ORDER — ALUM & MAG HYDROXIDE-SIMETH 200-200-20 MG/5ML PO SUSP
30.0000 mL | ORAL | Status: DC | PRN
  Administered 2013-09-03 – 2013-09-07 (×4): 30 mL via ORAL
  Filled 2013-09-03 (×4): qty 30

## 2013-09-03 MED ORDER — HYDROMORPHONE HCL PF 1 MG/ML IJ SOLN
1.0000 mg | INTRAMUSCULAR | Status: DC | PRN
  Administered 2013-09-03 – 2013-09-07 (×13): 1 mg via INTRAVENOUS
  Filled 2013-09-03 (×13): qty 1

## 2013-09-03 NOTE — Progress Notes (Signed)
Patient c/o morphine IV and oral pain med is not relieving pain. Relayed Dilaudid works better but makes her itch but can tolerate if given with benadryl. Dr. Johna SheriffHoxworth notified via telephone and will put in order for Dilauduid. This nurse spoke with [facility]. Patient needs to be npo after midnight and no pain med received within 6 hours. Dr. Johna SheriffHoxworth made aware.

## 2013-09-03 NOTE — Progress Notes (Signed)
Patient ID: Robyn Bryant, female   DOB: [birth year], 38 y.o.   MRN: [id number]    Subjective: Still with right upper quadrant epigastric and low substernal pain which is sharp. Better only with medications but then returns with the same intensity. Still with some nausea but no vomiting.  Objective: Vital signs in last 24 hours: Temp:  [97.8 F (36.6 C)-99.1 F (37.3 C)] 97.9 F (36.6 C) (01/26 0545) Pulse Rate:  [57-84] 57 (01/26 0545) Resp:  [16-18] 16 (01/26 0545) BP: (104-109)/(56-81) 109/64 mmHg (01/26 0545) SpO2:  [94 %-100 %] 99 % (01/26 0545) Weight:  [235 lb (106.595 kg)] 235 lb (106.595 kg) (01/26 0700) Last BM Date: 09/01/13  Intake/Output from previous day: 01/25 0701 - 01/26 0700 In: 733.3 [I.V.:733.3] Out: 0  Intake/Output this shift: Total I/O In: 510 [P.O.:60; I.V.:450] Out: -   Exam: General: Alert and cooperative in no apparent distress GI: moderate to severe epigastric and right upper quadrant tenderness with some guarding.  Lab Results:   Recent Labs  09/02/13 1625 09/03/13 0504  WBC 6.1 5.2  HGB 13.4 11.5*  HCT 39.7 35.4*  PLT 257 214   BMET  Recent Labs  09/02/13 1625 09/03/13 0504  NA 142 142  K 3.3* 3.5*  CL 101 104  CO2 23 24  GLUCOSE 84 69*  BUN 9 7  CREATININE 0.67 0.76  CALCIUM 9.0 8.4     Studies/Results: Ct Abdomen Pelvis W Contrast  09/02/2013   CLINICAL DATA:  Pain.  EXAM: CT ABDOMEN AND PELVIS WITH CONTRAST  TECHNIQUE: Multidetector CT imaging of the abdomen and pelvis was performed using the standard protocol following bolus administration of intravenous contrast.  CONTRAST:  25mL OMNIPAQUE IOHEXOL 300 MG/ML SOLN, OMNIPAQUE IOHEXOL 300 MG/ML SOLN  COMPARISON:  Abdominal series 125 2015.  Ultrasound 06/06/2013.  FINDINGS: Liver normal. Spleen is normal. Pancreas normal. Gallbladder is nondistended. No biliary distention. Portal vein and splenic vein are patent.  Adrenals normal. Kidneys are normal. No evidence of  hydronephrosis or obstructing ureteral stone. Pelvic phleboliths. The bladder is nondistended. Mild bladder wall thickening is noted. This may be from lack of bladder distention however process such as cystitis cannot be excluded. Tiny amount of air may be present in the bladder. This could be seen with bladder infection or from instrumentation. Uterus unremarkable. Tubal ligation clips noted on the left. No tubal ligation clips on the right. 3.6 x 3.1 cm right adnexal cystic mass is noted. This cyst most likely an ovarian cyst. To exclude a ectopic pregnancy, pregnancy testis suggested. Pelvic ultrasound may also prove useful 4 further evaluation. Small amount of free pelvic fluid is present.  No adenopathy. Aorta widely patent and of normal caliber. Visceral vessels are patent.  Appendix is difficult to visualize. What appears to be the appendix is normal. The colon is nondistended. Stool present in the colon. No small bowel distention. No gastric distention. Patient has had prior gastric surgery. No significant hernia.  Lung bases are clear. Heart size normal. No acute bony abnormality. Bilateral sacroiliac degenerative change present. Bilateral hip DJD. Mild degenerative changes lumbar spine.  IMPRESSION: 1. Mild bladder wall thickening. This may be from lack of distension. Small amount of air may be be present in the bladder. This may be from instrumentation. These findings can also be seen with bladder infection. 2. 3.6 x 3.1 cm right adnexal cystic mass. This is most likely a simple ovarian cyst. To exclude ectopic pregnancy, pregnancy test is suggested. Small amount of  free pelvic fluid. Pelvic ultrasound can also be obtained. 3. Patient appears to have had a tubal ligation. No tubal ligation clip noted on the right. 4. Prior gastric surgery.  No bowel distention.   Electronically Signed   By: Maisie Fushomas  Register   On: 09/02/2013 19:30   Dg Abd Acute W/chest  09/02/2013   CLINICAL DATA:  Status post gastric  sleeve 08/08/2013, diffuse abdominal pain  EXAM: ACUTE ABDOMEN SERIES (ABDOMEN 2 VIEW & CHEST 1 VIEW)  COMPARISON:  08/09/2013, 06/06/2013  FINDINGS: Low lung volumes noted. This accentuates the heart size and vascularity. Negative for CHF or pneumonia. No effusion or pneumothorax. Negative for free air.  Scattered air and stool throughout the bowel. No significant dilatation or obstruction. Postop changes from gastric sleeve procedure.  IMPRESSION: No acute finding in the chest or abdomen by plain radiography   Electronically Signed   By: Ruel Favorsrevor  Shick M.D.   On: 09/02/2013 17:42   [place] Abdomen Limited Ruq  09/03/2013   CLINICAL DATA:  Right upper quadrant pain, recent gastric surgery  EXAM: US ABDOMEN LIMITED - RIGHT UPPER QUADRANT  COMPARISON:  None.  FINDINGS: Gallbladder:  Gallbladder sludge is noted.  No definitive calculi are seen.  Common bile duct:  Diameter: 3.3 mm.  Liver:  No focal lesion identified. Within normal limits in parenchymal echogenicity.  IMPRESSION: No acute abnormality is noted.   Electronically Signed   By: Alcide CleverMark  Lukens M.D.   On: 09/03/2013 13:22    Anti-infectives: Anti-infectives   None      Assessment/Plan: Acute epigastric and right upper quadrant pain now about 5 weeks following a sleeve gastrectomy. Nothing on her CT scan or lab work leads me to think she has a significant complication from her sleeve such as leak or obstruction. Her symptoms are suggestive of gallbladder disease. She also has had mildly elevated transaminases and lipase on admission. We will go ahead with an ultrasound of the gallbladder today.    LOS: 1 day    [name] T 09/03/2013

## 2013-09-03 NOTE — Progress Notes (Signed)
Pt continues to c/o "severe" epigastric pain which awakens her from sleep. Will call MD for orders

## 2013-09-03 NOTE — Progress Notes (Signed)
UR completed. Patient changed to inpatient- requiring IVF @ 100cc/hr and IV pain medication 

## 2013-09-04 ENCOUNTER — Inpatient Hospital Stay (HOSPITAL_COMMUNITY): Payer: BC Managed Care – PPO

## 2013-09-04 MED ORDER — TECHNETIUM TC 99M MEBROFENIN IV KIT
5.3000 | PACK | Freq: Once | INTRAVENOUS | Status: AC | PRN
  Administered 2013-09-04: 5 via INTRAVENOUS

## 2013-09-04 NOTE — Progress Notes (Signed)
Patient ID: Robyn FurbishJessica Bryant, female   DOB: 09/16/1975, 38 y.o.   MRN: [id number]    Subjective: Patient reports some slight improvement in her right upper quadrant pain. She still has quite a bit of burning discomfort substernally when she tries to drink in and this will make her right upper quadrant pain worse as well. Overall though she does feel slightly improved  Objective: Vital signs in last 24 hours: Temp:  [97.8 F (36.6 C)-98.6 F (37 C)] 97.8 F (36.6 C) (01/27 0531) Pulse Rate:  [60-75] 62 (01/27 0531) Resp:  [18] 18 (01/27 0531) BP: (95-120)/(53-60) 95/59 mmHg (01/27 0531) SpO2:  [96 %-99 %] 96 % (01/27 0531) Last BM Date: 09/02/13  Intake/Output from previous day: 01/26 0701 - 01/27 0700 In: 2880 [P.O.:540; I.V.:2340] Out: 2000 [Urine:2000] Intake/Output this shift: Total I/O In: -  Out: 250 [Urine:250]  General appearance: alert, cooperative and no distress GI: she has tenderness in the right upper quadrant but less than yesterday without guarding or peritoneal signs  Lab Results:   Recent Labs  09/02/13 1625 09/03/13 0504  WBC 6.1 5.2  HGB 13.4 11.5*  HCT 39.7 35.4*  PLT 257 214   BMET  Recent Labs  09/02/13 1625 09/03/13 0504  NA 142 142  K 3.3* 3.5*  CL 101 104  CO2 23 24  GLUCOSE 84 69*  BUN 9 7  CREATININE 0.67 0.76  CALCIUM 9.0 8.4     Studies/Results: Nm Hepatobiliary  09/04/2013   CLINICAL DATA:  Concern for cholecystitis, gallbladder sludge. Normal common bile duct diameter on ultrasound  EXAM: NUCLEAR MEDICINE HEPATOBILIARY IMAGING  TECHNIQUE: Sequential images of the abdomen were obtained out to 60 minutes following intravenous administration of radiopharmaceutical.  COMPARISON:  US ABDOMEN LIMITED RUQ/ASCITES dated 09/03/2013; CT ABD/PELVIS W CM dated 09/02/2013  RADIOPHARMACEUTICALS:  5.3 mCi Tc-502m Choletec  FINDINGS: There is rapid clearance of radiotracer from the blood pool and homogeneous uptake within the liver. The gallbladder  is present by 40 min. There is some delay in transit into the bowel with no bowel activity by 120 min.  IMPRESSION: Filling of the gallbladder is consistent with a patent cystic duct. No evidence of cholecystitis.   Electronically Signed   By: Genevive BiStewart  Edmunds M.D.   On: 09/04/2013 10:54   Ct Abdomen Pelvis W Contrast  09/02/2013   CLINICAL DATA:  Pain.  EXAM: CT ABDOMEN AND PELVIS WITH CONTRAST  TECHNIQUE: Multidetector CT imaging of the abdomen and pelvis was performed using the standard protocol following bolus administration of intravenous contrast.  CONTRAST:  25mL OMNIPAQUE IOHEXOL 300 MG/ML SOLN, 100mL OMNIPAQUE IOHEXOL 300 MG/ML SOLN  COMPARISON:  Abdominal series 125 2015.  Ultrasound 06/06/2013.  FINDINGS: Liver normal. Spleen is normal. Pancreas normal. Gallbladder is nondistended. No biliary distention. Portal vein and splenic vein are patent.  Adrenals normal. Kidneys are normal. No evidence of hydronephrosis or obstructing ureteral stone. Pelvic phleboliths. The bladder is nondistended. Mild bladder wall thickening is noted. This may be from lack of bladder distention however process such as cystitis cannot be excluded. Tiny amount of air may be present in the bladder. This could be seen with bladder infection or from instrumentation. Uterus unremarkable. Tubal ligation clips noted on the left. No tubal ligation clips on the right. 3.6 x 3.1 cm right adnexal cystic mass is noted. This cyst most likely an ovarian cyst. To exclude a ectopic pregnancy, pregnancy testis suggested. Pelvic ultrasound may also prove useful 4 further evaluation. Small amount of free  pelvic fluid is present.  No adenopathy. Aorta widely patent and of normal caliber. Visceral vessels are patent.  Appendix is difficult to visualize. What appears to be the appendix is normal. The colon is nondistended. Stool present in the colon. No small bowel distention. No gastric distention. Patient has had prior gastric surgery. No  significant hernia.  Lung bases are clear. Heart size normal. No acute bony abnormality. Bilateral sacroiliac degenerative change present. Bilateral hip DJD. Mild degenerative changes lumbar spine.  IMPRESSION: 1. Mild bladder wall thickening. This may be from lack of distension. Small amount of air may be be present in the bladder. This may be from instrumentation. These findings can also be seen with bladder infection. 2. 3.6 x 3.1 cm right adnexal cystic mass. This is most likely a simple ovarian cyst. To exclude ectopic pregnancy, pregnancy test is suggested. Small amount of free pelvic fluid. Pelvic ultrasound can also be obtained. 3. Patient appears to have had a tubal ligation. No tubal ligation clip noted on the right. 4. Prior gastric surgery.  No bowel distention.   Electronically Signed   By: Maisie Fus  Register   On: 09/02/2013 19:30   Dg Abd Acute W/chest  09/02/2013   CLINICAL DATA:  Status post gastric sleeve 08/08/2013, diffuse abdominal pain  EXAM: ACUTE ABDOMEN SERIES (ABDOMEN 2 VIEW & CHEST 1 VIEW)  COMPARISON:  08/09/2013, 06/06/2013  FINDINGS: Low lung volumes noted. This accentuates the heart size and vascularity. Negative for CHF or pneumonia. No effusion or pneumothorax. Negative for free air.  Scattered air and stool throughout the bowel. No significant dilatation or obstruction. Postop changes from gastric sleeve procedure.  IMPRESSION: No acute finding in the chest or abdomen by plain radiography   Electronically Signed   By: Ruel Favors M.D.   On: 09/02/2013 17:42   US Abdomen Limited Ruq  09/03/2013   CLINICAL DATA:  Right upper quadrant pain, recent gastric surgery  EXAM: US ABDOMEN LIMITED - RIGHT UPPER QUADRANT  COMPARISON:  None.  FINDINGS: Gallbladder:  Gallbladder sludge is noted.  No definitive calculi are seen.  Common bile duct:  Diameter: 3.3 mm.  Liver:  No focal lesion identified. Within normal limits in parenchymal echogenicity.  IMPRESSION: No acute abnormality is  noted.   Electronically Signed   By: Alcide Clever M.D.   On: 09/03/2013 13:22    Anti-infectives: Anti-infectives   None      Assessment/Plan: Right upper quadrant and substernal pain several weeks post a sleeve gastrectomy. Etiology remains somewhat unclear. Gallbladder ultrasound showed sludge but no stones in HIDA scan is normal. Still cannot rule out gallbladder disease but this seems much less likely. She may have some reflux and/or gastritis. She is on proton pump inhibitors. The patient does not appear ill and has a relatively benign abdomen and normal lab work. She is improving slightly. Would consider EGD if she does not improve but as she is a little bit better today I recommended continued proton pump inhibitors and symptomatic management today and observation to see if this is beginning to improve with that treatment. She understands and agrees all her questions answered.    LOS: 2 days    Magie Ciampa T 09/04/2013

## 2013-09-05 LAB — CBC
HEMATOCRIT: 38 % (ref 36.0–46.0)
Hemoglobin: 12.5 g/dL (ref 12.0–15.0)
MCH: 27.2 pg (ref 26.0–34.0)
MCHC: 32.9 g/dL (ref 30.0–36.0)
MCV: 82.6 fL (ref 78.0–100.0)
Platelets: 199 10*3/uL (ref 150–400)
RBC: 4.6 MIL/uL (ref 3.87–5.11)
RDW: 15.1 % (ref 11.5–15.5)
WBC: 3.7 10*3/uL — AB (ref 4.0–10.5)

## 2013-09-05 LAB — COMPREHENSIVE METABOLIC PANEL
ALBUMIN: 2.6 g/dL — AB (ref 3.5–5.2)
ALK PHOS: 61 U/L (ref 39–117)
ALT: 36 U/L — AB (ref 0–35)
AST: 18 U/L (ref 0–37)
BILIRUBIN TOTAL: 0.5 mg/dL (ref 0.3–1.2)
CHLORIDE: 101 meq/L (ref 96–112)
CO2: 28 mEq/L (ref 19–32)
Calcium: 7.9 mg/dL — ABNORMAL LOW (ref 8.4–10.5)
Creatinine, Ser: 0.76 mg/dL (ref 0.50–1.10)
GFR calc Af Amer: >90 mL/min (ref >90)
GFR calc non Af Amer: >90 mL/min (ref >90)
Glucose, Bld: 103 mg/dL — ABNORMAL HIGH (ref 70–99)
Potassium: 3.3 mEq/L — ABNORMAL LOW (ref 3.7–5.3)
SODIUM: 138 meq/L (ref 137–147)
Total Protein: 5.5 g/dL — ABNORMAL LOW (ref 6.0–8.3)

## 2013-09-05 LAB — LIPASE, BLOOD: Lipase: 43 U/L (ref 11–59)

## 2013-09-05 MED ORDER — POTASSIUM CHLORIDE 10 MEQ/100ML IV SOLN
10.0000 meq | INTRAVENOUS | Status: AC
  Administered 2013-09-05 (×2): 10 meq via INTRAVENOUS
  Filled 2013-09-05 (×2): qty 100

## 2013-09-05 MED ORDER — POTASSIUM CHLORIDE IN NACL 20-0.9 MEQ/L-% IV SOLN
INTRAVENOUS | Status: DC
  Administered 2013-09-05: 20 mL/h via INTRAVENOUS
  Administered 2013-09-06 – 2013-09-08 (×4): via INTRAVENOUS
  Administered 2013-09-09: 1000 mL via INTRAVENOUS
  Administered 2013-09-09: 05:00:00 via INTRAVENOUS
  Filled 2013-09-05 (×11): qty 1000

## 2013-09-05 MED ORDER — SUCRALFATE 1 GM/10ML PO SUSP
1.0000 g | Freq: Three times a day (TID) | ORAL | Status: DC
  Administered 2013-09-05 – 2013-09-10 (×13): 1 g via ORAL
  Filled 2013-09-05 (×24): qty 10

## 2013-09-05 NOTE — Progress Notes (Signed)
Patient attempted to drink unjury shake after not being able to tolerate breakfast, patient stated that the shake made her nauseous and still caused abd pain. Will continue to monitor patient. C.Jadden Yim,RN

## 2013-09-05 NOTE — Progress Notes (Signed)
Patient called RN to room, she had ordered scrambled egg for breakfast,but states she is unable to tolerate it due to pain. Patient C/o burning in epigastric region as well as states she feels like the egg is stuck and that there is a knot in her stomach. Karilyn CotaLaurie Deaton,RN and Dr. Johna SheriffHoxworth were both made aware of this. Will continue to monitor patient. Lum Keas. Xavion Muscat,RN

## 2013-09-05 NOTE — ED Provider Notes (Signed)
Medical screening examination/treatment/procedure(s) were performed by non-physician practitioner and as supervising physician I was immediately available for consultation/collaboration.  EKG Interpretation    Date/Time:    Ventricular Rate:    PR Interval:    QRS Duration:   QT Interval:    QTC Calculation:   R Axis:     Text Interpretation:                Richardean Canalavid H Yao, MD 09/05/13 1904

## 2013-09-05 NOTE — Progress Notes (Signed)
Patient did not tolerate egg from bariatric advanced diet, discussed with patient and provided with Chicken Soup protein shake to see if it was better tolerated.  Patient to wait 20 to 30 minutes to allow time for egg to move from pouch before trying the shake.  Will continue to monitor.

## 2013-09-05 NOTE — Progress Notes (Signed)
Patient ID: Robyn Bryant, female   DOB: Jan 06, 1976, 38 y.o.   MRN: [id number]    Subjective: Gradually feeling better. Her right upper quadrant pain is definitely improved, now minimal. She still has some sternal burning liquids but relieved with Mylanta and she is tolerating clear liquids without nausea or vomiting.  Objective: Vital signs in last 24 hours: Temp:  [98.1 F (36.7 C)-98.6 F (37 C)] 98.6 F (37 C) (01/28 0553) Pulse Rate:  [65-67] 65 (01/28 0553) Resp:  [18] 18 (01/28 0553) BP: (93-109)/(63-70) 93/69 mmHg (01/28 0553) SpO2:  [98 %-100 %] 99 % (01/28 0553) Last BM Date: 09/02/13  Intake/Output from previous day: 01/27 0701 - 01/28 0700 In: 2249.6 [P.O.:1320; I.V.:929.6] Out: 4250 [Urine:4250] Intake/Output this shift:    General appearance: alert, cooperative and no distress GI: today no significant upper abdominal tenderness. No guarding.  Lab Results:   Recent Labs  09/03/13 0504 09/05/13 0540  WBC 5.2 3.7*  HGB 11.5* 12.5  HCT 35.4* 38.0  PLT 214 199   BMET  Recent Labs  09/03/13 0504 09/05/13 0540  NA 142 138  K 3.5* 3.3*  CL 104 101  CO2 24 28  GLUCOSE 69* 103*  BUN 7 <3*  CREATININE 0.76 0.76  CALCIUM 8.4 7.9*   Lab Results  Component Value Date   ALT 36* 09/05/2013   AST 18 09/05/2013   ALKPHOS 61 09/05/2013   BILITOT 0.5 09/05/2013   Lab Results  Component Value Date   LIPASE 43 09/05/2013     Studies/Results: Nm Hepatobiliary  09/04/2013   CLINICAL DATA:  Concern for cholecystitis, gallbladder sludge. Normal common bile duct diameter on ultrasound  EXAM: NUCLEAR MEDICINE HEPATOBILIARY IMAGING  TECHNIQUE: Sequential images of the abdomen were obtained out to 60 minutes following intravenous administration of radiopharmaceutical.  COMPARISON:  US ABDOMEN LIMITED RUQ/ASCITES dated 09/03/2013; CT ABD/PELVIS W CM dated 09/02/2013  RADIOPHARMACEUTICALS:  5.3 mCi Tc-74m Choletec  FINDINGS: There is rapid clearance of radiotracer from the  blood pool and homogeneous uptake within the liver. The gallbladder is present by 40 min. There is some delay in transit into the bowel with no bowel activity by 120 min.  IMPRESSION: Filling of the gallbladder is consistent with a patent cystic duct. No evidence of cholecystitis.   Electronically Signed   By: Genevive Bi M.D.   On: 09/04/2013 10:54   US Abdomen Limited Ruq  09/03/2013   CLINICAL DATA:  Right upper quadrant pain, recent gastric surgery  EXAM: US ABDOMEN LIMITED - RIGHT UPPER QUADRANT  COMPARISON:  None.  FINDINGS: Gallbladder:  Gallbladder sludge is noted.  No definitive calculi are seen.  Common bile duct:  Diameter: 3.3 mm.  Liver:  No focal lesion identified. Within normal limits in parenchymal echogenicity.  IMPRESSION: No acute abnormality is noted.   Electronically Signed   By: Alcide Clever M.D.   On: 09/03/2013 13:22    Anti-infectives: Anti-infectives   None      Assessment/Plan: Admitted with episode of epigastric substernal and right upper quadrant abdominal pain. Initially he had minimally elevated lipase and LFTs and sludge in her gallbladder. HIDA was normal. No gallstones. This may have been symptomatic gallbladder disease or even small passed common bile duct stone. Workup however is essentially negative the lab unremarkable female. She may also have some gastritis and reflux esophagitis independent from this or secondary to this. She however is steadily improving and without definite evidence of gallbladder disease I would not recommend surgical intervention. Continue  to follow nonoperatively as long as she is improving. Advance diet today and possible discharge later today or tomorrow.    LOS: 3 days    Haylin Camilli T 09/05/2013

## 2013-09-06 ENCOUNTER — Encounter (HOSPITAL_COMMUNITY): Payer: Self-pay

## 2013-09-06 ENCOUNTER — Encounter (HOSPITAL_COMMUNITY)
Admission: EM | Disposition: A | Discharge status: Home / Self Care | Payer: Self-pay | Source: Home / Self Care | Attending: General Surgery

## 2013-09-06 DIAGNOSIS — K3189 Other diseases of stomach and duodenum: Secondary | ICD-10-CM

## 2013-09-06 DIAGNOSIS — R109 Unspecified abdominal pain: Secondary | ICD-10-CM

## 2013-09-06 DIAGNOSIS — R1013 Epigastric pain: Secondary | ICD-10-CM

## 2013-09-06 HISTORY — PX: ESOPHAGOGASTRODUODENOSCOPY: SHX5428

## 2013-09-06 SURGERY — EGD (ESOPHAGOGASTRODUODENOSCOPY)
Anesthesia: Moderate Sedation

## 2013-09-06 MED ORDER — BUTAMBEN-TETRACAINE-BENZOCAINE 2-2-14 % EX AERO
INHALATION_SPRAY | CUTANEOUS | Status: DC | PRN
  Administered 2013-09-06: 1 via TOPICAL
  Administered 2013-09-06: 2 via TOPICAL

## 2013-09-06 MED ORDER — DIPHENHYDRAMINE HCL 50 MG/ML IJ SOLN
INTRAMUSCULAR | Status: AC
  Filled 2013-09-06: qty 1

## 2013-09-06 MED ORDER — FENTANYL CITRATE 0.05 MG/ML IJ SOLN
INTRAMUSCULAR | Status: AC
  Filled 2013-09-06: qty 2

## 2013-09-06 MED ORDER — FENTANYL CITRATE 0.05 MG/ML IJ SOLN
INTRAMUSCULAR | Status: DC | PRN
  Administered 2013-09-06 (×3): 25 ug via INTRAVENOUS

## 2013-09-06 MED ORDER — MIDAZOLAM HCL 10 MG/2ML IJ SOLN
INTRAMUSCULAR | Status: AC
Start: 2013-09-06 — End: 2013-09-06
  Filled 2013-09-06: qty 2

## 2013-09-06 MED ORDER — MIDAZOLAM HCL 10 MG/2ML IJ SOLN
INTRAMUSCULAR | Status: DC | PRN
  Administered 2013-09-06 (×2): 2 mg via INTRAVENOUS
  Administered 2013-09-06: 1 mg via INTRAVENOUS

## 2013-09-06 NOTE — Progress Notes (Signed)
Patient ID: Robyn FurbishJessica Nogueras, female   DOB: 12/16/1975, 38 y.o.   MRN: [id number]    Subjective: Did not do well with her diet yesterday. Solids and liquids caused burning substernal and epigastric pain and she had 2 episodes of vomiting. Also had some increase in her right upper quadrant pain with food.  Objective: Vital signs in last 24 hours: Temp:  [98 F (36.7 C)-98.9 F (37.2 C)] 98 F (36.7 C) (01/29 0603) Pulse Rate:  [64-79] 64 (01/29 0603) Resp:  [18] 18 (01/29 0603) BP: (100-120)/(56-73) 100/56 mmHg (01/29 0603) SpO2:  [94 %-98 %] 94 % (01/29 0603) Last BM Date: 09/02/13  Intake/Output from previous day: 01/28 0701 - 01/29 0700 In: 1020 [P.O.:540; I.V.:480] Out: 2250 [Urine:2250] Intake/Output this shift:    General appearance: cooperative, fatigued and no distress GI: mild epigastric and right upper quadrant tenderness without peritoneal signs  Lab Results:   Recent Labs  09/05/13 0540  WBC 3.7*  HGB 12.5  HCT 38.0  PLT 199   BMET  Recent Labs  09/05/13 0540  NA 138  K 3.3*  CL 101  CO2 28  GLUCOSE 103*  BUN <3*  CREATININE 0.76  CALCIUM 7.9*     Studies/Results: Nm Hepatobiliary  09/04/2013   CLINICAL DATA:  Concern for cholecystitis, gallbladder sludge. Normal common bile duct diameter on ultrasound  EXAM: NUCLEAR MEDICINE HEPATOBILIARY IMAGING  TECHNIQUE: Sequential images of the abdomen were obtained out to 60 minutes following intravenous administration of radiopharmaceutical.  COMPARISON:  US ABDOMEN LIMITED RUQ/ASCITES dated 09/03/2013; CT ABD/PELVIS W CM dated 09/02/2013  RADIOPHARMACEUTICALS:  5.3 mCi Tc-7058m Choletec  FINDINGS: There is rapid clearance of radiotracer from the blood pool and homogeneous uptake within the liver. The gallbladder is present by 40 min. There is some delay in transit into the bowel with no bowel activity by 120 min.  IMPRESSION: Filling of the gallbladder is consistent with a patent cystic duct. No evidence of  cholecystitis.   Electronically Signed   By: Genevive BiStewart  Edmunds M.D.   On: 09/04/2013 10:54    Anti-infectives: Anti-infectives   None      Assessment/Plan: One month status post laparoscopic sleeve gastrectomy with initially excellent course but now presents with persistent epigastric and substernal burning pain and right upper quadrant pain as well with negative CT, initially mildly elevated LFTs and lipase had returned to normal and sludge in the gallbladder but negative HIDA. She seemed to be improving on proton pump inhibitors and Carafate until yesterday. I have discussed with Dr. Ezzard StandingNewman proceeding with upper endoscopy to further evaluate her sleeve. If this is unrevealing for a source of her symptoms I would recommend laparoscopy and cholecystectomy for possible cholecystitis. This was discussed with the patient who is in agreement.     LOS: 4 days    Clerence Gubser T 09/06/2013

## 2013-09-06 NOTE — Op Note (Signed)
09/02/2013 - 09/06/2013  1:12 PM  PATIENT:  Robyn Bryant, 38 y.o., female, MRN: [id number]  PREOP DIAGNOSIS:  Epigastric pain, s/p sleeve gastrectomy  POSTOP DIAGNOSIS:   s/p sleeve gastrectomy, normal anatomy with spasm  PROCEDURE:  Esophagogastroduodenoscopy  SURGEON:   Ovidio Kinavid Liddie Chichester, M.D.  ANESTHESIA:   Fentanyl  50 mcg   Versed 5 mg  INDICATIONS FOR PROCEDURE:  Robyn Bryant is a 38 y.o. (DOB: 08/09/1975)  hispanic  female whose primary care physician is Sanda Lingerhomas [name], MD and comes for upper endoscopy to evaluate her sleeve gastrectomy.  The patient had a gastric sleeve on 08/08/2013 by Dr. Johna SheriffHoxworth.   The indications and risks of the endoscopy were explained to the patient.  The risks include, but are not limited to, perforation, bleeding, or injury to the bowel.  If balloon dilatation is needed, the risk of perforation is higher.  PROCEDURE:  [facility]  The patient was monitored with a pulse oximetry, BP cuff, and EKG.  The patient has nasal O2 flowing during the procedure.   The back of the throat was anesthestized with Ceticaine x 3.  The patient was positioned in the left lateral decubitus position.  The patient was given Fentanyl and Versed.  A flexible Pentax endoscope was passed down the throat without difficulty.    There was some spasm of the stomach pouch.  On manipulating the proximal pouch the patient became tachycardic, but once through the spasm, the area relaxed and the patient's pulse returned to normal.  Findings include:   Esophagus:   Normal   GE junction at:  35 cm   Stomach pouch: Some spasm in the proximal 1/3 of pouch which relaxed as the procedure progressed   Distal staple line of sleeve:   45 cm   Pylorus distance:  50 cm   Duodenum:  Normal   CLO test:  Not done  PLAN:   Photos taken and given to patient.    Discussed with Dr. Johna SheriffHoxworth.  She is admitted to the hospital.  Ovidio Kinavid Winola Drum, MD,  [facility] Pager: [phone] Office phone:  [phone]

## 2013-09-07 ENCOUNTER — Inpatient Hospital Stay (HOSPITAL_COMMUNITY): Payer: BC Managed Care – PPO

## 2013-09-07 ENCOUNTER — Inpatient Hospital Stay (HOSPITAL_COMMUNITY): Payer: BC Managed Care – PPO | Admitting: Certified Registered"

## 2013-09-07 ENCOUNTER — Encounter (HOSPITAL_COMMUNITY)
Admission: EM | Disposition: A | Discharge status: Home / Self Care | Payer: Self-pay | Source: Home / Self Care | Attending: General Surgery

## 2013-09-07 ENCOUNTER — Encounter (HOSPITAL_COMMUNITY): Payer: Self-pay | Admitting: Surgery

## 2013-09-07 ENCOUNTER — Encounter (HOSPITAL_COMMUNITY): Payer: BC Managed Care – PPO | Admitting: Certified Registered"

## 2013-09-07 DIAGNOSIS — K810 Acute cholecystitis: Secondary | ICD-10-CM

## 2013-09-07 DIAGNOSIS — K824 Cholesterolosis of gallbladder: Secondary | ICD-10-CM

## 2013-09-07 HISTORY — PX: CHOLECYSTECTOMY: SHX55

## 2013-09-07 LAB — SURGICAL PCR SCREEN
MRSA, PCR: NEGATIVE
STAPHYLOCOCCUS AUREUS: NEGATIVE

## 2013-09-07 SURGERY — LAPAROSCOPIC CHOLECYSTECTOMY WITH INTRAOPERATIVE CHOLANGIOGRAM
Anesthesia: General | Site: Abdomen

## 2013-09-07 MED ORDER — HYDROMORPHONE HCL PF 1 MG/ML IJ SOLN
1.0000 mg | INTRAMUSCULAR | Status: DC | PRN
  Administered 2013-09-07 (×2): 1 mg via INTRAVENOUS
  Administered 2013-09-07: 2 mg via INTRAVENOUS
  Administered 2013-09-08: 0.5 mg via INTRAVENOUS
  Administered 2013-09-08: 1 mg via INTRAVENOUS
  Administered 2013-09-08: 2 mg via INTRAVENOUS
  Administered 2013-09-08 (×2): 1 mg via INTRAVENOUS
  Administered 2013-09-08 (×2): 2 mg via INTRAVENOUS
  Administered 2013-09-08 – 2013-09-10 (×4): 1 mg via INTRAVENOUS
  Filled 2013-09-07 (×3): qty 1
  Filled 2013-09-07: qty 2
  Filled 2013-09-07 (×2): qty 1
  Filled 2013-09-07: qty 2
  Filled 2013-09-07 (×2): qty 1
  Filled 2013-09-07 (×2): qty 2
  Filled 2013-09-07 (×3): qty 1

## 2013-09-07 MED ORDER — ONDANSETRON HCL 4 MG/2ML IJ SOLN
INTRAMUSCULAR | Status: AC
  Filled 2013-09-07: qty 2

## 2013-09-07 MED ORDER — PROPOFOL 10 MG/ML IV BOLUS
INTRAVENOUS | Status: DC | PRN
  Administered 2013-09-07: 150 mg via INTRAVENOUS

## 2013-09-07 MED ORDER — 0.9 % SODIUM CHLORIDE (POUR BTL) OPTIME
TOPICAL | Status: DC | PRN
  Administered 2013-09-07: 1000 mL

## 2013-09-07 MED ORDER — DEXAMETHASONE SODIUM PHOSPHATE 10 MG/ML IJ SOLN
INTRAMUSCULAR | Status: DC | PRN
  Administered 2013-09-07: 10 mg via INTRAVENOUS

## 2013-09-07 MED ORDER — CEFAZOLIN SODIUM-DEXTROSE 2-3 GM-% IV SOLR
2.0000 g | INTRAVENOUS | Status: AC
  Administered 2013-09-07: 2 g via INTRAVENOUS

## 2013-09-07 MED ORDER — GLYCOPYRROLATE 0.2 MG/ML IJ SOLN
INTRAMUSCULAR | Status: AC
  Filled 2013-09-07: qty 3

## 2013-09-07 MED ORDER — DEXAMETHASONE SODIUM PHOSPHATE 10 MG/ML IJ SOLN
INTRAMUSCULAR | Status: AC
  Filled 2013-09-07: qty 1

## 2013-09-07 MED ORDER — SUCCINYLCHOLINE CHLORIDE 20 MG/ML IJ SOLN
INTRAMUSCULAR | Status: DC | PRN
  Administered 2013-09-07: 100 mg via INTRAVENOUS

## 2013-09-07 MED ORDER — DIPHENHYDRAMINE HCL 50 MG/ML IJ SOLN
INTRAMUSCULAR | Status: AC
  Filled 2013-09-07: qty 1

## 2013-09-07 MED ORDER — GLYCOPYRROLATE 0.2 MG/ML IJ SOLN
INTRAMUSCULAR | Status: DC | PRN
  Administered 2013-09-07: 0.6 mg via INTRAVENOUS

## 2013-09-07 MED ORDER — BUPIVACAINE HCL 0.25 % IJ SOLN
INTRAMUSCULAR | Status: DC | PRN
Start: 2013-09-07 — End: 2013-09-07
  Administered 2013-09-07: 5 mL

## 2013-09-07 MED ORDER — NEOSTIGMINE METHYLSULFATE 1 MG/ML IJ SOLN
INTRAMUSCULAR | Status: DC | PRN
  Administered 2013-09-07: 4 mg via INTRAVENOUS

## 2013-09-07 MED ORDER — ROCURONIUM BROMIDE 100 MG/10ML IV SOLN
INTRAVENOUS | Status: AC
  Filled 2013-09-07: qty 1

## 2013-09-07 MED ORDER — SUCCINYLCHOLINE CHLORIDE 20 MG/ML IJ SOLN
INTRAMUSCULAR | Status: AC
  Filled 2013-09-07: qty 1

## 2013-09-07 MED ORDER — METOCLOPRAMIDE HCL 5 MG/ML IJ SOLN: 10.0000 mg | Freq: Once | INTRAMUSCULAR | Status: AC | PRN

## 2013-09-07 MED ORDER — FENTANYL CITRATE 0.05 MG/ML IJ SOLN
INTRAMUSCULAR | Status: AC
  Filled 2013-09-07: qty 2

## 2013-09-07 MED ORDER — MIDAZOLAM HCL 5 MG/5ML IJ SOLN
INTRAMUSCULAR | Status: DC | PRN
  Administered 2013-09-07: 2 mg via INTRAVENOUS

## 2013-09-07 MED ORDER — IOHEXOL 300 MG/ML  SOLN
INTRAMUSCULAR | Status: DC | PRN
  Administered 2013-09-07: 50 mL

## 2013-09-07 MED ORDER — MEPERIDINE HCL 50 MG/ML IJ SOLN: 6.2500 mg | INTRAMUSCULAR | Status: DC | PRN

## 2013-09-07 MED ORDER — LACTATED RINGERS IV SOLN
INTRAVENOUS | Status: DC | PRN
  Administered 2013-09-07: 20:00:00 via INTRAVENOUS

## 2013-09-07 MED ORDER — LACTATED RINGERS IR SOLN
Status: DC | PRN
  Administered 2013-09-07: 1000 mL

## 2013-09-07 MED ORDER — LABETALOL HCL 5 MG/ML IV SOLN
INTRAVENOUS | Status: AC
  Filled 2013-09-07: qty 4

## 2013-09-07 MED ORDER — FENTANYL CITRATE 0.05 MG/ML IJ SOLN
INTRAMUSCULAR | Status: AC
  Filled 2013-09-07: qty 5

## 2013-09-07 MED ORDER — BUPIVACAINE HCL (PF) 0.25 % IJ SOLN
INTRAMUSCULAR | Status: AC
  Filled 2013-09-07: qty 30

## 2013-09-07 MED ORDER — MIDAZOLAM HCL 2 MG/2ML IJ SOLN
INTRAMUSCULAR | Status: AC
  Filled 2013-09-07: qty 2

## 2013-09-07 MED ORDER — PROPOFOL 10 MG/ML IV BOLUS
INTRAVENOUS | Status: AC
  Filled 2013-09-07: qty 20

## 2013-09-07 MED ORDER — LABETALOL HCL 5 MG/ML IV SOLN
INTRAVENOUS | Status: DC | PRN
  Administered 2013-09-07: 5 mg via INTRAVENOUS

## 2013-09-07 MED ORDER — CEFAZOLIN SODIUM-DEXTROSE 2-3 GM-% IV SOLR
INTRAVENOUS | Status: AC
  Filled 2013-09-07: qty 50

## 2013-09-07 MED ORDER — FENTANYL CITRATE 0.05 MG/ML IJ SOLN
25.0000 ug | INTRAMUSCULAR | Status: DC | PRN
  Administered 2013-09-07 (×3): 50 ug via INTRAVENOUS

## 2013-09-07 MED ORDER — FENTANYL CITRATE 0.05 MG/ML IJ SOLN
INTRAMUSCULAR | Status: DC | PRN
  Administered 2013-09-07 (×3): 50 ug via INTRAVENOUS
  Administered 2013-09-07: 100 ug via INTRAVENOUS

## 2013-09-07 MED ORDER — ONDANSETRON HCL 4 MG/2ML IJ SOLN
INTRAMUSCULAR | Status: DC | PRN
  Administered 2013-09-07: 4 mg via INTRAVENOUS

## 2013-09-07 MED ORDER — ROCURONIUM BROMIDE 100 MG/10ML IV SOLN
INTRAVENOUS | Status: DC | PRN
  Administered 2013-09-07: 30 mg via INTRAVENOUS
  Administered 2013-09-07: 10 mg via INTRAVENOUS

## 2013-09-07 MED ORDER — DIPHENHYDRAMINE HCL 50 MG/ML IJ SOLN
12.5000 mg | Freq: Once | INTRAMUSCULAR | Status: AC
  Administered 2013-09-07: 12.5 mg via INTRAVENOUS

## 2013-09-07 SURGICAL SUPPLY — 39 items
ADH SKN CLS APL DERMABOND .7 (GAUZE/BANDAGES/DRESSINGS) ×1
APPLIER CLIP ROT 10 11.4 M/L (STAPLE) ×2
APR CLP MED LRG 11.4X10 (STAPLE) ×1
BAG SPEC RTRVL LRG 6X4 10 (ENDOMECHANICALS)
CANISTER SUCTION 2500CC (MISCELLANEOUS) ×2 IMPLANT
CATH REDDICK CHOLANGI 4FR 50CM (CATHETERS) IMPLANT
CHLORAPREP W/TINT 26ML (MISCELLANEOUS) ×2 IMPLANT
CLIP APPLIE ROT 10 11.4 M/L (STAPLE) ×1 IMPLANT
COVER MAYO STAND STRL (DRAPES) ×2 IMPLANT
DECANTER SPIKE VIAL GLASS SM (MISCELLANEOUS) ×2 IMPLANT
DERMABOND ADVANCED (GAUZE/BANDAGES/DRESSINGS) ×1
DERMABOND ADVANCED .7 DNX12 (GAUZE/BANDAGES/DRESSINGS) ×1 IMPLANT
DRAPE C-ARM 42X120 X-RAY (DRAPES) ×2 IMPLANT
DRAPE LAPAROSCOPIC ABDOMINAL (DRAPES) ×2 IMPLANT
DRAPE UTILITY XL STRL (DRAPES) ×2 IMPLANT
ELECT REM PT RETURN 9FT ADLT (ELECTROSURGICAL) ×2
ELECTRODE REM PT RTRN 9FT ADLT (ELECTROSURGICAL) ×1 IMPLANT
GLOVE BIOGEL PI IND STRL 7.5 (GLOVE) ×1 IMPLANT
GLOVE BIOGEL PI INDICATOR 7.5 (GLOVE) ×1
GLOVE SS BIOGEL STRL SZ 7.5 (GLOVE) ×1 IMPLANT
GLOVE SUPERSENSE BIOGEL SZ 7.5 (GLOVE) ×1
GOWN STRL REUS W/TWL XL LVL3 (GOWN DISPOSABLE) ×4 IMPLANT
HEMOSTAT SNOW SURGICEL 2X4 (HEMOSTASIS) IMPLANT
KIT BASIN OR (CUSTOM PROCEDURE TRAY) ×2 IMPLANT
NS IRRIG 1000ML POUR BTL (IV SOLUTION) IMPLANT
POUCH SPECIMEN RETRIEVAL 10MM (ENDOMECHANICALS) IMPLANT
SCISSORS LAP 5X35 DISP (ENDOMECHANICALS) ×2 IMPLANT
SET CHOLANGIOGRAPH MIX (MISCELLANEOUS) ×2 IMPLANT
SET IRRIG TUBING LAPAROSCOPIC (IRRIGATION / IRRIGATOR) ×2 IMPLANT
SLEEVE XCEL OPT CAN 5 100 (ENDOMECHANICALS) ×2 IMPLANT
SOLUTION ANTI FOG 6CC (MISCELLANEOUS) ×2 IMPLANT
SUT MNCRL AB 4-0 PS2 18 (SUTURE) ×2 IMPLANT
TOWEL OR 17X26 10 PK STRL BLUE (TOWEL DISPOSABLE) ×2 IMPLANT
TOWEL OR NON WOVEN STRL DISP B (DISPOSABLE) ×2 IMPLANT
TRAY LAP CHOLE (CUSTOM PROCEDURE TRAY) ×2 IMPLANT
TROCAR BLADELESS OPT 5 100 (ENDOMECHANICALS) ×2 IMPLANT
TROCAR XCEL BLUNT TIP 100MML (ENDOMECHANICALS) ×2 IMPLANT
TROCAR XCEL NON-BLD 11X100MML (ENDOMECHANICALS) ×2 IMPLANT
TUBING INSUFFLATION 10FT LAP (TUBING) ×2 IMPLANT

## 2013-09-07 NOTE — Anesthesia Postprocedure Evaluation (Signed)
  Anesthesia Post-op Note  Patient: Robyn Bryant  Procedure(s) Performed: Procedure(s): LAPAROSCOPIC CHOLECYSTECTOMY WITH INTRAOPERATIVE CHOLANGIOGRAM (N/A)  Patient Location: PACU  Anesthesia Type:General  Level of Consciousness: awake, alert  and oriented  Airway and Oxygen Therapy: Patient Spontanous Breathing  Post-op Pain: mild  Post-op Assessment: Post-op Vital signs reviewed, Patient's Cardiovascular Status Stable, Respiratory Function Stable, Patent Airway, No signs of Nausea or vomiting and Pain level controlled  Post-op Vital Signs: Reviewed and stable  Complications: No apparent anesthesia complications

## 2013-09-07 NOTE — Op Note (Signed)
Preoperative Diagnosis: cholecystitis  Postoprative Diagnosis: Same  Procedure: Procedure(s): LAPAROSCOPIC CHOLECYSTECTOMY WITH INTRAOPERATIVE CHOLANGIOGRAM   Surgeon: Glenna FellowsHoxworth, Panfilo Ketchum T   Assistants: Luretha MurphyMatthew Martin  Anesthesia:  General endotracheal anesthesia  Indications: patient is a 38 year old female who is one month status post laparoscopic sleeve gastrectomy for morbid obesity. Her initial postoperative course was very smooth and to 3 weeks she had no pain or GI symptoms. She now presents however with the sudden onset one week ago of persistent epigastric right upper quadrant and substernal pain. She was hospitalized and has had an extensive workup. CT scan was unremarkable with no evidence of leak or inflammatory change around her sleeve gastrectomy. She initially had mildly elevated transaminases and lipase had returned to normal. She has had no evidence of infection. Gallbladder ultrasound some sludge but no definite stones. HIDA scan was negative. She had an upper endoscopy yesterday which showed a patent sleeve gastrectomy and no ulceration or inflammatory change. She continues to have significant right upper quadrant abdominal pain requiring narcotics. It is felt at this point with this above workup and findings that she may well have ongoing low-grade cholecystitis and due to intolerable symptoms I recommended proceeding with laparoscopy for direct examination of her sleeve and cholecystectomy with cholangiogram. We discussed the indications for the procedure and its nature as well as risks of anesthetic complications, bleeding, infection, bile leak and possible failure to relieve her symptoms. She understands and desires to proceed.  Procedure Detail: patient was brought to the operating room, placed in the supine position on the operating table, and general endotracheal anesthesia induced. She received preoperative IV antibiotics. PAS wrong place. She is on subcutaneous Lovenox. The  abdomen was widely sterilely prepped and draped. Patient that was performed and correct procedure verified. I used the previous incision just to the right of the midline in the upper abdomen and bluntly dissected down to the single Vicryl suture at the fascial level which was removed and the fascia incised and peritoneal cavity entered with careful blunt finger dissection. A mattress suture of 0 Vicryl was placed in the [place] trocar placed and pneumoperitoneum established. We then placed 25 mm trochars laterally in the right upper quadrant. An 11 mm trocar was placed subxiphoid angling over toward the gallbladder. The gallbladder had some omental adhesions up to the infundibulum it appeared somewhat thickened and edematous. Bowel loops appeared normal. I carefully examined the gastric sleeve and there was no evidence of significant inflammation along the staple line and the sleeve appeared symmetrical with no twist and no abnormality identified looking all the way up to the EG junction. It appeared slightly edematous throughout as would be expected at this stage postop. Attention was then turned to the gallbladder. The fundus grasped and elevated up over the liver. Some omental adhesions to the infundibulum gallbladder were taken down with careful blunt and cautery dissection and then the infundibulum was retracted inferolaterally. Peritoneum anterior to posterior to close triangle was incised and fibrofatty tissue was stripped off the distal gallbladder toward the porta hepatus. The gallbladder was seen to taper down to the cystic duct. The cystic artery running in close triangle was clearly identified it was divided between 2 proximal and one distal clip. The cystic duct gallbladder junction was encircled 360 and the cystic duct dissected out over about a centimeter. The cystic duct was then clipped at the gallbladder junction and an operative cholangiogram obtained through the cystic duct. This showed normal  common bile duct and intrahepatic ducts with  free flow into the duodenum and no filling defects. The plantar catheter was then removed and the cystic duct was doubly clipped proximally and divided. The gallbladder was then dissected free from its bed using hook cautery. A posterior branch the cystic artery was clipped. The gallbladder was placed in an Endo Catch bag and brought out through the right mid abdominal incision. The right upper quadrant was then thoroughly irrigated and complete hemostasis assured. There was no evidence of trocar injury or other problems. All CO2 was evacuated and trochars removed. A mattress stitch was used to close the fascia at the [facility] trocar site the skin incisions were closed with subcuticular Monocryl and Dermabond.    Findings: As above  Estimated Blood Loss:  Minimal         Drains: none  Blood Given: none          Specimens: gallbladder and contents        Complications:  * No complications entered in OR log *         Disposition: PACU - hemodynamically stable.         Condition: stable

## 2013-09-07 NOTE — Preoperative (Signed)
Beta Blockers   Reason not to administer Beta Blockers:Not Applicable 

## 2013-09-07 NOTE — Progress Notes (Signed)
Patient ID: Robyn Bryant, female   DOB: 03/21/1976, 38 y.o.   MRN: [id number] 1 Day Post-Op  Subjective: Right upper quadrant and epigastric pain is again worse this morning after trying to eat or drink anything. Her substernal burning is relieved with Carafate and not particularly bad currently.  Objective: Vital signs in last 24 hours: Temp:  [97.1 F (36.2 C)-98.1 F (36.7 C)] 97.9 F (36.6 C) (01/30 0600) Pulse Rate:  [60-68] 62 (01/30 0600) Resp:  [16-24] 16 (01/30 0600) BP: (89-117)/(56-77) 89/56 mmHg (01/30 0600) SpO2:  [93 %-98 %] 96 % (01/30 0600) Last BM Date: 09/02/13  Intake/Output from previous day: 01/29 0701 - 01/30 0700 In: 2400 [P.O.:120; I.V.:2280] Out: 1900 [Urine:1900] Intake/Output this shift:    General appearance: alert, cooperative and mild distress GI: moderately tender in the right upper quadrant and epigastrium.  Lab Results:   Recent Labs  09/05/13 0540  WBC 3.7*  HGB 12.5  HCT 38.0  PLT 199   BMET  Recent Labs  09/05/13 0540  NA 138  K 3.3*  CL 101  CO2 28  GLUCOSE 103*  BUN <3*  CREATININE 0.76  CALCIUM 7.9*     Studies/Results: No results found.  Anti-infectives: Anti-infectives   None      Assessment/Plan: s/p Procedure(s): ESOPHAGOGASTRODUODENOSCOPY (EGD) EGD showed just some apparent spasm in the proximal stomach but no significant inflammation or ulcer or stricture. She has persistent right upper quadrant and epigastric pain which is not tolerable. There is some evidence of gallbladder disease with mildly elevated lipase and transaminases on admission and sludge in the gallbladder on ultrasound. At this point I think her biliary tract as the most likely source of her symptoms. I don't think any further workup is possible. Her symptoms are not tolerable. I therefore recommended proceeding with laparoscopy and cholecystectomy with cholangiogram. We could carefully examine the intra-abdominal contents at the time of  her cholecystectomy as well. I discussed the procedure with her including indications and risks of anesthetic complications, bleeding, infection, bile duct, injury to intestines or bile duct with major surgery and the possibility that this would not relieve her symptoms. Patient understands and agrees and we will proceed later today.   LOS: 5 days    Karmello Abercrombie T 09/07/2013

## 2013-09-07 NOTE — Transfer of Care (Signed)
Immediate Anesthesia Transfer of Care Note  Patient: Robyn Bryant  Procedure(s) Performed: Procedure(s) (LRB): LAPAROSCOPIC CHOLECYSTECTOMY WITH INTRAOPERATIVE CHOLANGIOGRAM (N/A)  Patient Location: PACU  Anesthesia Type: General  Level of Consciousness: sedated, patient cooperative and responds to stimulation  Airway & Oxygen Therapy: Patient Spontanous Breathing and Patient connected to face mask oxgen  Post-op Assessment: Report given to PACU RN and Post -op Vital signs reviewed and stable  Post vital signs: Reviewed and stable  Complications: No apparent anesthesia complications

## 2013-09-07 NOTE — Anesthesia Preprocedure Evaluation (Signed)
Anesthesia Evaluation  Patient identified by MRN, date of birth, ID band Patient awake    Reviewed: Allergy & Precautions, H&P , NPO status , Patient's Chart, lab work & pertinent test results  Airway Mallampati: II TM Distance: >3 FB Neck ROM: Full    Dental  (+) Chipped   Pulmonary asthma ,  breath sounds clear to auscultation  Pulmonary exam normal       Cardiovascular negative cardio ROS  Rhythm:Regular Rate:Normal     Neuro/Psych  Neuromuscular disease negative psych ROS   GI/Hepatic negative GI ROS, Neg liver ROS, Cholelithiasis with Acute cholecystitis   Endo/Other  Morbid obesity  Renal/GU negative Renal ROS  negative genitourinary   Musculoskeletal negative musculoskeletal ROS (+)   Abdominal (+) + obese,  Abdomen: tender.    Peds  Hematology   Anesthesia Other Findings   Reproductive/Obstetrics negative OB ROS                           Anesthesia Physical Anesthesia Plan  ASA: III and emergent  Anesthesia Plan: General   Post-op Pain Management:    Induction: Intravenous, Rapid sequence and Cricoid pressure planned  Airway Management Planned: Oral ETT  Additional Equipment:   Intra-op Plan:   Post-operative Plan: Extubation in OR  Informed Consent: I have reviewed the patients History and Physical, chart, labs and discussed the procedure including the risks, benefits and alternatives for the proposed anesthesia with the patient or authorized representative who has indicated his/her understanding and acceptance.   Dental advisory given  Plan Discussed with: CRNA, Anesthesiologist and Surgeon  Anesthesia Plan Comments:         Anesthesia Quick Evaluation

## 2013-09-08 ENCOUNTER — Inpatient Hospital Stay (HOSPITAL_COMMUNITY): Payer: BC Managed Care – PPO

## 2013-09-08 LAB — CBC
HEMATOCRIT: 40.1 % (ref 36.0–46.0)
Hemoglobin: 13 g/dL (ref 12.0–15.0)
MCH: 27 pg (ref 26.0–34.0)
MCHC: 32.4 g/dL (ref 30.0–36.0)
MCV: 83.4 fL (ref 78.0–100.0)
Platelets: 238 10*3/uL (ref 150–400)
RBC: 4.81 MIL/uL (ref 3.87–5.11)
RDW: 15.3 % (ref 11.5–15.5)
WBC: 8 10*3/uL (ref 4.0–10.5)

## 2013-09-08 LAB — COMPREHENSIVE METABOLIC PANEL
ALK PHOS: 88 U/L (ref 39–117)
ALT: 108 U/L — ABNORMAL HIGH (ref 0–35)
AST: 76 U/L — ABNORMAL HIGH (ref 0–37)
Albumin: 3 g/dL — ABNORMAL LOW (ref 3.5–5.2)
BILIRUBIN TOTAL: 0.5 mg/dL (ref 0.3–1.2)
CO2: 25 meq/L (ref 19–32)
CREATININE: 0.72 mg/dL (ref 0.50–1.10)
Calcium: 8.6 mg/dL (ref 8.4–10.5)
Chloride: 101 mEq/L (ref 96–112)
Glucose, Bld: 168 mg/dL — ABNORMAL HIGH (ref 70–99)
Potassium: 4.2 mEq/L (ref 3.7–5.3)
Sodium: 138 mEq/L (ref 137–147)
Total Protein: 6.4 g/dL (ref 6.0–8.3)

## 2013-09-08 MED ORDER — KETOROLAC TROMETHAMINE 15 MG/ML IJ SOLN
15.0000 mg | Freq: Four times a day (QID) | INTRAMUSCULAR | Status: DC
  Administered 2013-09-08 – 2013-09-10 (×8): 15 mg via INTRAVENOUS
  Filled 2013-09-08 (×12): qty 1

## 2013-09-08 MED ORDER — DIPHENHYDRAMINE HCL 50 MG/ML IJ SOLN
25.0000 mg | Freq: Four times a day (QID) | INTRAMUSCULAR | Status: DC | PRN
  Administered 2013-09-08 – 2013-09-09 (×3): 25 mg via INTRAVENOUS
  Filled 2013-09-08 (×3): qty 1

## 2013-09-08 MED ORDER — ACETAMINOPHEN 500 MG PO TABS
1000.0000 mg | ORAL_TABLET | Freq: Four times a day (QID) | ORAL | Status: DC
  Administered 2013-09-08 (×2): 1000 mg via ORAL
  Filled 2013-09-08 (×10): qty 2

## 2013-09-08 MED ORDER — POLYETHYLENE GLYCOL 3350 17 G PO PACK
17.0000 g | PACK | Freq: Every day | ORAL | Status: DC
  Administered 2013-09-08: 17 g via ORAL
  Filled 2013-09-08 (×3): qty 1

## 2013-09-08 NOTE — Progress Notes (Signed)
Patient ID: Robyn Bryant, female   DOB: [birth year], 38 y.o.   MRN: [id number] [facility] Progress Note:   1 Day Post-Op  Subjective: Mental status is clear Objective: Vital signs in last 24 hours: Temp:  [97.4 F (36.3 C)-98.2 F (36.8 C)] 97.9 F (36.6 C) (01/31 0516) Pulse Rate:  [60-84] 63 (01/31 0516) Resp:  [16-20] 16 (01/31 0516) BP: (87-124)/(58-82) 102/61 mmHg (01/31 0516) SpO2:  [92 %-100 %] 92 % (01/31 0516)  Intake/Output from previous day: 01/30 0701 - 01/31 0700 In: 3200 [I.V.:3200] Out: 3900 [Urine:3900] Intake/Output this shift:    Physical Exam: Work of breathing is normal.  Incisions OK.  Patient was holding off on drinking because she was told that she would have another endoscopy today.  Not so.    Lab Results:  Results for orders placed during the hospital encounter of 09/02/13 (from the past 48 hour(s))  SURGICAL PCR SCREEN     Status: None   Collection Time    09/07/13 12:00 PM      Result Value Range   MRSA, PCR NEGATIVE  NEGATIVE   Staphylococcus aureus NEGATIVE  NEGATIVE   Comment:            The Xpert SA Assay (FDA     approved for NASAL specimens     in patients over 41 years of age),     is one component of     a comprehensive surveillance     program.  Test performance has     been validated by [employer] for patients greater     than or equal to 68 year old.     It is not intended     to diagnose infection nor to     guide or monitor treatment.  CBC     Status: None   Collection Time    09/08/13  4:40 AM      Result Value Range   WBC 8.0  4.0 - 10.5 K/uL   RBC 4.81  3.87 - 5.11 MIL/uL   Hemoglobin 13.0  12.0 - 15.0 g/dL   HCT 40.1  36.0 - 46.0 %   MCV 83.4  78.0 - 100.0 fL   MCH 27.0  26.0 - 34.0 pg   MCHC 32.4  30.0 - 36.0 g/dL   RDW 15.3  11.5 - 15.5 %   Platelets 238  150 - 400 K/uL  COMPREHENSIVE METABOLIC PANEL     Status: Abnormal   Collection Time    09/08/13  4:40 AM      Result Value Range   Sodium 138  137 - 147 mEq/L   Potassium 4.2  3.7 - 5.3 mEq/L   Chloride 101  96 - 112 mEq/L   CO2 25  19 - 32 mEq/L   Glucose, Bld 168 (*) 70 - 99 mg/dL   BUN <3 (*) 6 - 23 mg/dL   Creatinine, Ser 0.72  0.50 - 1.10 mg/dL   Calcium 8.6  8.4 - 10.5 mg/dL   Total Protein 6.4  6.0 - 8.3 g/dL   Albumin 3.0 (*) 3.5 - 5.2 g/dL   AST 76 (*) 0 - 37 U/L   ALT 108 (*) 0 - 35 U/L   Alkaline Phosphatase 88  39 - 117 U/L   Total Bilirubin 0.5  0.3 - 1.2 mg/dL   GFR calc non Af Amer >90  >90 mL/min   GFR calc Af Amer >90  >90 mL/min  Comment: (NOTE)     The eGFR has been calculated using the CKD EPI equation.     This calculation has not been validated in all clinical situations.     eGFR's persistently <90 mL/min signify possible Chronic Kidney     Disease.    Radiology/Results: Dg Cholangiogram Operative  09/08/2013   CLINICAL DATA:  Cholelithiasis.  EXAM: INTRAOPERATIVE CHOLANGIOGRAM  TECHNIQUE: Cholangiographic images from the C-arm fluoroscopic device were submitted for interpretation post-operatively. Please see the procedural report for the amount of contrast and the fluoroscopy time utilized.  COMPARISON:  None.  FINDINGS: 9 seconds fluoroscopy used.  Fluoroscopic cine clips shows cholangiography through the cystic duct. The common bile duct is non dilated, and there is free flow into the duodenum. No stone or other filling defect seen. There is a lucency in the right hepatic duct which appears mobile, most likely a small air bubble. The intrahepatic biliary tree is nondilated.  IMPRESSION: Negative cholangiogram, as above.   Electronically Signed   By: Jorje Guild M.D.   On: 09/08/2013 02:01    Anti-infectives: Anti-infectives   Start     Dose/Rate Route Frequency Ordered Stop   09/07/13 1150  ceFAZolin (ANCEF) IVPB 2 g/50 mL premix     2 g 100 mL/hr over 30 Minutes Intravenous 30 min pre-op 09/07/13 1146 09/07/13 2020      Assessment/Plan: Problem List: Patient Active Problem  List   Diagnosis Date Noted  . Abdominal pain 09/02/2013  . De Quervain's tenosynovitis, right 07/04/2013  . Coccydynia 06/07/2013  . Obesity, Class III, BMI 40-49.9 (morbid obesity) s/p lap gleeve gastrectomy 05/10/2013  . Tendonitis of wrist, right 04/25/2013  . Other abnormal glucose 03/28/2013  . Hand eczema 03/28/2013  . Plantar fasciitis, bilateral 03/28/2013  . Routine general medical examination at a health care facility 07/14/2011  . Obesity 07/14/2011    Begin clear liquids PO.   1 Day Post-Op    LOS: 6 days   Matt B. Hassell Done, MD, [facility] [phone] beeper [phone]  09/08/2013 8:21 AM

## 2013-09-09 NOTE — Progress Notes (Signed)
Patient ID: Robyn Bryant, female   DOB: 1976-02-17, 38 y.o.   MRN: [id number] [facility] Progress Note:   2 Days Post-Op  Subjective: Mental status is clear.  Some nausea but lacks confidence.  Wants to take solids before going home.   Objective: Vital signs in last 24 hours: Temp:  [97.6 F (36.4 C)-98.1 F (36.7 C)] 97.6 F (36.4 C) (02/01 0549) Pulse Rate:  [56-68] 56 (02/01 0549) Resp:  [16-18] 18 (02/01 0549) BP: (103-109)/(65-68) 103/68 mmHg (02/01 0549) SpO2:  [93 %-99 %] 96 % (02/01 0549)  Intake/Output from previous day: 01/31 0701 - 02/01 0700 In: 3720 [P.O.:1320; I.V.:2400] Out: 3500 [Urine:3500] Intake/Output this shift:    Physical Exam: Work of breathing is normal.  Pain minimal  Lab Results:  Results for orders placed during the hospital encounter of 09/02/13 (from the past 48 hour(s))  SURGICAL PCR SCREEN     Status: None   Collection Time    09/07/13 12:00 PM      Result Value Range   MRSA, PCR NEGATIVE  NEGATIVE   Staphylococcus aureus NEGATIVE  NEGATIVE   Comment:            The Xpert SA Assay (FDA     approved for NASAL specimens     in patients over 86 years of age),     is one component of     a comprehensive surveillance     program.  Test performance has     been validated by [employer] for patients greater     than or equal to 76 year old.     It is not intended     to diagnose infection nor to     guide or monitor treatment.  CBC     Status: None   Collection Time    09/08/13  4:40 AM      Result Value Range   WBC 8.0  4.0 - 10.5 K/uL   RBC 4.81  3.87 - 5.11 MIL/uL   Hemoglobin 13.0  12.0 - 15.0 g/dL   HCT 40.1  36.0 - 46.0 %   MCV 83.4  78.0 - 100.0 fL   MCH 27.0  26.0 - 34.0 pg   MCHC 32.4  30.0 - 36.0 g/dL   RDW 15.3  11.5 - 15.5 %   Platelets 238  150 - 400 K/uL  COMPREHENSIVE METABOLIC PANEL     Status: Abnormal   Collection Time    09/08/13  4:40 AM      Result Value Range   Sodium 138  137 - 147 mEq/L    Potassium 4.2  3.7 - 5.3 mEq/L   Chloride 101  96 - 112 mEq/L   CO2 25  19 - 32 mEq/L   Glucose, Bld 168 (*) 70 - 99 mg/dL   BUN <3 (*) 6 - 23 mg/dL   Creatinine, Ser 0.72  0.50 - 1.10 mg/dL   Calcium 8.6  8.4 - 10.5 mg/dL   Total Protein 6.4  6.0 - 8.3 g/dL   Albumin 3.0 (*) 3.5 - 5.2 g/dL   AST 76 (*) 0 - 37 U/L   ALT 108 (*) 0 - 35 U/L   Alkaline Phosphatase 88  39 - 117 U/L   Total Bilirubin 0.5  0.3 - 1.2 mg/dL   GFR calc non Af Amer >90  >90 mL/min   GFR calc Af Amer >90  >90 mL/min   Comment: (NOTE)  The eGFR has been calculated using the CKD EPI equation.     This calculation has not been validated in all clinical situations.     eGFR's persistently <90 mL/min signify possible Chronic Kidney     Disease.    Radiology/Results: Dg Cholangiogram Operative  09/08/2013   CLINICAL DATA:  Cholelithiasis.  EXAM: INTRAOPERATIVE CHOLANGIOGRAM  TECHNIQUE: Cholangiographic images from the C-arm fluoroscopic device were submitted for interpretation post-operatively. Please see the procedural report for the amount of contrast and the fluoroscopy time utilized.  COMPARISON:  None.  FINDINGS: 9 seconds fluoroscopy used.  Fluoroscopic cine clips shows cholangiography through the cystic duct. The common bile duct is non dilated, and there is free flow into the duodenum. No stone or other filling defect seen. There is a lucency in the right hepatic duct which appears mobile, most likely a small air bubble. The intrahepatic biliary tree is nondilated.  IMPRESSION: Negative cholangiogram, as above.   Electronically Signed   By: Jorje Guild M.D.   On: 09/08/2013 02:01    Anti-infectives: Anti-infectives   Start     Dose/Rate Route Frequency Ordered Stop   09/07/13 1150  ceFAZolin (ANCEF) IVPB 2 g/50 mL premix     2 g 100 mL/hr over 30 Minutes Intravenous 30 min pre-op 09/07/13 1146 09/07/13 2020      Assessment/Plan: Problem List: Patient Active Problem List   Diagnosis Date Noted   . Abdominal pain 09/02/2013  . De Quervain's tenosynovitis, right 07/04/2013  . Coccydynia 06/07/2013  . Obesity, Class III, BMI 40-49.9 (morbid obesity) s/p lap gleeve gastrectomy 05/10/2013  . Tendonitis of wrist, right 04/25/2013  . Other abnormal glucose 03/28/2013  . Hand eczema 03/28/2013  . Plantar fasciitis, bilateral 03/28/2013  . Routine general medical examination at a health care facility 07/14/2011  . Obesity 07/14/2011    Wants to wait and go home tomorrow and try solid diet before discharge.   2 Days Post-Op    LOS: 7 days   Matt B. Hassell Done, MD, [facility] [phone] beeper [phone]  09/09/2013 7:52 AM

## 2013-09-10 ENCOUNTER — Encounter (HOSPITAL_COMMUNITY): Payer: Self-pay | Admitting: General Surgery

## 2013-09-10 MED ORDER — PANTOPRAZOLE SODIUM 40 MG PO TBEC: 40.0000 mg | DELAYED_RELEASE_TABLET | Freq: Every day | ORAL | Status: DC

## 2013-09-10 MED ORDER — SUCRALFATE 1 GM/10ML PO SUSP: 1.0000 g | Freq: Three times a day (TID) | ORAL | Status: DC

## 2013-09-10 MED ORDER — OXYCODONE HCL 5 MG PO TABS: 5.0000 mg | ORAL_TABLET | ORAL | Status: DC | PRN

## 2013-09-10 NOTE — Progress Notes (Signed)
Patient ID: Frances FurbishJessica Nogueras, female   DOB: 07/19/1976, 38 y.o.   MRN: [id number] 3 Days Post-Op  Subjective: Feels better today. She feels that her preoperative pain has definitely been relieved by cholecystectomy. She is a little gassy and sore but feeling better daily. Tolerated solid food for breakfast today.  Objective: Vital signs in last 24 hours: Temp:  [97.7 F (36.5 C)-98.2 F (36.8 C)] 97.7 F (36.5 C) (02/02 0501) Pulse Rate:  [50-66] 50 (02/02 0501) Resp:  [18] 18 (02/02 0501) BP: (107-119)/(63-83) 119/83 mmHg (02/02 0501) SpO2:  [98 %-100 %] 98 % (02/02 0501) Last BM Date: 09/09/13  Intake/Output from previous day: 02/01 0701 - 02/02 0700 In: 2486.7 [P.O.:480; I.V.:2006.7] Out: 1375 [Urine:1375] Intake/Output this shift:    General appearance: alert, cooperative and no distress GI: normal findings: soft, non-tender Incision/Wound: clean and dry without evidence of infection  Lab Results:   Recent Labs  09/08/13 0440  WBC 8.0  HGB 13.0  HCT 40.1  PLT 238   BMET  Recent Labs  09/08/13 0440  NA 138  K 4.2  CL 101  CO2 25  GLUCOSE 168*  BUN <3*  CREATININE 0.72  CALCIUM 8.6     Studies/Results: No results found.  Anti-infectives: Anti-infectives   Start     Dose/Rate Route Frequency Ordered Stop   09/07/13 1150  ceFAZolin (ANCEF) IVPB 2 g/50 mL premix     2 g 100 mL/hr over 30 Minutes Intravenous 30 min pre-op 09/07/13 1146 09/07/13 2020      Assessment/Plan: s/p Procedure(s): LAPAROSCOPIC CHOLECYSTECTOMY WITH INTRAOPERATIVE CHOLANGIOGRAM Doing well postoperatively. No apparent complication. Okay for discharge.   LOS: 8 days    Lakina Mcintire T 09/10/2013

## 2013-09-10 NOTE — Discharge Instructions (Signed)
[facility] ______[facility] °LAPAROSCOPIC SURGERY: POST OP INSTRUCTIONS °Always review your discharge instruction sheet given to you by the facility where your surgery was performed. °IF YOU HAVE DISABILITY OR FAMILY LEAVE FORMS, YOU MUST BRING THEM TO THE OFFICE FOR PROCESSING.   °DO NOT GIVE THEM TO YOUR DOCTOR. ° °1. A prescription for pain medication may be given to you upon discharge.  Take your pain medication as prescribed, if needed.  If narcotic pain medicine is not needed, then you may take acetaminophen (Tylenol) or ibuprofen (Advil) as needed. °2. Take your usually prescribed medications unless otherwise directed. °3. If you need a refill on your pain medication, please contact your pharmacy.  They will contact our office to request authorization. Prescriptions will not be filled after 5pm or on week-ends. °4. You should follow a light diet the first few days after arrival home, such as soup and crackers, etc.  Be sure to include lots of fluids daily. °5. Most patients will experience some swelling and bruising in the area of the incisions.  Ice packs will help.  Swelling and bruising can take several days to resolve.  °6. It is common to experience some constipation if taking pain medication after surgery.  Increasing fluid intake and taking a stool softener (such as Colace) will usually help or prevent this problem from occurring.  A mild laxative (Milk of Magnesia or Miralax) should be taken according to package instructions if there are no bowel movements after 48 hours. °7. Unless discharge instructions indicate otherwise, you may remove your bandages 24-48 hours after surgery, and you may shower at that time.  You may have steri-strips (small skin tapes) in place directly over the incision.  These strips should be left on the skin for 7-10 days.  If your surgeon used skin glue on the incision, you may shower in 24 hours.  The glue will flake off over the next 2-3 weeks.  Any sutures or  staples will be removed at the office during your follow-up visit. °8. ACTIVITIES:  You may resume regular (light) daily activities beginning the next day--such as daily self-care, walking, climbing stairs--gradually increasing activities as tolerated.  You may have sexual intercourse when it is comfortable.  Refrain from any heavy lifting or straining until approved by your doctor. °a. You may drive when you are no longer taking prescription pain medication, you can comfortably wear a seatbelt, and you can safely maneuver your car and apply brakes. °b. RETURN TO WORK:  __________________________________________________________ °9. You should see your doctor in the office for a follow-up appointment approximately 2-3 weeks after your surgery.  Make sure that you call for this appointment within a day or two after you arrive home to insure a convenient appointment time. °10. OTHER INSTRUCTIONS: __________________________________________________________________________________________________________________________ __________________________________________________________________________________________________________________________ °WHEN TO CALL YOUR DOCTOR: °1. Fever over 101.0 °2. Inability to urinate °3. Continued bleeding from incision. °4. Increased pain, redness, or drainage from the incision. °5. Increasing abdominal pain ° °The clinic staff is available to answer your questions during regular business hours.  Please don’t hesitate to call and ask to speak to one of the nurses for clinical concerns.  If you have a medical emergency, go to the nearest emergency room or call 911.  A surgeon from [facility] is always on call at the hospital. °[address], [address], [place], [place]  [address] ? [address] [address], [place],    [address] °[phone] ? [phone] ? FAX [phone] °Web site:   [email or web address]   No work or strenuous activity for approximately 10 days,  up to approximately February 16.

## 2013-09-10 NOTE — Discharge Summary (Signed)
   Patient ID: Robyn Bryant [id number] 38 y.o. 10/15/1975  09/02/2013  Discharge date and time: 09/10/2013   Admitting Physician: Glenna FellowsHOXWORTH,Uel Davidow T  Discharge Physician: Glenna FellowsHOXWORTH,Mylissa Lambe T  Admission Diagnoses: post-op problems/pain   Discharge Diagnoses: same, cholecystitis  Operations: Procedure(s): LAPAROSCOPIC CHOLECYSTECTOMY WITH INTRAOPERATIVE CHOLANGIOGRAM  Admission Condition: fair  Discharged Condition: good  Indication for Admission: patient is a 38 year old female proximally one month following laparoscopic sleeve gastrectomy for morbid obesity. She was doing very well for the first several weeks but presented this admission with a fairly acute onset of epigastric substernal and right upper quadrant abdominal pain associated with nausea and vomiting. She was admitted for evaluation and treatment.  Hospital Course: initial workup included labwork and CT scan. This showed minimally elevated lipase and transaminases. CT scan of the abdomen and pelvis was unremarkable with a normal appearing sleeve and no evidence of leak or obstruction. The patient was admitted and treated symptomatically but continued to have pain. Gallbladder ultrasound was obtained showing sludge. HIDA was obtained which was normal with prompt filling of the gallbladder. The patient had some improvement of her substernal burning with Protonix and Carafate but continued to have right upper quadrant pain. Upper endoscopy was performed which showed some possible spasm of the sleeve but no inflammation or ulcer or obstruction. At this point the patient was continuing to have significant right upper quadrant abdominal pain we are unable to advance her diet. I recommended laparoscopy with cholecystectomy for possible cholecystitis as she did have persistent right upper quadrant pain and tenderness and sludge her gallbladder ultrasound and mildly elevated transaminases and lipase on admission. The patient was taken  to the operating room on 09/07/2013 and a laparoscopy or sleeve was carefully examined and appeared completely normal. Her gallbladder did appear somewhat thickened and edematous and there was a dense band of scar tissue across the infundibulum as well. The patient underwent cholecystectomy with normal operative cholangiogram. She experienced fairly prompt relief of her right upper quadrant abdominal pain. She still had some nausea or couple of days and gradually improved. On the date of discharge she is complaining only of some typical incisional soreness and has been able to tolerate solid food although she fills up very quickly. She feels definitely her pain has been relieved.  Consults: Dr. Ovidio Kinavid Newman for upper GI endoscopy  Significant Diagnostic Studies: gallbladder ultrasound, HIDA and CT scan of the abdomen and pelvis  Treatments: surgery: laparoscopic cholecystectomy with cholangiogram  Disposition: Home  Patient Instructions:    Medication List         oxyCODONE 5 MG immediate release tablet  Commonly known as:  Oxy IR/ROXICODONE  Take 1 tablet (5 mg total) by mouth every 4 (four) hours as needed for moderate pain.     pantoprazole 40 MG tablet  Commonly known as:  PROTONIX  Take 1 tablet (40 mg total) by mouth daily.     sucralfate 1 GM/10ML suspension  Commonly known as:  CARAFATE  Take 10 mLs (1 g total) by mouth 4 (four) times daily -  with meals and at bedtime.        Activity: activity as tolerated Diet: post sleeve gastrectomy diet Wound Care: none needed  Follow-up:  With Dr. Johna SheriffHoxworth in 10 days.  Signed: Mariella SaaBenjamin T Tkeyah Burkman MD, FACS  09/10/2013, 9:43 AM

## 2013-09-10 NOTE — Care Management Note (Signed)
    Page 1 of 1   09/10/2013     11:00:33 AM   CARE MANAGEMENT NOTE 09/10/2013  Patient:  Robyn FurbishOGUERAS,Shaquan   Account Number:  [id number]  Date Initiated:  09/07/2013  Documentation initiated by:  Lorenda IshiharaPEELE,Leilanie Rauda  Subjective/Objective Assessment:   38 yo female admitted with abd pain, hx recent gastric sleeve. PTA lived at home with family     Action/Plan:   Home when stable   Anticipated DC Date:  09/10/2013   Anticipated DC Plan:  HOME/SELF CARE      DC Planning Services  CM consult      Choice offered to / List presented to:             Status of service:  Completed, signed off Medicare Important Message given?   (If response is "NO", the following Medicare IM given date fields will be blank) Date Medicare IM given:   Date Additional Medicare IM given:    Discharge Disposition:  HOME/SELF CARE  Per UR Regulation:  Reviewed for med. necessity/level of care/duration of stay  If discussed at Long Length of Stay Meetings, dates discussed:    Comments:

## 2013-09-12 ENCOUNTER — Telehealth (INDEPENDENT_AMBULATORY_CARE_PROVIDER_SITE_OTHER): Payer: Self-pay | Admitting: *Deleted

## 2013-09-12 ENCOUNTER — Telehealth (INDEPENDENT_AMBULATORY_CARE_PROVIDER_SITE_OTHER): Payer: Self-pay | Admitting: General Surgery

## 2013-09-12 NOTE — Telephone Encounter (Signed)
Pt called in stating that she was discharged from the hospital on Monday 09/10/13 s/p lap chole and hasn't been able to get her pain under control and is unable to keep any food or liquids down.  She states she has been taking her prescribed Oxycodone Q4h as well as the 12.5 mg phenergan that was prescribed for her.  I suggested that she continue taking the Oxycodone Q4h as well as adding 800 mg ibuprofen Q8h.  She states that she is unable to swallow ibuprofen pills.  Also, I informed her that we could call in phenergan suppository since she is unable to keep anything down.  She states that she is already uncomfortable enough and doesn't want to do a suppository.  I informed her that I would send a message to Dr. Johna SheriffHoxworth to see if he has any further recommendations.  She is agreeable at this time.

## 2013-09-12 NOTE — Telephone Encounter (Signed)
Spoke with the patient. She is having pain that seems to be incisional and related to movement. Also nausea and not tolerating solid foods. She is able to keep liquids down. I advised her not to take ibuprofen or any NSAIDS. She will use Tylenol and oxycodone as needed and Phenergan as needed. I asked her to contact me if she is feeling any worse or if she is not feeling any better by Friday (2 days) I asked her to call me in the morning so I could get her into the office to check her.

## 2013-09-15 ENCOUNTER — Telehealth (INDEPENDENT_AMBULATORY_CARE_PROVIDER_SITE_OTHER): Payer: Self-pay | Admitting: Surgery

## 2013-09-15 NOTE — Telephone Encounter (Signed)
Ms. Robyn Bryant had a lap sleeve 08/08/2013 and lap chole 09/07/2013 by Dr. Johna SheriffHoxworth.  She was doing fairly good until this afternoon when she started feeling weak and shaking.  She has no fever and is not nauseated.  She is having no abdominal pain.  I told her options are to come to the ER or stay on liquids and see how she does overnight.  She is going to see how she does overnight.  She knows to call us if she is no better in the morning.  D. [employer] Healthcare[facility]

## 2013-09-17 ENCOUNTER — Telehealth (INDEPENDENT_AMBULATORY_CARE_PROVIDER_SITE_OTHER): Payer: Self-pay

## 2013-09-17 ENCOUNTER — Emergency Department (HOSPITAL_COMMUNITY): Payer: BC Managed Care – PPO

## 2013-09-17 ENCOUNTER — Encounter (HOSPITAL_COMMUNITY): Payer: Self-pay | Admitting: Emergency Medicine

## 2013-09-17 ENCOUNTER — Emergency Department (HOSPITAL_COMMUNITY)
Admission: EM | Admit: 2013-09-17 | Discharge: 2013-09-18 | Disposition: A | Discharge status: Home / Self Care | Payer: BC Managed Care – PPO | Attending: Emergency Medicine | Admitting: Emergency Medicine

## 2013-09-17 ENCOUNTER — Telehealth (INDEPENDENT_AMBULATORY_CARE_PROVIDER_SITE_OTHER): Payer: Self-pay | Admitting: *Deleted

## 2013-09-17 DIAGNOSIS — Z9049 Acquired absence of other specified parts of digestive tract: Secondary | ICD-10-CM

## 2013-09-17 DIAGNOSIS — R34 Anuria and oliguria: Secondary | ICD-10-CM | POA: Insufficient documentation

## 2013-09-17 DIAGNOSIS — Z91040 Latex allergy status: Secondary | ICD-10-CM | POA: Insufficient documentation

## 2013-09-17 DIAGNOSIS — Z9089 Acquired absence of other organs: Secondary | ICD-10-CM | POA: Insufficient documentation

## 2013-09-17 DIAGNOSIS — Z3202 Encounter for pregnancy test, result negative: Secondary | ICD-10-CM | POA: Insufficient documentation

## 2013-09-17 DIAGNOSIS — Z79899 Other long term (current) drug therapy: Secondary | ICD-10-CM | POA: Insufficient documentation

## 2013-09-17 DIAGNOSIS — J45901 Unspecified asthma with (acute) exacerbation: Secondary | ICD-10-CM | POA: Insufficient documentation

## 2013-09-17 DIAGNOSIS — R1310 Dysphagia, unspecified: Secondary | ICD-10-CM | POA: Insufficient documentation

## 2013-09-17 DIAGNOSIS — R110 Nausea: Secondary | ICD-10-CM | POA: Insufficient documentation

## 2013-09-17 DIAGNOSIS — R1013 Epigastric pain: Secondary | ICD-10-CM | POA: Insufficient documentation

## 2013-09-17 DIAGNOSIS — R109 Unspecified abdominal pain: Secondary | ICD-10-CM

## 2013-09-17 LAB — CBC WITH DIFFERENTIAL/PLATELET
BASOS ABS: 0.1 10*3/uL (ref 0.0–0.1)
BASOS PCT: 1 % (ref 0–1)
Eosinophils Absolute: 0.3 10*3/uL (ref 0.0–0.7)
Eosinophils Relative: 2 % (ref 0–5)
HEMATOCRIT: 43 % (ref 36.0–46.0)
Hemoglobin: 14.4 g/dL (ref 12.0–15.0)
Lymphocytes Relative: 26 % (ref 12–46)
Lymphs Abs: 2.9 10*3/uL (ref 0.7–4.0)
MCH: 27.4 pg (ref 26.0–34.0)
MCHC: 33.5 g/dL (ref 30.0–36.0)
MCV: 81.7 fL (ref 78.0–100.0)
Monocytes Absolute: 0.9 10*3/uL (ref 0.1–1.0)
Monocytes Relative: 8 % (ref 3–12)
NEUTROS ABS: 7.1 10*3/uL (ref 1.7–7.7)
Neutrophils Relative %: 63 % (ref 43–77)
PLATELETS: 346 10*3/uL (ref 150–400)
RBC: 5.26 MIL/uL — ABNORMAL HIGH (ref 3.87–5.11)
RDW: 15.3 % (ref 11.5–15.5)
WBC: 11.3 10*3/uL — AB (ref 4.0–10.5)

## 2013-09-17 LAB — URINALYSIS, ROUTINE W REFLEX MICROSCOPIC
GLUCOSE, UA: NEGATIVE mg/dL
Hgb urine dipstick: NEGATIVE
NITRITE: NEGATIVE
PROTEIN: 30 mg/dL — AB
Specific Gravity, Urine: 1.04 — ABNORMAL HIGH (ref 1.005–1.030)
UROBILINOGEN UA: 1 mg/dL (ref 0.0–1.0)
pH: 6 (ref 5.0–8.0)

## 2013-09-17 LAB — D-DIMER, QUANTITATIVE: D-Dimer, Quant: 0.84 ug/mL-FEU — ABNORMAL HIGH (ref 0.00–0.48)

## 2013-09-17 LAB — URINE MICROSCOPIC-ADD ON

## 2013-09-17 LAB — COMPREHENSIVE METABOLIC PANEL
ALT: 73 U/L — AB (ref 0–35)
AST: 27 U/L (ref 0–37)
Albumin: 3.7 g/dL (ref 3.5–5.2)
Alkaline Phosphatase: 88 U/L (ref 39–117)
BILIRUBIN TOTAL: 0.9 mg/dL (ref 0.3–1.2)
BUN: 12 mg/dL (ref 6–23)
CHLORIDE: 99 meq/L (ref 96–112)
CO2: 22 mEq/L (ref 19–32)
CREATININE: 0.7 mg/dL (ref 0.50–1.10)
Calcium: 9.3 mg/dL (ref 8.4–10.5)
GFR calc Af Amer: >90 mL/min (ref >90)
GFR calc non Af Amer: >90 mL/min (ref >90)
Glucose, Bld: 100 mg/dL — ABNORMAL HIGH (ref 70–99)
Potassium: 3.7 mEq/L (ref 3.7–5.3)
SODIUM: 140 meq/L (ref 137–147)
Total Protein: 7.7 g/dL (ref 6.0–8.3)

## 2013-09-17 LAB — POCT PREGNANCY, URINE: PREG TEST UR: NEGATIVE

## 2013-09-17 LAB — TROPONIN I: Troponin I: <0.3 ng/mL (ref <0.30)

## 2013-09-17 LAB — LIPASE, BLOOD: Lipase: 102 U/L — ABNORMAL HIGH (ref 11–59)

## 2013-09-17 MED ORDER — HYDROMORPHONE HCL PF 1 MG/ML IJ SOLN
0.5000 mg | Freq: Once | INTRAMUSCULAR | Status: AC
  Administered 2013-09-17: 0.5 mg via INTRAVENOUS
  Filled 2013-09-17: qty 1

## 2013-09-17 MED ORDER — ONDANSETRON 8 MG PO TBDP
8.0000 mg | ORAL_TABLET | Freq: Once | ORAL | Status: AC
  Administered 2013-09-17: 8 mg via ORAL
  Filled 2013-09-17: qty 1

## 2013-09-17 MED ORDER — SODIUM CHLORIDE 0.9 % IV BOLUS (SEPSIS)
1000.0000 mL | Freq: Once | INTRAVENOUS | Status: AC
  Administered 2013-09-18: 1000 mL via INTRAVENOUS

## 2013-09-17 MED ORDER — IOHEXOL 300 MG/ML  SOLN
100.0000 mL | Freq: Once | INTRAMUSCULAR | Status: AC | PRN
  Administered 2013-09-17: 100 mL via INTRAVENOUS

## 2013-09-17 MED ORDER — IOHEXOL 300 MG/ML  SOLN
50.0000 mL | Freq: Once | INTRAMUSCULAR | Status: AC | PRN
  Administered 2013-09-17: 50 mL via ORAL

## 2013-09-17 MED ORDER — DIPHENHYDRAMINE HCL 50 MG/ML IJ SOLN
25.0000 mg | Freq: Once | INTRAMUSCULAR | Status: AC
  Administered 2013-09-17: 25 mg via INTRAVENOUS
  Filled 2013-09-17: qty 1

## 2013-09-17 MED ORDER — SODIUM CHLORIDE 0.9 % IV BOLUS (SEPSIS)
1000.0000 mL | Freq: Once | INTRAVENOUS | Status: AC
  Administered 2013-09-17: 1000 mL via INTRAVENOUS

## 2013-09-17 NOTE — Telephone Encounter (Signed)
Patient called back and states that she can't take anything in.  Patient states she has only drank 1 water bottle all day.  Patient states she is still having issues with shaking.  I explained that if she believes things are getting that bad then she need to probably go to the ED for an evaluation.  Patient is agreeable with this and states that she just can't take it anymore.

## 2013-09-17 NOTE — ED Provider Notes (Signed)
CSN: [id number]     Arrival date & time 09/17/13  1634 History   First MD Initiated Contact with Patient 09/17/13 1934     Chief Complaint  Patient presents with  . Emesis     (Consider location/radiation/quality/duration/timing/severity/associated sxs/prior Treatment) The history is provided by the patient. No language interpreter was used.  Robyn Bryant is a 38 year old female with past medical history of asthma, gastric sleeve resection performed on 08/08/2013 and a cholecystectomy performed on 09/07/2013 by Dr. Dillard Essex presenting to emergency department with abdominal pain, chest pain, shortness of breath, difficulty swallowing and decreased urine that started on Friday night. Patient reported that she has noticed shortness of breath with intermittent chest pain when she has to walk long distances. Stated that the chest pain is localized to the Center the chest reported that the sensation is as if "heart is going to come out"-tightness. Stated that the symptoms only occur when she is walking-chest pain with associated shortness of breath. Patient reported that she's been experiencing abdominal pain described more so the tenderness sensation localized to the incision sites where her laparoscopic surgery was performed. Stated that she has been having difficulty swallowing-reports that he feels as if the food and fluid gets stuck in the Center of her esophagus and starts to trickle down. Stated that she is hungry but is unable to eat. Stated that she spoke with Dr. Daphine Deutscher from [facility] [place] [facility] on Friday regarding the symptoms who recommended to strength protein shakes and stick with a fluid diet. Stated that started on Friday she is able to drink approximately 2 ounces of fluid and 4 ounces of protein shakes - reported any more than this her abdomen begins to bother her. Stated that she gave Dr. Johna Sheriff a call this afternoon-Dr. Johna Sheriff was in surgery all day, nurse recommended patient to  be reassessed in the emergency department. Reported that she's been having chills intermittently. Denied fever, cough, hemoptysis, diarrhea, swelling to the legs, hematochezia, melena. PCP Dr. Yetta Barre  Past Medical History  Diagnosis Date  . Asthma     as teenager, none now   Past Surgical History  Procedure Laterality Date  . Tubal ligation  2001  . Laparoscopic gastric sleeve resection N/A 08/08/2013    Procedure: LAPAROSCOPIC GASTRIC SLEEVE RESECTION UPPER ENDOSCOPY;  Surgeon: Mariella Saa, MD;  Location: [facility];  Service: General;  Laterality: N/A;  . Upper gi endoscopy  08/08/2013    Procedure: UPPER GI ENDOSCOPY;  Surgeon: Mariella Saa, MD;  Location: [facility];  Service: General;;  . Esophagogastroduodenoscopy N/A 09/06/2013    Procedure: ESOPHAGOGASTRODUODENOSCOPY (EGD);  Surgeon: Kandis Cocking, MD;  Location: [facility];  Service: General;  Laterality: N/A;  . Cholecystectomy N/A 09/07/2013    Procedure: LAPAROSCOPIC CHOLECYSTECTOMY WITH INTRAOPERATIVE CHOLANGIOGRAM;  Surgeon: Mariella Saa, MD;  Location: [facility];  Service: General;  Laterality: N/A;   Family History  Problem Relation Age of Onset  . Diabetes Mother   . Hypertension Mother   . Alcohol abuse Father   . Asthma Sister    History  Substance Use Topics  . Smoking status: Never Smoker   . Smokeless tobacco: Never Used  . Alcohol Use: No   OB History   Grav Para Term Preterm Abortions TAB SAB Ect Mult Living                 Review of Systems  Constitutional: Negative for fever and chills.  Respiratory: Positive for shortness of breath. Negative for  chest tightness.   Cardiovascular: Positive for chest pain.  Gastrointestinal: Positive for nausea and abdominal pain. Negative for vomiting, diarrhea, constipation, blood in stool and anal bleeding.  Genitourinary: Positive for decreased urine volume. Negative for dysuria, vaginal bleeding, vaginal discharge and vaginal pain.  Musculoskeletal:  Negative for back pain and neck pain.  Neurological: Negative for dizziness and weakness.  All other systems reviewed and are negative.      Allergies  Morphine and related; Latex; and Dilaudid  Home Medications   Current Outpatient Rx  Name  Route  Sig  Dispense  Refill  . Cyanocobalamin (NASCOBAL NA)   Nasal   Place 1 spray into the nose once a week.         . Multiple Vitamin (MULTIVITAMIN WITH MINERALS) TABS tablet   Oral   Take 1 tablet by mouth daily.         . promethazine (PHENERGAN) 25 MG tablet   Oral   Take 25 mg by mouth every 6 (six) hours as needed for nausea or vomiting.         . sucralfate (CARAFATE) 1 GM/10ML suspension   Oral   Take 10 mLs (1 g total) by mouth 4 (four) times daily -  with meals and at bedtime.   420 mL   0   . ondansetron (ZOFRAN) 4 MG tablet   Oral   Take 1 tablet (4 mg total) by mouth every 6 (six) hours.   12 tablet   0   . pantoprazole (PROTONIX) 40 MG tablet   Oral   Take 1 tablet (40 mg total) by mouth daily.   30 tablet   1    BP 110/61  Pulse 73  Temp(Src) 97.9 F (36.6 C) (Oral)  Resp 20  SpO2 95%  LMP 09/03/2013 Physical Exam  Nursing note and vitals reviewed. Constitutional: She is oriented to person, place, and time. She appears well-developed and well-nourished. No distress.  Patient appears comfortable, sitting upright in bed  HENT:  Head: Normocephalic and atraumatic.  Mouth/Throat: No oropharyngeal exudate.  Dry mucous membranes  Eyes: Conjunctivae and EOM are normal. Pupils are equal, round, and reactive to light. Right eye exhibits no discharge. Left eye exhibits no discharge.  Neck: Normal range of motion. Neck supple.  Cardiovascular: Normal rate, regular rhythm and normal heart sounds.   Pulses:      Radial pulses are 2+ on the right side, and 2+ on the left side.       Dorsalis pedis pulses are 2+ on the right side, and 2+ on the left side.  Pulmonary/Chest: Effort normal and breath  sounds normal. No respiratory distress. She has no wheezes. She has no rales.  Abdominal: Soft. Normal appearance and bowel sounds are normal. There is tenderness in the epigastric area and suprapubic area. There is no rebound and no guarding.    Obese Discomfort upon palpation to the epigastric region Discomfort upon palpation to suprapubic region  Musculoskeletal: Normal range of motion.  Full ROM to upper and lower extremities without difficulty noted, negative ataxia noted.  Neurological: She is alert and oriented to person, place, and time. She exhibits normal muscle tone. Coordination normal.  Cranial nerves III-XII grossly intact Strength 5+/5+ to upper and lower extremities bilaterally with resistance applied, equal distribution noted  Skin: Skin is warm and dry. No rash noted. She is not diaphoretic. No erythema.  Psychiatric: She has a normal mood and affect. Her behavior is normal. Thought content normal.  ED Course  Procedures (including critical care time)  Reviewed patient's chart. Patient had EGD performed on 09/02/2013 with mild spasms localized to the proximal one third of the abdomen. Negative findings noted, mild spasm of the proximal third of the stomach that occurred and then relaxed throughout remaining procedure.   Patient reported that she can take Dilaudid with Benadryl.   10:56 PM This provider spoke with CT regarding CT abdomen and pelvis with contrast being performed and the need for the CT angio to be performed to rule out PE. Recommended patient to be assessed for one hour and for fluids IV to be administered.   1:17 AM This provider re-assessed the patient. Patient reported that she is feeling better. Patient reported that she is feeling better. Discussed labs and imaging in great detail with patient.  1:44 AM Patient was able to tolerate fluids by mouth without difficulty.  2:45 AM This provider discussed plan for discharge with patient - patient agreed  to plan of care.   Results for orders placed during the hospital encounter of 09/17/13  URINALYSIS, ROUTINE W REFLEX MICROSCOPIC      Result Value Range   Color, Urine YELLOW  YELLOW   APPearance TURBID (*) CLEAR   Specific Gravity, Urine 1.040 (*) 1.005 - 1.030   pH 6.0  5.0 - 8.0   Glucose, UA NEGATIVE  NEGATIVE mg/dL   Hgb urine dipstick NEGATIVE  NEGATIVE   Bilirubin Urine MODERATE (*) NEGATIVE   Ketones, ur >80 (*) NEGATIVE mg/dL   Protein, ur 30 (*) NEGATIVE mg/dL   Urobilinogen, UA 1.0  0.0 - 1.0 mg/dL   Nitrite NEGATIVE  NEGATIVE   Leukocytes, UA TRACE (*) NEGATIVE  CBC WITH DIFFERENTIAL      Result Value Range   WBC 11.3 (*) 4.0 - 10.5 K/uL   RBC 5.26 (*) 3.87 - 5.11 MIL/uL   Hemoglobin 14.4  12.0 - 15.0 g/dL   HCT 13.0  86.5 - 78.4 %   MCV 81.7  78.0 - 100.0 fL   MCH 27.4  26.0 - 34.0 pg   MCHC 33.5  30.0 - 36.0 g/dL   RDW 69.6  29.5 - 28.4 %   Platelets 346  150 - 400 K/uL   Neutrophils Relative % 63  43 - 77 %   Neutro Abs 7.1  1.7 - 7.7 K/uL   Lymphocytes Relative 26  12 - 46 %   Lymphs Abs 2.9  0.7 - 4.0 K/uL   Monocytes Relative 8  3 - 12 %   Monocytes Absolute 0.9  0.1 - 1.0 K/uL   Eosinophils Relative 2  0 - 5 %   Eosinophils Absolute 0.3  0.0 - 0.7 K/uL   Basophils Relative 1  0 - 1 %   Basophils Absolute 0.1  0.0 - 0.1 K/uL  COMPREHENSIVE METABOLIC PANEL      Result Value Range   Sodium 140  137 - 147 mEq/L   Potassium 3.7  3.7 - 5.3 mEq/L   Chloride 99  96 - 112 mEq/L   CO2 22  19 - 32 mEq/L   Glucose, Bld 100 (*) 70 - 99 mg/dL   BUN 12  6 - 23 mg/dL   Creatinine, Ser 1.32  0.50 - 1.10 mg/dL   Calcium 9.3  8.4 - 44.0 mg/dL   Total Protein 7.7  6.0 - 8.3 g/dL   Albumin 3.7  3.5 - 5.2 g/dL   AST 27  0 - 37 U/L  ALT 73 (*) 0 - 35 U/L   Alkaline Phosphatase 88  39 - 117 U/L   Total Bilirubin 0.9  0.3 - 1.2 mg/dL   GFR calc non Af Amer >90  >90 mL/min   GFR calc Af Amer >90  >90 mL/min  LIPASE, BLOOD      Result Value Range   Lipase 102 (*) 11  - 59 U/L  TROPONIN I      Result Value Range   Troponin I <0.30  <0.30 ng/mL  D-DIMER, QUANTITATIVE      Result Value Range   D-Dimer, Quant 0.84 (*) 0.00 - 0.48 ug/mL-FEU  URINE MICROSCOPIC-ADD ON      Result Value Range   Squamous Epithelial / LPF MANY (*) RARE   WBC, UA 3-6  <3 WBC/hpf   RBC / HPF 0-2  <3 RBC/hpf   Bacteria, UA FEW (*) RARE   Crystals CA OXALATE CRYSTALS (*) NEGATIVE   Urine-Other MUCOUS PRESENT    TROPONIN I      Result Value Range   Troponin I <0.30  <0.30 ng/mL  POCT PREGNANCY, URINE      Result Value Range   Preg Test, Ur NEGATIVE  NEGATIVE    Ct Angio Chest Pe W/cm &/or Wo Cm  09/18/2013   CLINICAL DATA:  Shortness of breath with ambulation. History of gastric bypass surgery December 2014, cholecystectomy January 2015.  EXAM: CT ANGIOGRAPHY CHEST WITH CONTRAST  TECHNIQUE: Multidetector CT imaging of the chest was performed using the standard protocol during bolus administration of intravenous contrast. Multiplanar CT image reconstructions and MIPs were obtained to evaluate the vascular anatomy.  CONTRAST:  OMNIPAQUE IOHEXOL 350 MG/ML SOLN  COMPARISON:  Chest radiograph September 17, 2013.  FINDINGS: Moderately delayed bolus timing may limit sensitivity. No convincing evidence of pulmonary embolism to the subsegmental branches with the aforementioned limitation.  The heart and pericardium are unremarkable. Thoracic aorta appears normal.  No pleural effusions, focal consolidations. Trace dependent atelectasis. Tracheobronchial tree is patent, no pneumothorax. No lymphadenopathy by CT size criteria.  Included view of the abdomen is unremarkable. Soft tissues including the visualized thyroid gland are normal. Osseous structures are unremarkable.  Review of the MIP images confirms the above findings.  IMPRESSION: Suboptimal bolus timing without convincing evidence of pulmonary embolism nor acute cardiopulmonary process.   Electronically Signed   By: Awilda Metro    On: 09/18/2013 00:40    Ct Abdomen Pelvis W Contrast  09/17/2013   CLINICAL DATA:  Gastric sleeve December 2014. Cholecystectomy last week. Emesis.  EXAM: CT ABDOMEN AND PELVIS WITH CONTRAST  TECHNIQUE: Multidetector CT imaging of the abdomen and pelvis was performed using the standard protocol following bolus administration of intravenous contrast.  CONTRAST:  50mL OMNIPAQUE IOHEXOL 300 MG/ML SOLN, OMNIPAQUE IOHEXOL 300 MG/ML SOLN  COMPARISON:  DG ABD ACUTE W/CHEST dated 09/17/2013; CT ABD/PELVIS W CM dated 09/02/2013  FINDINGS: Lung Bases: Normal.  Liver:  Normal.  Spleen:  Normal.  Gallbladder: Surgically absent with clips in the fossa. There is a tiny amount of fluid in the gallbladder fossa, likely postoperative seroma or hematoma. Biloma considered less likely. No enhancement or gas to suggest abscess.  Common bile duct:  Normal.  Pancreas:  Normal.  Adrenal glands:  Normal bilaterally.  Kidneys:  Normal enhancement of both kidneys.  Stomach: Postoperative changes of gastric sleeve. There is no intra-abdominal free air. No evidence of obstruction of the stomach. The stomach appears empty. Gastroesophageal junction is normal.  Small bowel: Duodenum appears normal. Small bowel is within normal limits.  Colon:   Normal appendix.  The colon is normal, mostly decompressed.  Pelvic Genitourinary: Small amount of free fluid is present in the anatomic pelvis. This may be physiologic or postoperative. This measures low-attenuation and there is no peripheral enhancement.  Bones:  L5-S1 spondylosis.  No aggressive osseous lesions.  Vasculature: Normal.  Body Wall: Stranding is present in the periumbilical region compatible with recent laparoscopic cholecystectomy.  IMPRESSION: 1. Cholecystectomy with tiny amount of fluid in the gallbladder fossa, likely representing seroma. No gas or peripheral enhancement to suggest abscess. Tiny biloma could have this appearance as well. 2. Postoperative changes of gastric sleeve  without complication. 3. Stranding in the anterior abdominal wall is compatible with recent laparoscopic cholecystectomy. 4. No acute abnormality.   Electronically Signed   By: Andreas Newport M.D.   On: 09/17/2013 22:24    Dg Abd Acute W/chest  09/17/2013   CLINICAL DATA:  Abdomen pain  EXAM: ACUTE ABDOMEN SERIES (ABDOMEN 2 VIEW & CHEST 1 VIEW)  COMPARISON:  None.  FINDINGS: There is no evidence of dilated bowel loops or free intraperitoneal air. Bowel content is noted throughout colon. No radiopaque calculi or other significant radiographic abnormality is seen. Prior cholecystectomy clips are identified in the right upper quadrant. Heart size and mediastinal contours are within normal limits. Both lungs are clear.  IMPRESSION: No bowel obstruction or free air. Constipation. No acute cardiopulmonary disease.   Electronically Signed   By: Sherian Rein M.D.   On: 09/17/2013 21:58    Labs Review Labs Reviewed  URINALYSIS, ROUTINE W REFLEX MICROSCOPIC - Abnormal; Notable for the following:    APPearance TURBID (*)    Specific Gravity, Urine 1.040 (*)    Bilirubin Urine MODERATE (*)    Ketones, ur >80 (*)    Protein, ur 30 (*)    Leukocytes, UA TRACE (*)    All other components within normal limits  CBC WITH DIFFERENTIAL - Abnormal; Notable for the following:    WBC 11.3 (*)    RBC 5.26 (*)    All other components within normal limits  COMPREHENSIVE METABOLIC PANEL - Abnormal; Notable for the following:    Glucose, Bld 100 (*)    ALT 73 (*)    All other components within normal limits  LIPASE, BLOOD - Abnormal; Notable for the following:    Lipase 102 (*)    All other components within normal limits  D-DIMER, QUANTITATIVE - Abnormal; Notable for the following:    D-Dimer, Quant 0.84 (*)    All other components within normal limits  URINE MICROSCOPIC-ADD ON - Abnormal; Notable for the following:    Squamous Epithelial / LPF MANY (*)    Bacteria, UA FEW (*)    Crystals CA OXALATE CRYSTALS  (*)    All other components within normal limits  URINE CULTURE  TROPONIN I  TROPONIN I  POCT PREGNANCY, URINE   Imaging Review Ct Angio Chest Pe W/cm &/or Wo Cm  09/18/2013   CLINICAL DATA:  Shortness of breath with ambulation. History of gastric bypass surgery December 2014, cholecystectomy January 2015.  EXAM: CT ANGIOGRAPHY CHEST WITH CONTRAST  TECHNIQUE: Multidetector CT imaging of the chest was performed using the standard protocol during bolus administration of intravenous contrast. Multiplanar CT image reconstructions and MIPs were obtained to evaluate the vascular anatomy.  CONTRAST:  OMNIPAQUE IOHEXOL 350 MG/ML SOLN  COMPARISON:  Chest radiograph September 17, 2013.  FINDINGS: Moderately delayed bolus timing may limit sensitivity. No convincing evidence of pulmonary embolism to the subsegmental branches with the aforementioned limitation.  The heart and pericardium are unremarkable. Thoracic aorta appears normal.  No pleural effusions, focal consolidations. Trace dependent atelectasis. Tracheobronchial tree is patent, no pneumothorax. No lymphadenopathy by CT size criteria.  Included view of the abdomen is unremarkable. Soft tissues including the visualized thyroid gland are normal. Osseous structures are unremarkable.  Review of the MIP images confirms the above findings.  IMPRESSION: Suboptimal bolus timing without convincing evidence of pulmonary embolism nor acute cardiopulmonary process.   Electronically Signed   By: Awilda Metro   On: 09/18/2013 00:40   Ct Abdomen Pelvis W Contrast  09/17/2013   CLINICAL DATA:  Gastric sleeve December 2014. Cholecystectomy last week. Emesis.  EXAM: CT ABDOMEN AND PELVIS WITH CONTRAST  TECHNIQUE: Multidetector CT imaging of the abdomen and pelvis was performed using the standard protocol following bolus administration of intravenous contrast.  CONTRAST:  50mL OMNIPAQUE IOHEXOL 300 MG/ML SOLN, OMNIPAQUE IOHEXOL 300 MG/ML SOLN  COMPARISON:  DG  ABD ACUTE W/CHEST dated 09/17/2013; CT ABD/PELVIS W CM dated 09/02/2013  FINDINGS: Lung Bases: Normal.  Liver:  Normal.  Spleen:  Normal.  Gallbladder: Surgically absent with clips in the fossa. There is a tiny amount of fluid in the gallbladder fossa, likely postoperative seroma or hematoma. Biloma considered less likely. No enhancement or gas to suggest abscess.  Common bile duct:  Normal.  Pancreas:  Normal.  Adrenal glands:  Normal bilaterally.  Kidneys:  Normal enhancement of both kidneys.  Stomach: Postoperative changes of gastric sleeve. There is no intra-abdominal free air. No evidence of obstruction of the stomach. The stomach appears empty. Gastroesophageal junction is normal.  Small bowel: Duodenum appears normal. Small bowel is within normal limits.  Colon:   Normal appendix.  The colon is normal, mostly decompressed.  Pelvic Genitourinary: Small amount of free fluid is present in the anatomic pelvis. This may be physiologic or postoperative. This measures low-attenuation and there is no peripheral enhancement.  Bones:  L5-S1 spondylosis.  No aggressive osseous lesions.  Vasculature: Normal.  Body Wall: Stranding is present in the periumbilical region compatible with recent laparoscopic cholecystectomy.  IMPRESSION: 1. Cholecystectomy with tiny amount of fluid in the gallbladder fossa, likely representing seroma. No gas or peripheral enhancement to suggest abscess. Tiny biloma could have this appearance as well. 2. Postoperative changes of gastric sleeve without complication. 3. Stranding in the anterior abdominal wall is compatible with recent laparoscopic cholecystectomy. 4. No acute abnormality.   Electronically Signed   By: Andreas Newport M.D.   On: 09/17/2013 22:24   Dg Abd Acute W/chest  09/17/2013   CLINICAL DATA:  Abdomen pain  EXAM: ACUTE ABDOMEN SERIES (ABDOMEN 2 VIEW & CHEST 1 VIEW)  COMPARISON:  None.  FINDINGS: There is no evidence of dilated bowel loops or free intraperitoneal air. Bowel  content is noted throughout colon. No radiopaque calculi or other significant radiographic abnormality is seen. Prior cholecystectomy clips are identified in the right upper quadrant. Heart size and mediastinal contours are within normal limits. Both lungs are clear.  IMPRESSION: No bowel obstruction or free air. Constipation. No acute cardiopulmonary disease.   Electronically Signed   By: Sherian Rein M.D.   On: 09/17/2013 21:58    EKG Interpretation   None      Date: 09/18/2013  Rate: 104  Rhythm: sinus tachycardia  QRS Axis: right  Intervals: QT prolonged  ST/T  Wave abnormalities: nonspecific ST/T changes  Conduction Disutrbances:none  Narrative Interpretation:   Old EKG Reviewed: unchanged EKG analyzed and reviewed by this provider and attending physician.    MDM   Final diagnoses:  Abdominal pain  S/P laparoscopic cholecystectomy   Medications  ondansetron (ZOFRAN-ODT) disintegrating tablet 8 mg (8 mg Oral Given 09/17/13 1746)  sodium chloride 0.9 % bolus 1,000 mL (0 mLs Intravenous Stopped 09/18/13 0142)  iohexol (OMNIPAQUE) 300 MG/ML solution 50 mL (50 mLs Oral Contrast Given 09/17/13 2101)  iohexol (OMNIPAQUE) 300 MG/ML solution 100 mL (100 mLs Intravenous Contrast Given 09/17/13 2145)  sodium chloride 0.9 % bolus 1,000 mL (0 mLs Intravenous Stopped 09/18/13 0247)  HYDROmorphone (DILAUDID) injection 0.5 mg (0.5 mg Intravenous Given 09/17/13 2348)  diphenhydrAMINE (BENADRYL) injection 25 mg (25 mg Intravenous Given 09/17/13 2344)  iohexol (OMNIPAQUE) 350 MG/ML injection 100 mL (100 mLs Intravenous Contrast Given 09/18/13 0023)   Filed Vitals:   09/17/13 2321 09/18/13 0000 09/18/13 0043 09/18/13 0236  BP: 124/47 117/74 114/60 110/61  Pulse: 73 86 76 73  Temp:    97.9 F (36.6 C)  TempSrc:    Oral  Resp: 17 23 18 20   SpO2: 95% 94% 95% 95%    Patient presenting to the ED with chest tightness with intermittent shortness of breath that only occurs with walking - abdominal pain  with difficulty swallowing. Reported that these symptoms started on Friday. Reported that she contacted her surgeon on Friday who recommended fluids and protein shakes. Reported that she contacted her surgeon today due to not feeling any better recommended to come to emergency department to be assessed. Patient had gastric sleeve resection performed on 08/08/2013 and cholecystectomy with 09/07/2013 performed by Dr. Johna Sheriff. Alert and oriented. GCS 15. Patient sitting upright comfortably in bed. Heart rate and rhythm normal. Radial pulses 2+ bilaterally DP pulses 2+ bilaterally. Lungs clear to auscultation to upper and lower lobes bilaterally. Obese. 4 incision sites with laparoscopic surgery have been identified-healing well-negative for dizziness, swelling, erythema, inflammation, drainage noted. Discomfort upon palpation to epigastric and suprapubic region. Doubt acute abdomen, negative peritoneal signs. EKG noted mild sinus tachycardia with a heart rate of 104 beats per minute with rightward access-nonspecific ST and T wave abnormalities-negative ischemic findings noted. First troponin negative elevation. Second troponin negative elevation. CBC noted mild data White blood cell count of 11.3-negative leukocytosis or left shift noted. CMP negative findings. Lipase negative elevated lipase of 102. Troponin negative elevation. Urine pregnancy negative. Acute abdomen plain film with chest negative for bowel obstruction or free air. Constipation noted. CT abdomen and pelvis noted small amount of fluid in the gallbladder - likely representing seroma. Negative acute abnormalities noted to the CT scan of the abdomen and pelvis. Urinalysis noted elevated specific gravity of 1.040 moderate bilirubin with trace of leukocytes. Elevated d-dimer at 0.84. CT angiogram of chest negative findings of PE. Doubt SBO. Doubt perforation or issues with gastric sleeve. Doubt acute abdominal processes. Negative findings of abscess.  Negative findings for PE. Suspicion to be dehydration. Patient just underwent surgery approximately 10 days ago - has not been taking pain medications, was prescribed oxycodones. Patient able to tolerate fluids PO without difficulty. Negative episodes of emesis while in the ED setting. Pain controlled in ED setting. Patient stable, afebrile. Patient appears hemodynamically stable. Negative findings of sepsis. Discharged patient. Referred patient to her PCP, GI, and  Dr. Johna Sheriff. Discussed with patient to continue with clear diet. Discussed with patient to avoid any greasy, fatty foods. Discussed  with patient to rest and stay hydrated. Discussed with patient to avoid any physical or strenuous activity. Discussed with patient to closely monitor symptoms and if symptoms are to worsen or change to report back to the ED - strict return instructions given.  Patient agreed to plan of care, understood, all questions answered.   Raymon Mutton, PA-C 09/18/13 0945  Raymon Mutton, PA-C 09/18/13 [phone]

## 2013-09-17 NOTE — ED Notes (Signed)
PA aware of the need for Pain medication

## 2013-09-17 NOTE — Telephone Encounter (Signed)
Message copied by Maryan PulsMOORE, Yissel Habermehl on Mon Sep 17, 2013  2:22 PM ------      Message from: Marin ShutterHEGER, BRIGITT      Created: Mon Sep 17, 2013 10:40 AM      Regarding: Dr. Johna SheriffHoxworth      Contact: [phone]       This pt would need a RTW note faxed over to her work (they didn't accept the other one).      [phone] Fax      Ms. Downey       ------

## 2013-09-17 NOTE — Telephone Encounter (Signed)
Patient called to report that she has been able to take in some more food then when she had spoke to Dr. Ezzard StandingNewman over the weekend.  Patient states however that she is still having the shaking.  Patient has appt with Dr. Johna SheriffHoxworth on 09/19/13.  Patient states that Dr. Johna SheriffHoxworth has told her in the past to just call and we will send him a message to call her directly.  I explained I will be happy to send the message but with Dr. Johna SheriffHoxworth in the OR I am not sure if or when he will be able to call back.  Patient states understanding at this time.

## 2013-09-17 NOTE — ED Notes (Signed)
Pt transported to XRAY °

## 2013-09-17 NOTE — ED Notes (Signed)
Pt c/o burning at IV insertion site only not above or below or beside IV. IV is patent with out infiltration, rate of fluids have been decreased for patient tolerance.

## 2013-09-17 NOTE — ED Notes (Signed)
Pt returned from XRAY 

## 2013-09-17 NOTE — Telephone Encounter (Signed)
RTW note faxed to Ms. Downey at patient's request to [phone].

## 2013-09-17 NOTE — ED Notes (Signed)
Pt reports having a gastric sleeve procedure done in December, 2014. Pt also had her gallbladder removed last week. Pt reports being unable to keep down PO fluids. Pt reports only being able to drink 2 ounces of fluids despite attempting to drink more. Pt reports having a fullness feeling in her stomach and reports a decreased appetite. Pt reports shortness of breath with ambulation. Pt is A/O x4, vitals are WDL, and pt is in NAD.

## 2013-09-18 ENCOUNTER — Encounter (HOSPITAL_COMMUNITY): Payer: Self-pay

## 2013-09-18 LAB — TROPONIN I: Troponin I: <0.3 ng/mL (ref <0.30)

## 2013-09-18 MED ORDER — IOHEXOL 350 MG/ML SOLN
100.0000 mL | Freq: Once | INTRAVENOUS | Status: AC | PRN
  Administered 2013-09-18: 100 mL via INTRAVENOUS

## 2013-09-18 MED ORDER — ONDANSETRON HCL 4 MG PO TABS: 4.0000 mg | ORAL_TABLET | Freq: Four times a day (QID) | ORAL | Status: DC

## 2013-09-18 NOTE — ED Notes (Signed)
PT returned from CT

## 2013-09-18 NOTE — ED Notes (Signed)
Pt hs consumed about half the cup of water for PO challenge.

## 2013-09-18 NOTE — ED Notes (Signed)
Pt encouraged to drink at least half of the cup of water provided for PO challenge. Pt states "well that's going to take a long time because it hurts my stomach every time I drink."

## 2013-09-18 NOTE — ED Provider Notes (Signed)
Medical screening examination/treatment/procedure(s) were performed by non-physician practitioner and as supervising physician I was immediately available for consultation/collaboration.  EKG Interpretation   None        Tanay Massiah K Linker, MD 09/18/13 1504 

## 2013-09-18 NOTE — Discharge Instructions (Signed)
Please call your doctor for a followup appointment within 24-48 hours. When you talk to your doctor please let them know that you were seen in the emergency department and have them acquire all of your records so that they can discuss the findings with you and formulate a treatment plan to fully care for your new and ongoing problems. Please call and set-up an appointment with Dr. Johna Sheriff - general surgeon - to be re-assessed Please call and set-up an appointment with gastroenterology regarding issue Please rest and stay hydrated Please continue to take at home medications as prescribed Please continue with fluid diet - please decrease carbohydrates, grease, fat, and oils in diet Please continue to monitor symptoms closely and if symptoms are to worsen or change (fever greater than 101, chills, sweating, nausea, vomiting, chest pain, shortness of breath, difficulty breathing, worsening or changes to abdominal pain, numbness, tingling, inability to swallow foods or fluids) please report back to the ED    Abdominal Pain, Adult Many things can cause abdominal pain. Usually, abdominal pain is not caused by a disease and will improve without treatment. It can often be observed and treated at home. Your health care provider will do a physical exam and possibly order blood tests and X-rays to help determine the seriousness of your pain. However, in many cases, more time must pass before a clear cause of the pain can be found. Before that point, your health care provider may not know if you need more testing or further treatment. HOME CARE INSTRUCTIONS  Monitor your abdominal pain for any changes. The following actions may help to alleviate any discomfort you are experiencing:  Only take over-the-counter or prescription medicines as directed by your health care provider.  Do not take laxatives unless directed to do so by your health care provider.  Try a clear liquid diet (broth, tea, or water) as  directed by your health care provider. Slowly move to a bland diet as tolerated. SEEK MEDICAL CARE IF:  You have unexplained abdominal pain.  You have abdominal pain associated with nausea or diarrhea.  You have pain when you urinate or have a bowel movement.  You experience abdominal pain that wakes you in the night.  You have abdominal pain that is worsened or improved by eating food.  You have abdominal pain that is worsened with eating fatty foods. SEEK IMMEDIATE MEDICAL CARE IF:   Your pain does not go away within 2 hours.  You have a fever.  You keep throwing up (vomiting).  Your pain is felt only in portions of the abdomen, such as the right side or the left lower portion of the abdomen.  You pass bloody or black tarry stools. MAKE SURE YOU:  Understand these instructions.   Will watch your condition.   Will get help right away if you are not doing well or get worse.  Document Released: 05/05/2005 Document Revised: 05/16/2013 Document Reviewed: 04/04/2013 [facility] Patient Information 2014 [place], [place].   Emergency Department Resource Guide 1) Find a Doctor and Pay Out of Pocket Although you won't have to find out who is covered by your insurance plan, it is a good idea to ask around and get recommendations. You will then need to call the office and see if the doctor you have chosen will accept you as a new patient and what types of options they offer for patients who are self-pay. Some doctors offer discounts or will set up payment plans for their patients who do not  have insurance, but you will need to ask so you aren't surprised when you get to your appointment.  2) Contact Your Local Health Department Not all health departments have doctors that can see patients for sick visits, but many do, so it is worth a call to see if yours does. If you don't know where your local health department is, you can check in your phone book. The CDC also has a tool to help you  locate your state's health department, and many state websites also have listings of all of their local health departments.  3) Find a Walk-in Clinic If your illness is not likely to be very severe or complicated, you may want to try a walk in clinic. These are popping up all over the country in pharmacies, drugstores, and shopping centers. They're usually staffed by nurse practitioners or physician assistants that have been trained to treat common illnesses and complaints. They're usually fairly quick and inexpensive. However, if you have serious medical issues or chronic medical problems, these are probably not your best option.  No Primary Care Doctor: - Call Health Connect at  [phone] - they can help you locate a primary care doctor that  accepts your insurance, provides certain services, etc. - Physician Referral Service- [phone]  Chronic Pain Problems: Organization         Address  Phone   Notes  [facility]  [phone] Patients need to be referred by their primary care doctor.   Medication Assistance: Organization         Address  Phone   Notes  [facility] Medication [facility] [address] [place], [place] [address] [phone]) [phone] --Must be a resident of [place] [facility] [place] -- Must have NO insurance coverage whatsoever (no Medicaid/ Medicare, etc.) -- The pt. MUST have a primary care doctor that directs their care regularly and follows them in the community   MedAssist  [phone]) [phone]   [employer]  [phone]    Agencies that provide inexpensive medical care: Organization         Address  Phone   Notes  [facility]  [phone]   [facility]    [phone]) [phone]   [facility] [address] [place], [place] [address] [phone]) [phone]   [facility] [address], [place] [phone]) [phone]   [facility]anned Parenthood    [phone]) [phone]   [facility]    [phone]) [phone]   [facility]  [address], [place] Phone:  [phone], Fax:  [phone]) [phone] Hours of Operation:  9 am - 6 pm, M-F.  Also accepts Medicaid/Medicare and self-pay.  [facility]  [address], [place] Phone: [phone](336) [phone], Fax: [phone]) [phone]. Hours of Operation:  8:30 am - 5:30 pm, M-F.  Also accepts Medicaid and self-pay.  [facility] [place] [address], [place] Phone: [phone]) [phone]   [facility] [address], [place], [place] [phone], Ext. [phone] Mondays & Thursdays: 7-9 AM.  First 15 patients are seen on a first come, first serve basis.    Medicaid-accepting [facility] Providers:  Organization         Address  Phone   Notes  [facility] [address], [place] [phone] Also accepts self-pay patients.  [facility] [address], [place]  [phone]) [phone]   [facility] [address], [place] [phone]) [phone]   [facility] [address] [place] [address], [place] [phone]) [phone]   Renaye RakersVeita Bland [address], [place]   [phone]) [phone] Only accepts [place] Access [place] patients after they have their name applied to their card.   Self-Pay (no insurance) in [place] [facility]:  Organization         Address  Phone   Notes  Sickle Cell Patients, [facility] [address], [place] [phone]) [phone]   [facility] [address], [place] [phone]) [phone]   [facility] [place]  [address], [place] [phone]   [facility]/Dr. Osei-Bonsu  [address] [place] [address], [place] or [id number], [place] [phone]) [phone] Phone number for both [place] and [place] locations is the same.  [facility] [address],  [place] [phone]   [facility] [place] [address] [place] [address], [place] or [address] [phone]   [facility] [address], [place] [phone]) [phone], phone; [phone](336) [phone], fax Sees patients 1st and 3rd Saturday of every month.  Must not qualify for public or private insurance (i.e. Medicaid, Medicare, [place] Health Choice, Veterans' Benefits)  Household income should be no more than 200% of the poverty level The clinic cannot treat you if you are pregnant or think you are pregnant  Sexually transmitted diseases are not treated at the clinic.    Dental Care: Organization         Address  Phone  Notes  [facility] Department of [facility] [address], [place] [phone] Accepts children up to age 37 who are enrolled in [place] or [place] Health Choice; pregnant women with a Medicaid card; and children who have applied for Medicaid or [place] Health Choice, but were declined, whose parents can pay a reduced fee at time of service.  [facility] of [facility] [place]  [address], [place] [phone]) [phone] Accepts children up to age 37 who are enrolled in [place] or [place] Health Choice; pregnant women with a Medicaid card; and children who have applied for Medicaid or [place] Health Choice, but were declined, whose parents can pay a reduced fee at time of service.  [facility]  [address], [place] [phone]) [phone] Patients are seen by appointment only. Walk-ins are not accepted. [facility] will see patients 37 years of age and older. Monday - Tuesday (8am-5pm) Most Wednesdays (8:30-5pm) $30 per visit, cash only  [facility]  [address], [place] [facility] [place] [phone] Patients are seen by appointment only. Walk-ins are not accepted. [facility] will see patients 818 years of age and older. One Wednesday Evening  (Monthly: Volunteer Based).  $30 per visit, cash only  [employer] [facility]  [phone]) [phone] for adults; Children under age 654, call Graduate Pediatric Dentistry at [phone]) [phone]. Children aged 774-14, please call [phone]) [phone] to request a pediatric application.  Dental services are provided in all areas of dental care including fillings, crowns and bridges, complete and partial dentures, implants, gum treatment, root canals, and extractions. Preventive care is also provided. Treatment is provided to both adults and children. Patients are selected  via a lottery and there is often a waiting list.   [facility] [address], [place]  [phone] [email or web address]   [facility] [address] [place], [place] [phone], Ext. [phone] Second and Fourth Thursday of each month, opens at 6:30 AM; Clinic ends at 9 AM.  Patients are seen on a first-come first-served basis, and a limited number are seen during each clinic.   [facility]  [address] [place], [place] [phone]   Eligibility Requirements You must have lived in [place], [place], or [place] counties for at least the last three months.   You cannot be eligible for state or federal sponsored [place], including [employer], [place], or [employer].   You generally cannot be eligible for healthcare insurance through your employer.    How to apply: Eligibility screenings are held every Tuesday and Wednesday afternoon from 1:00 pm until 4:00 pm. You do not need an appointment for the interview!  [facility] [address] [place], [place] [phone]   [facility] Health Department  [phone]   [facility] Health Department  [phone]   [facility] Health Department  [phone]    Behavioral Health Resources in the Community: Intensive Outpatient Programs Organization         Address  Phone  Notes  [facility] [address], [place], [place] [phone]   [facility] Outpatient [address] [place], [place] [phone]   [facility] [address] [place], [place]  [phone]   [facility] [address]  [place], [place] [phone] or [phone]   Substance Abuse Resources Organization         Address  Phone  Notes  [facility] Drug Services  2125639078   [facility]  [phone]   [facility] [place]  [phone]   Floydene Flock  [phone]   Residential & Outpatient Substance Abuse Program  [phone]   Psychological Services Organization         Address  Phone  Notes  [facility]al Health  [phone]   [facility] Services  [phone]   [facility] [address], [place] [phone] or [phone]    Mobile Crisis Teams Organization         Address  Phone  Notes  [facility], Mobile Crisis Care Unit  [phone]   [facility]  [address] [place], [place] [phone]   Doristine Locks [address] [place] [phone]    Self-Help/Support Groups Organization         Address  Phone             Notes  [facility] of [place] - variety of support groups  336- I7437963 Call for more information  Narcotics Anonymous (NA), [facility] [address], [employer] [place]  2 meetings at this location   Statistician         Address  Phone  Notes  [facility] [address],    [place]  [phone]   [facility]  [address] [place] [address], [place], [place] [phone]   [facility] [address] [place], [place] [phone] Admissions: 8am-3pm M-F  [facility] [address]    [place], [place] [phone]   [facility] [address]  [place], [place] [phone]   [facility] [address],  [place], [place] [phone]   [facility] - Intensive Outpatient [address], [place], [place] [phone]   [facility] ([facility] [address] [place]  [place], [place] [phone] or [phone])[phone]   [facility] (RTS) [address], [place], [place] [phone] Accepts Medicaid  [facility] [address]  [place] [phone] Substance Abuse/Addiction Treatment   [facility] Organization         Address  Phone  Notes  [facility]  [phone]   Angie Fava, PhD [address] [place], [place]   [phone] or [phone]   [facility] Behavioral   [address] [place], [place] [phone]   [facility] [address], [place], [place] [phone] Insurance/Medicaid/sponsorship through [facility] and Families [address]                                    [place], [place] [phone] Therapy/tele-psych/case  [facility] [address], [place] [phone]    Dr. Lolly Mustache  [phone]   [facility] [place]  [employer] [facility] Dept. 1) [address] [place] 2) [address], [place] 3)  [address], [place] [phone]  [phone]   [facility] Child Abuse Hotline 289-218-4903 or [phone] (After Hours)

## 2013-09-18 NOTE — ED Notes (Signed)
Pt transported to CT ?

## 2013-09-19 ENCOUNTER — Encounter (INDEPENDENT_AMBULATORY_CARE_PROVIDER_SITE_OTHER): Payer: Self-pay | Admitting: General Surgery

## 2013-09-19 ENCOUNTER — Ambulatory Visit (INDEPENDENT_AMBULATORY_CARE_PROVIDER_SITE_OTHER): Payer: BC Managed Care – PPO | Admitting: General Surgery

## 2013-09-19 VITALS — BP 120/82 | HR 72 | Resp 18 | Ht 64.5 in | Wt 228.8 lb

## 2013-09-19 DIAGNOSIS — Z09 Encounter for follow-up examination after completed treatment for conditions other than malignant neoplasm: Secondary | ICD-10-CM

## 2013-09-19 LAB — URINE CULTURE: Colony Count: 65000

## 2013-09-19 MED ORDER — HYOSCYAMINE SULFATE 0.125 MG SL SUBL: 0.1250 mg | SUBLINGUAL_TABLET | SUBLINGUAL | Status: DC | PRN

## 2013-09-19 MED ORDER — ONDANSETRON HCL 4 MG PO TABS: 4.0000 mg | ORAL_TABLET | Freq: Four times a day (QID) | ORAL | Status: DC

## 2013-09-19 NOTE — Progress Notes (Signed)
Chief complaint: Followup sleeve gastrectomy and subsequent cholecystectomy  History: Patient returns to the office for followup status post sleeve gastrectomy on 08/08/2013. Her postoperative course was entirely unremarkable for the first 3 weeks and she presented with persistent right upper quadrant and epigastric pain and nausea. She had an extensive workup which showed no evidence of problems with her sleeve on CT scan and endoscopy. She did have gallbladder sludge and persistent right upper quadrant pain and we elected to proceed with laparoscopic cholecystectomy during that hospitalization about 10 days ago. She seemed to have some chronic inflammation of her gallbladder at the time of surgery and improved while in the hospital to where she was ready for discharge.  Since being at home however she has continued to struggle somewhat. She gets substernal and epigastric pain when eating or drinking. Some nausea but no vomiting. However she has enough difficulty that she feels that she is barely getting enough fluids in. She was evaluated in the emergency room 2 nights ago feeling weak and dehydrated. However her lab work was unremarkable and they repeated another CT scan that showed no intra-abdominal problems. CT of the chest was also performed due to some shortness of breath to rule out PE and this was negative. She is able to tolerate a small to moderate amount of fluids. She continues to have some substernal and epigastric pain particularly with eating. No vomiting.  Exam: BP 120/82  Pulse 72  Resp 18  Ht 5' 4.5" (1.638 m)  Wt 228 lb 12.8 oz (103.783 kg)  BMI 38.68 kg/m2  LMP 09/03/2013 Total weight loss from surgery 25 pounds, 11 pounds since her visit on January 16. General: She does not appear ill and is smiling and conversant in the office. Abdomen: Soft with minimal epigastric tenderness. Wounds all healing well.  Assessment and plan: persistent abdominal and substernal discomfort with  eating and difficulty with oral intake, status post sleeve gastrectomy and subsequent cholecystectomy. She has had multiple imaging studies and endoscopy as well as laparoscopic exam of her sleeve but has not shown any demonstrable problem. Recent lab work did show minimally elevated lipase at about 100. Pancreas appeared normal on CT scan. She could have some mild irritation or pancreas from her gastric surgery or if she was passing sludge in her common bile duct. Both of these should resolve without intervention.. Her endoscopy showed no inflammation but did show a lot of spasm of her sleeve. I discussed this with her and suggested we might try an antispasmodic and I did give her a prescription for Levsin to use when necessary. Right now I do not think she is dehydrated or losing weight excessively. We will follow her closely as an outpatient. I think with extensive negative workup we have ruled out major problems as best we can and I suspect that she may well gradually improved with time. She will call as needed anytime but otherwise I will see her back in 2 weeks.

## 2013-09-20 ENCOUNTER — Ambulatory Visit (INDEPENDENT_AMBULATORY_CARE_PROVIDER_SITE_OTHER): Payer: BC Managed Care – PPO | Admitting: Internal Medicine

## 2013-09-20 ENCOUNTER — Other Ambulatory Visit (INDEPENDENT_AMBULATORY_CARE_PROVIDER_SITE_OTHER): Payer: Self-pay | Admitting: General Surgery

## 2013-09-20 ENCOUNTER — Encounter: Payer: Self-pay | Admitting: Internal Medicine

## 2013-09-20 ENCOUNTER — Telehealth: Payer: Self-pay

## 2013-09-20 VITALS — BP 106/72 | HR 74 | Temp 97.5°F | Resp 16 | Ht 64.0 in | Wt 228.8 lb

## 2013-09-20 DIAGNOSIS — E538 Deficiency of other specified B group vitamins: Secondary | ICD-10-CM

## 2013-09-20 DIAGNOSIS — K219 Gastro-esophageal reflux disease without esophagitis: Secondary | ICD-10-CM | POA: Insufficient documentation

## 2013-09-20 MED ORDER — CYANOCOBALAMIN 1000 MCG/ML IJ SOLN
1000.0000 ug | Freq: Once | INTRAMUSCULAR | Status: AC
  Administered 2013-09-20: 1000 ug via INTRAMUSCULAR

## 2013-09-20 NOTE — Progress Notes (Signed)
Subjective:    Patient ID: Robyn Bryant, female    DOB: January 17, 1976, 38 y.o.   MRN: [id number]  Gastrophageal Reflux She complains of heartburn. She reports no abdominal pain, no belching, no chest pain, no choking, no coughing, no dysphagia, no early satiety, no globus sensation, no hoarse voice, no nausea, no sore throat, no stridor, no tooth decay, no water brash or no wheezing. This is a new problem. The current episode started 1 to 4 weeks ago. The problem occurs occasionally. The problem has been rapidly improving. The heartburn duration is less than a minute. The heartburn is of moderate intensity. The heartburn does not wake her from sleep. The heartburn does not limit her activity. The heartburn doesn't change with position. Nothing aggravates the symptoms. Associated symptoms include anemia and fatigue. Pertinent negatives include no melena, muscle weakness, orthopnea or weight loss. She has tried a PPI for the symptoms. The treatment provided moderate relief. Past procedures include an EGD (EGD was done a few weeks ago).      Review of Systems  Constitutional: Positive for fatigue. Negative for chills, weight loss, diaphoresis, activity change, appetite change and unexpected weight change.  HENT: Negative.  Negative for hoarse voice and sore throat.   Eyes: Negative.   Respiratory: Negative.  Negative for cough, choking and wheezing.   Cardiovascular: Negative.  Negative for chest pain, palpitations and leg swelling.  Gastrointestinal: Positive for heartburn. Negative for dysphagia, nausea, vomiting, abdominal pain, diarrhea, constipation, blood in stool, melena, abdominal distention, anal bleeding and rectal pain.  Endocrine: Negative.   Musculoskeletal: Negative.  Negative for muscle weakness.  Skin: Negative.   Allergic/Immunologic: Negative.   Neurological: Negative.   Hematological: Negative.  Negative for adenopathy.  Psychiatric/Behavioral: Negative.        Objective:    Physical Exam  Vitals reviewed. Constitutional: She is oriented to person, place, and time. She appears well-developed and well-nourished. No distress.  HENT:  Head: Normocephalic and atraumatic.  Mouth/Throat: Oropharynx is clear and moist. No oropharyngeal exudate.  Eyes: Conjunctivae are normal. Right eye exhibits no discharge. Left eye exhibits no discharge. No scleral icterus.  Neck: Normal range of motion. Neck supple. No JVD present. No tracheal deviation present. No thyromegaly present.  Cardiovascular: Normal rate, regular rhythm, normal heart sounds and intact distal pulses.  Exam reveals no gallop and no friction rub.   No murmur heard. Pulmonary/Chest: Effort normal and breath sounds normal. No stridor. No respiratory distress. She has no wheezes. She has no rales. She exhibits no tenderness.  Abdominal: Soft. Bowel sounds are normal. She exhibits no distension and no mass. There is no tenderness. There is no rebound and no guarding.  Musculoskeletal: Normal range of motion. She exhibits no edema and no tenderness.  Lymphadenopathy:    She has no cervical adenopathy.  Neurological: She is oriented to person, place, and time.  Skin: Skin is warm and dry. No rash noted. She is not diaphoretic. No erythema. No pallor.  Psychiatric: She has a normal mood and affect. Her behavior is normal. Judgment and thought content normal.     Lab Results  Component Value Date   WBC 11.3* 09/17/2013   HGB 14.4 09/17/2013   HCT 43.0 09/17/2013   PLT 346 09/17/2013   GLUCOSE 100* 09/17/2013   CHOL 157 03/28/2013   TRIG 109.0 03/28/2013   HDL 37.70* 03/28/2013   LDLCALC 98 03/28/2013   ALT 73* 09/17/2013   AST 27 09/17/2013   NA 140 09/17/2013  K 3.7 09/17/2013   CL 99 09/17/2013   CREATININE 0.70 09/17/2013   BUN 12 09/17/2013   CO2 22 09/17/2013   TSH 2.14 03/28/2013   HGBA1C 5.9 03/28/2013       Assessment & Plan:

## 2013-09-20 NOTE — Assessment & Plan Note (Signed)
B12 injection today 

## 2013-09-20 NOTE — Patient Instructions (Signed)
Gastroesophageal Reflux Disease, Adult  Gastroesophageal reflux disease (GERD) happens when acid from your stomach flows up into the esophagus. When acid comes in contact with the esophagus, the acid causes soreness (inflammation) in the esophagus. Over time, GERD may create small holes (ulcers) in the lining of the esophagus.  CAUSES   · Increased body weight. This puts pressure on the stomach, making acid rise from the stomach into the esophagus.  · Smoking. This increases acid production in the stomach.  · Drinking alcohol. This causes decreased pressure in the lower esophageal sphincter (valve or ring of muscle between the esophagus and stomach), allowing acid from the stomach into the esophagus.  · Late evening meals and a full stomach. This increases pressure and acid production in the stomach.  · A malformed lower esophageal sphincter.  Sometimes, no cause is found.  SYMPTOMS   · Burning pain in the lower part of the mid-chest behind the breastbone and in the mid-stomach area. This may occur twice a week or more often.  · Trouble swallowing.  · Sore throat.  · Dry cough.  · Asthma-like symptoms including chest tightness, shortness of breath, or wheezing.  DIAGNOSIS   Your caregiver may be able to diagnose GERD based on your symptoms. In some cases, X-rays and other tests may be done to check for complications or to check the condition of your stomach and esophagus.  TREATMENT   Your caregiver may recommend over-the-counter or prescription medicines to help decrease acid production. Ask your caregiver before starting or adding any new medicines.   HOME CARE INSTRUCTIONS   · Change the factors that you can control. Ask your caregiver for guidance concerning weight loss, quitting smoking, and alcohol consumption.  · Avoid foods and drinks that make your symptoms worse, such as:  · Caffeine or alcoholic drinks.  · Chocolate.  · Peppermint or mint flavorings.  · Garlic and onions.  · Spicy foods.  · Citrus fruits,  such as oranges, lemons, or limes.  · Tomato-based foods such as sauce, chili, salsa, and pizza.  · Fried and fatty foods.  · Avoid lying down for the 3 hours prior to your bedtime or prior to taking a nap.  · Eat small, frequent meals instead of large meals.  · Wear loose-fitting clothing. Do not wear anything tight around your waist that causes pressure on your stomach.  · Raise the head of your bed 6 to 8 inches with wood blocks to help you sleep. Extra pillows will not help.  · Only take over-the-counter or prescription medicines for pain, discomfort, or fever as directed by your caregiver.  · Do not take aspirin, ibuprofen, or other nonsteroidal anti-inflammatory drugs (NSAIDs).  SEEK IMMEDIATE MEDICAL CARE IF:   · You have pain in your arms, neck, jaw, teeth, or back.  · Your pain increases or changes in intensity or duration.  · You develop nausea, vomiting, or sweating (diaphoresis).  · You develop shortness of breath, or you faint.  · Your vomit is green, yellow, black, or looks like coffee grounds or blood.  · Your stool is red, bloody, or black.  These symptoms could be signs of other problems, such as heart disease, gastric bleeding, or esophageal bleeding.  MAKE SURE YOU:   · Understand these instructions.  · Will watch your condition.  · Will get help right away if you are not doing well or get worse.  Document Released: 05/05/2005 Document Revised: 10/18/2011 Document Reviewed: 02/12/2011  [employer]® Patient   Information ©2014 [employer].

## 2013-09-20 NOTE — Telephone Encounter (Signed)
The patient called and is upset that her apt was made for the wrong day.  When the patient called in, I looked at her chart and her B12 shot was scheduled for 2/13 - this appointment was orginially scheduled by Harriett SineNancy (on the pt's way out of the office).  She called in because she realized the error and stated she needed the apt for 3/13.  I changed the appointment to the correct month.   She stated she wanted to speak with a manager because of this error.  I informed her the manager of the office would call back as soon as possible.

## 2013-09-20 NOTE — Assessment & Plan Note (Signed)
She is improving on her current regimen

## 2013-09-20 NOTE — Progress Notes (Signed)
Pre visit review using our clinic review tool, if applicable. No additional management support is needed unless otherwise documented below in the visit note. 

## 2013-09-21 ENCOUNTER — Ambulatory Visit: Payer: BC Managed Care – PPO

## 2013-09-24 ENCOUNTER — Encounter (INDEPENDENT_AMBULATORY_CARE_PROVIDER_SITE_OTHER): Payer: Self-pay

## 2013-09-24 ENCOUNTER — Telehealth (INDEPENDENT_AMBULATORY_CARE_PROVIDER_SITE_OTHER): Payer: Self-pay

## 2013-09-24 NOTE — Telephone Encounter (Signed)
RTW note faxed to Ms. Downey per patient's request.  Fax confirmation rec'd and attached to outgoing fax.

## 2013-09-24 NOTE — Telephone Encounter (Signed)
Message copied by Maryan PulsMOORE, Nunzio Banet on Mon Sep 24, 2013  9:33 AM ------      [facility]      Created: Thu Sep 20, 2013 10:58 AM       PLEASE FAX HER WORK NOTE TO FAX # [phone] ------

## 2013-10-05 ENCOUNTER — Ambulatory Visit (INDEPENDENT_AMBULATORY_CARE_PROVIDER_SITE_OTHER): Payer: BC Managed Care – PPO | Admitting: General Surgery

## 2013-10-05 ENCOUNTER — Encounter (INDEPENDENT_AMBULATORY_CARE_PROVIDER_SITE_OTHER): Payer: Self-pay | Admitting: General Surgery

## 2013-10-05 ENCOUNTER — Telehealth (INDEPENDENT_AMBULATORY_CARE_PROVIDER_SITE_OTHER): Payer: Self-pay

## 2013-10-05 VITALS — BP 122/80 | HR 60 | Temp 98.5°F | Resp 16 | Ht 64.0 in | Wt 223.6 lb

## 2013-10-05 DIAGNOSIS — Z09 Encounter for follow-up examination after completed treatment for conditions other than malignant neoplasm: Secondary | ICD-10-CM

## 2013-10-05 MED ORDER — SUCRALFATE 1 GM/10ML PO SUSP: 1.0000 g | Freq: Three times a day (TID) | ORAL | Status: DC

## 2013-10-05 NOTE — Addendum Note (Signed)
Addended by: Maryan PulsMOORE, Janai Maudlin on: 10/05/2013 03:24 PM   Modules accepted: Orders

## 2013-10-05 NOTE — Telephone Encounter (Signed)
Called and spoke to patient to move appointment time to 2:45pm today w/Dr. Johna SheriffHoxworth

## 2013-10-05 NOTE — Progress Notes (Signed)
Chief complaint: Followup sleeve gastrectomy  History: This return to the office with history of laparoscopic sleeve gastrectomy on 08/08/2012. She did very well for 3 weeks but then presented with persistent nausea and vomiting and right upper quadrant pain and reflux like symptoms. She had an extensive workup in the hospital that was essentially negative although she had gallbladder sludge. She underwent laparoscopic cholecystectomy and careful intraoperative evaluation of her sleeve which appeared normal. She also had a normal EGD and CT scan at that time. She very slowly has been feeling better since that second procedure. Today she actually states she is feeling quite a bit better. She is not having any further pain. She is smiling and appears well. She does get some minor vomiting or regurgitation at the end of a meal almost daily but this is minimal and tolerable.  Exam: BP 122/80  Pulse 60  Temp(Src) 98.5 F (36.9 C) (Oral)  Resp 16  Ht 5\' 4"  (1.626 m)  Wt 223 lb 9.6 oz (101.424 kg)  BMI 38.36 kg/m2  LMP 09/03/2013 Total weight loss 30 pounds, 5 pounds since last visit on 2/11 General: Appears well Abdomen: Soft and nontender. Is well healed.  Assessment and plan: At this point I think she is doing much better. We discussed how important is to eat very slowly with small bites and stopped before she feels too full. Weight loss is appropriate. Return in one month for her three-month visit.

## 2013-10-09 ENCOUNTER — Telehealth (INDEPENDENT_AMBULATORY_CARE_PROVIDER_SITE_OTHER): Payer: Self-pay | Admitting: *Deleted

## 2013-10-09 NOTE — Telephone Encounter (Signed)
Patient is also status post recent sleeve gastrectomy. She has recurrent symptoms with a extensive negative workup. It would be fine to call in Zofran 4 mg one every 6 hours when necessary #20 and Carafate 1 4 times a day if she does not have these prescriptions any longer. It is okay for her to see how this goes for a day or 2 and call us if it is not getting better.

## 2013-10-09 NOTE — Telephone Encounter (Signed)
Patient called to report that she is unable to keep anything down, vomiting, and it is so bad that she is having to leave work.  I explained that this is not likely due to her lap chole since that was on 09/07/13.  I explained since this is coming up all of a sudden then she could have some type of GI bug.  I started to advise patient that she could be seen in the ED, PMD or I could send a message to Dr. Johna SheriffHoxworth however patient heard ED and told me to stop talking.  She said there is no reason for her to go to the ED and I just needed to call her in some medication.  When I again tried to explain that because of the symptoms being sudden and so extreme we can't know without an evaluation what is actually going on patient states that she will just call back and leave a message for Dr. Johna SheriffHoxworth to directly call her back because she just wants medication called in.  Patient then hung up before I could say anything else.

## 2013-10-09 NOTE — Telephone Encounter (Signed)
Patient states she has already been using the Zofran without relief.  Is there anything else you would like to try?

## 2013-10-10 ENCOUNTER — Encounter: Payer: BC Managed Care – PPO | Attending: General Surgery | Admitting: Dietician

## 2013-10-10 VITALS — Ht 64.0 in | Wt 223.5 lb

## 2013-10-10 DIAGNOSIS — E66813 Obesity, class 3: Secondary | ICD-10-CM

## 2013-10-10 DIAGNOSIS — Z713 Dietary counseling and surveillance: Secondary | ICD-10-CM | POA: Insufficient documentation

## 2013-10-10 NOTE — Patient Instructions (Signed)
Goals:  Follow Phase 3B: High Protein + Non-Starchy Vegetables  Eat 3-6 small meals/snacks, every 3-5 hrs  Increase lean protein foods to meet 60g goal - have a Premier Shake every day   Increase fluid intake to 64oz +  Avoid drinking 15 minutes before, during and 30 minutes after eating  Aim for >30 min of physical activity daily  Start taking Protonix again

## 2013-10-10 NOTE — Progress Notes (Signed)
  Follow-up visit:  8 Weeks Post-Operative Sleeve Surgery  Medical Nutrition Therapy:  Appt start time: 5329 end time:  9242.  Primary concerns today: Post-operative Bariatric Surgery Nutrition Management. Robyn Bryant returns with a 16.5 lbs fat loss since Post Op class. Had a cholecystectomy in late January after surgery and states she is having a lot of acid reflux, which is new after surgery.    Reports throwing up every day and not sure what causes it except maybe eating too much. Beef and pork are especially difficult to get in. Reports not ever being hungry or thirsty. Not getting fluid in. Reports not taking Protonix since she was afraid she was taking "too many pills that do the same thing". Encourage her to take all medication as prescribed to help avoid nausea, vomiting, and acid reflux.   Surgery date: 08/08/2013 Surgery type: Sleeve Starting weight at [facility]: 254 lbs on 11/14  Weight today: 223.5 lbs lbs  Weight change: 16.5 lbs, 21.5 lbs fat loss Total weight lost: 30.5 lbs  TANITA  BODY COMP RESULTS  08/21/13 10/10/13   BMI (kg/m^2) 41.2 38.4   Fat Mass (lbs) 130.5 109.0   Fat Free Mass (lbs) 109.5 114.5   Total Body Water (lbs) 80.0 84.0    Preferred Learning Style:   No preference indicated   Learning Readiness:   Ready  24-hr recall: B (AM): hard boiled egg (6 g) Snk (AM): yoplait yogurt (5 g)  L (PM):  2 oz baked chicken (14 g) Snk (PM): 2 oz baked chicken (14 g) D (PM): 1/2 Premier Protein (15 g) Snk (PM): none  Fluid intake: 6 oz protein shake + 32 oz water (38 oz total) Estimated total protein intake: ~ 55 g  Medications: see list  Supplementation: not taking calcium doesn't like chewable vitamins and has trouble swallowing  Using straws: No Drinking while eating: No Hair loss: No Carbonated beverages: No N/V/D/C: having nausea and vomiting every day especially if eats later in the day especially if eats too much and feels nauseas every day  Dumping  syndrome: No  Recent physical activity:  No, just cleared on Friday but walks a lot at work  Progress Towards Goal(s):  In progress.  Handouts given during visit include:  Phase 3B High Protein + Non Starchy Vegetables   Nutritional Diagnosis:  Epes-3.3 Overweight/obesity related to past poor dietary habits and physical inactivity as evidenced by patient w/ recent sleeve gastrectomy surgery following dietary guidelines for continued weight loss.    Intervention:  Nutrition education/diet advancement. Discussed strategies to increase fluid intake, ensure protein goals are met, and minimize nausea and vomiting.   Teaching Method Utilized:  Visual Auditory Hands on  Barriers to learning/adherence to lifestyle change: daily nausea and vomiting  Demonstrated degree of understanding via:  Teach Back   Monitoring/Evaluation:  Dietary intake, exercise, lap band fills, and body weight. Follow up in 1 months for 3 month post-op visit.

## 2013-10-10 NOTE — Telephone Encounter (Signed)
Is there something other than Zofran that can be called in for the patient since the Zofran is not working?

## 2013-10-10 NOTE — Telephone Encounter (Signed)
Can call and Phenergan 12-1/2 mg by mouth every 6 hours when necessary #20 with one refill

## 2013-10-11 ENCOUNTER — Other Ambulatory Visit (INDEPENDENT_AMBULATORY_CARE_PROVIDER_SITE_OTHER): Payer: Self-pay | Admitting: *Deleted

## 2013-10-11 MED ORDER — PROMETHAZINE HCL 12.5 MG PO TABS: 12.5000 mg | ORAL_TABLET | Freq: Four times a day (QID) | ORAL | Status: DC | PRN

## 2013-10-11 NOTE — Telephone Encounter (Signed)
Prescription escribed to patient patient's pharmacy at this time per below message from Dr. Johna SheriffHoxworth.

## 2013-10-19 ENCOUNTER — Ambulatory Visit (INDEPENDENT_AMBULATORY_CARE_PROVIDER_SITE_OTHER): Payer: BC Managed Care – PPO | Admitting: *Deleted

## 2013-10-19 DIAGNOSIS — E538 Deficiency of other specified B group vitamins: Secondary | ICD-10-CM

## 2013-10-19 MED ORDER — CYANOCOBALAMIN 1000 MCG/ML IJ SOLN
1000.0000 ug | Freq: Once | INTRAMUSCULAR | Status: AC
  Administered 2013-10-19: 1000 ug via INTRAMUSCULAR

## 2013-11-01 ENCOUNTER — Telehealth (INDEPENDENT_AMBULATORY_CARE_PROVIDER_SITE_OTHER): Payer: Self-pay

## 2013-11-01 NOTE — Telephone Encounter (Signed)
Called and left message for patient to call our office RE:  appt time for 11/02/13 need's to be changed to 3:00pm or reschedule to a different day due to change in provider's schedule

## 2013-11-02 ENCOUNTER — Ambulatory Visit (INDEPENDENT_AMBULATORY_CARE_PROVIDER_SITE_OTHER): Payer: BC Managed Care – PPO | Admitting: General Surgery

## 2013-11-02 ENCOUNTER — Encounter (INDEPENDENT_AMBULATORY_CARE_PROVIDER_SITE_OTHER): Payer: Self-pay | Admitting: General Surgery

## 2013-11-02 VITALS — BP 100/70 | HR 78 | Temp 96.6°F | Resp 16 | Ht 64.0 in | Wt 215.0 lb

## 2013-11-02 NOTE — Progress Notes (Signed)
Chief complaint: Followup sleeve gastrectomy  History: The patient returns for followup 3 months following laparoscopic sleeve gastrectomy for morbid obesity. She did very well in the early postoperative period and then several weeks later developed persistent vomiting and epigastric and right upper quadrant abdominal pain. She had a thorough workup with no cause found other than sludge in her gallbladder. She underwent laparoscopic cholecystectomy. No problems were identified with her sleeve. She improved but continued to have episodic pain and nausea 4 weeks afterwards. She returns to the office today and have the report she has been doing significantly better. In recent weeks she will notice only rare vomiting and she says this occurs when she tries to eat too fast or when she has tried before pork which is not tolerate. She is able to be chicken and exit other high protein sources and is meeting her protein requirements. No nausea or abdominal pain. Working full time and she is reactive walking 4 hours at a time on her job.  Exam: BP 100/70  Pulse 78  Temp(Src) 96.6 F (35.9 C) (Oral)  Resp 16  Ht 5\' 4"  (1.626 m)  Wt 215 lb (97.523 kg)  BMI 36.89 kg/m2 Total weight loss 39 pounds, 9 pounds since last visit General: Appears well Abdomen: Soft and nontender. Incisions well healed  Assessment and plan: At this point doing very well following sleeve gastrectomy with progressive weight loss and no complications identified. Return in 3 months.

## 2013-11-07 ENCOUNTER — Ambulatory Visit: Payer: BC Managed Care – PPO | Admitting: Dietician

## 2013-11-13 ENCOUNTER — Encounter: Payer: BC Managed Care – PPO | Attending: General Surgery | Admitting: Dietician

## 2013-11-13 VITALS — Ht 64.0 in | Wt 212.5 lb

## 2013-11-13 DIAGNOSIS — Z713 Dietary counseling and surveillance: Secondary | ICD-10-CM | POA: Insufficient documentation

## 2013-11-13 DIAGNOSIS — E669 Obesity, unspecified: Secondary | ICD-10-CM

## 2013-11-13 NOTE — Patient Instructions (Addendum)
Goals:  Follow Phase 3B: High Protein + Non-Starchy Vegetables  Eat 3-6 small meals/snacks, every 3-5 hrs  Increase lean protein foods to meet 60g goal - low fat cheese, yogurt, seafood, deli meat   Aim for >30 min of physical activity daily  Take Protonix consistently  Look for a new Calcium citrate supplement     Potassium-rich foods: tomatoes, Brussels sprouts, asparagus, spinach and other greens, mushrooms  Try Miralax if constipation continues     08/21/13 10/10/13 11/13/13   BMI (kg/m^2) 41.2 38.4 36.5   Fat Mass (lbs) 130.5 109.0 105   Fat Free Mass (lbs) 109.5 114.5 107.5   Total Body Water (lbs) 80.0 84.0 78.5

## 2013-11-13 NOTE — Progress Notes (Signed)
  Follow-up visit:  12 Weeks Post-Operative Sleeve Surgery  Medical Nutrition Therapy:  Appt start time: 930 end time: 1000   Primary concerns today: Post-operative Bariatric Surgery Nutrition Management. Aryani returns with a 16.5 lbs fat loss since Post Op class. Had a cholecystectomy in late January after surgery and states she is having a lot of acid reflux, which is new after surgery.    Still having severe acid reflux, but vomiting is becoming less frequent, "only when things get stuck." Zilah is inconsistent with protonix and takes them only a few times a week. She reports having a good appetite and has to eat very slowly. Unable to tolerate beef and pork.  Surgery date: 08/08/2013 Surgery type: Sleeve Starting weight at [facility]: 254 lbs on 11/14  Weight today: 212.5 lbs  Weight change: 11lbs Total weight lost: 41.5 lbs  TANITA  BODY COMP RESULTS  08/21/13 10/10/13 11/13/13   BMI (kg/m^2) 41.2 38.4 36.5   Fat Mass (lbs) 130.5 109.0 105   Fat Free Mass (lbs) 109.5 114.5 107.5   Total Body Water (lbs) 80.0 84.0 78.5    Preferred Learning Style:   No preference indicated   Learning Readiness:   Ready  24-hr recall: *Nothing before 10am B (10:30AM): 2 eggs with 2 Tbsp shredded cheese (21 g) Snk (AM): Yoplait or cheese stick (7g)  L (PM):  Tuna salad (7 g) Snk (PM): none D (PM): 2-3 oz baked chicken, sometimes with steamed vegetables (14-21 g)  Snk (PM): none  Fluid intake: water, diluted apple juice (38 oz total) Estimated total protein intake:   Medications: see list  Supplementation: B12 shot every month, sometimes forgets MVI, not taking Calcium  Using straws: No Drinking while eating: No Hair loss: Yes Carbonated beverages: No N/V/D/C: some n/v; acid reflux; constipation Dumping syndrome: No  Recent physical activity: Gym sporadically, walks a lot at work  Progress Towards Goal(s):  In progress.    Nutritional Diagnosis:  [place]-3.3 Overweight/obesity  related to past poor dietary habits and physical inactivity as evidenced by patient w/ recent sleeve gastrectomy surgery following dietary guidelines for continued weight loss.    Intervention:  Nutrition education/diet advancement. Discussed strategies to increase fluid intake, ensure protein goals are met, and minimize nausea and vomiting.   Teaching Method Utilized:  Visual Auditory Hands on  Barriers to learning/adherence to lifestyle change:acid reflux  Demonstrated degree of understanding via:  Teach Back   Monitoring/Evaluation:  Dietary intake, exercise, and body weight. Follow up in 2 months for 5 month post-op visit.

## 2013-11-18 ENCOUNTER — Other Ambulatory Visit (INDEPENDENT_AMBULATORY_CARE_PROVIDER_SITE_OTHER): Payer: Self-pay | Admitting: General Surgery

## 2013-11-19 MED ORDER — ONDANSETRON HCL 4 MG PO TABS: 4.0000 mg | ORAL_TABLET | Freq: Four times a day (QID) | ORAL | Status: DC

## 2013-11-21 ENCOUNTER — Ambulatory Visit: Payer: BC Managed Care – PPO

## 2013-11-22 ENCOUNTER — Ambulatory Visit: Payer: BC Managed Care – PPO | Admitting: Internal Medicine

## 2013-11-23 ENCOUNTER — Encounter: Payer: Self-pay | Admitting: Internal Medicine

## 2013-11-23 ENCOUNTER — Ambulatory Visit (INDEPENDENT_AMBULATORY_CARE_PROVIDER_SITE_OTHER): Payer: BC Managed Care – PPO | Admitting: Internal Medicine

## 2013-11-23 ENCOUNTER — Ambulatory Visit: Payer: BC Managed Care – PPO

## 2013-11-23 VITALS — BP 118/80 | HR 63 | Temp 97.5°F | Resp 16 | Ht 64.0 in | Wt 212.4 lb

## 2013-11-23 DIAGNOSIS — N63 Unspecified lump in unspecified breast: Secondary | ICD-10-CM

## 2013-11-23 DIAGNOSIS — E538 Deficiency of other specified B group vitamins: Secondary | ICD-10-CM

## 2013-11-23 DIAGNOSIS — N6320 Unspecified lump in the left breast, unspecified quadrant: Secondary | ICD-10-CM | POA: Insufficient documentation

## 2013-11-23 MED ORDER — CYANOCOBALAMIN 1000 MCG/ML IJ SOLN
1000.0000 ug | Freq: Once | INTRAMUSCULAR | Status: AC
  Administered 2013-11-23: 1000 ug via INTRAMUSCULAR

## 2013-11-23 NOTE — Progress Notes (Signed)
Pre visit review using our clinic review tool, if applicable. No additional management support is needed unless otherwise documented below in the visit note. 

## 2013-11-23 NOTE — Progress Notes (Signed)
   Subjective:    Patient ID: Robyn FurbishJessica Nogueras, female    DOB: 11/11/1975, 38 y.o.   MRN: [id number]  HPI Comments: She returns today and tells me that she feels like there is a lump in her left breast that she is concerned about.     Review of Systems     Objective:   Physical Exam  Vitals reviewed. Constitutional: She is oriented to person, place, and time. She appears well-developed and well-nourished. No distress.  HENT:  Head: Normocephalic and atraumatic.  Mouth/Throat: Oropharynx is clear and moist. No oropharyngeal exudate.  Eyes: Conjunctivae are normal. Right eye exhibits no discharge. Left eye exhibits no discharge. No scleral icterus.  Neck: Normal range of motion. Neck supple. No JVD present. No tracheal deviation present. No thyromegaly present.  Cardiovascular: Normal rate, regular rhythm, normal heart sounds and intact distal pulses.  Exam reveals no gallop and no friction rub.   No murmur heard. Pulmonary/Chest: Effort normal and breath sounds normal. No stridor. No respiratory distress. She has no wheezes. She has no rales. Chest wall is not dull to percussion. She exhibits mass. She exhibits no tenderness, no bony tenderness, no laceration, no crepitus, no edema, no deformity, no swelling and no retraction. Right breast exhibits no inverted nipple, no mass, no nipple discharge, no skin change and no tenderness. Left breast exhibits mass. Left breast exhibits no inverted nipple, no nipple discharge, no skin change and no tenderness. Breasts are symmetrical.    Abdominal: Soft. Bowel sounds are normal. She exhibits no distension and no mass. There is no tenderness. There is no rebound and no guarding.  Musculoskeletal: Normal range of motion. She exhibits no edema and no tenderness.  Lymphadenopathy:    She has no cervical adenopathy.  Neurological: She is oriented to person, place, and time.  Skin: Skin is warm and dry. No rash noted. She is not diaphoretic. No  erythema. No pallor.  Psychiatric: She has a normal mood and affect. Her behavior is normal. Judgment and thought content normal.     Lab Results  Component Value Date   WBC 11.3* 09/17/2013   HGB 14.4 09/17/2013   HCT 43.0 09/17/2013   PLT 346 09/17/2013   GLUCOSE 100* 09/17/2013   CHOL 157 03/28/2013   TRIG 109.0 03/28/2013   HDL 37.70* 03/28/2013   LDLCALC 98 03/28/2013   ALT 73* 09/17/2013   AST 27 09/17/2013   NA 140 09/17/2013   K 3.7 09/17/2013   CL 99 09/17/2013   CREATININE 0.70 09/17/2013   BUN 12 09/17/2013   CO2 22 09/17/2013   TSH 2.14 03/28/2013   HGBA1C 5.9 03/28/2013       Assessment & Plan:

## 2013-11-23 NOTE — Patient Instructions (Signed)
Breast Cyst  A breast cyst is a sac in the breast that is filled with fluid. Breast cysts are common in women. Women can have one or many cysts. When the breasts contain many cysts, it is usually due to a noncancerous (benign) condition called fibrocystic change. These lumps form under the influence of female hormones (estrogen and progesterone). The lumps are most often located in the upper, outer portion of the breast. They are often more swollen, painful, and tender before your period starts. They usually disappear after menopause, unless you are on hormone therapy.   There are several types of cysts:  · Macrocyst. This is a cyst that is about 2 in. (5.1 cm) in diameter.    · Microcyst. This is a tiny cyst that you cannot feel but can be seen with a mammogram or an ultrasound.    · Galactocele. This is a cyst containing milk that may develop if you suddenly stop breastfeeding.    · Sebaceous cyst of the skin. This type of cyst is not in the breast tissue itself.  Breast cysts do not increase your risk of breast cancer. However, they must be monitored closely because they can be cancerous.   CAUSES   It is not known exactly what causes a breast cyst to form. Possible causes include:   · An overgrowth of milk glands and connective tissue in the breast can block the milk glands, causing them to fill with fluid.    · Scar tissue in the breast from previous surgery may block the glands, causing a cyst.    RISK FACTORS  Estrogen may influence the development of a breast cyst.    SIGNS AND SYMPTOMS   · Feeling a smooth, round, soft lump (like a grape) in the breast that is easily moveable.    · Breast discomfort or pain.  · Increase in size of the lump before your menstrual period and decrease in its size after your menstrual period.    DIAGNOSIS   A cyst can be felt during a physical exam by your health care provider. A breast X-ray exam (mammogram) and ultrasonography will be done to confirm the diagnosis. Fluid may  be removed from the cyst with a needle (fine needle aspiration) to make sure the cyst is not cancerous.    TREATMENT   Treatment may not be necessary. Your health care provider may monitor the cyst to see if it goes away on its own. If treatment is needed, it may include:  · Hormone treatment.    · Needle aspiration. There is a chance of the cyst coming back after aspiration.    · Surgery to remove the whole cyst.    HOME CARE INSTRUCTIONS   · Keep all follow-up appointments with your health care provider.  · See your health care provider regularly:  · Get a yearly exam by your health care provider.  · Have a clinical breast exam by a health care provider every 1 3 years if you are 20 37 years of age. After age 40 years, you should have the exam every year.    · Get mammogram tests as directed by your health care provider.    · Understand the normal appearance and feel of your breasts and perform breast self-exams.    · Only take over-the-counter or prescription medicines as directed by your health care provider.    · Wear a supportive bra, especially when exercising.    · Avoid caffeine.    · Reduce your salt intake, especially before your menstrual period. Too much salt can cause fluid retention, breast   swelling, and discomfort.    SEEK MEDICAL CARE IF:   · You feel, or think you feel, a lump in your breast.    · You notice that both breasts look or feel different than usual.    · Your breast is still causing pain after your menstrual period is over.    · You need medicine for breast pain and swelling that occurs with your menstrual period.    SEEK IMMEDIATE MEDICAL CARE IF:   · You have severe pain, tenderness, redness, or warmth in your breast.    · You have nipple discharge or bleeding.    · Your breast lump becomes hard and painful.    · You find new lumps or bumps that were not there before.    · You feel lumps in your armpit (axilla).    · You notice dimpling or wrinkling of the breast or nipple.    · You  have a fever.    MAKE SURE YOU:  · Understand these instructions.  · Will watch your condition.  · Will get help right away if you are not doing well or get worse.  Document Released: 07/26/2005 Document Revised: 03/28/2013 Document Reviewed: 02/22/2013  Exit[employer]® Patient Information ©2014 [employer].

## 2013-11-24 NOTE — Assessment & Plan Note (Signed)
Will schedule U/S and mammogram

## 2013-11-26 ENCOUNTER — Ambulatory Visit
Admission: RE | Admit: 2013-11-26 | Discharge: 2013-11-26 | Disposition: A | Discharge status: Home / Self Care | Payer: BC Managed Care – PPO | Source: Ambulatory Visit | Attending: Internal Medicine | Admitting: Internal Medicine

## 2013-11-26 ENCOUNTER — Other Ambulatory Visit: Payer: Self-pay | Admitting: Internal Medicine

## 2013-11-26 DIAGNOSIS — N6320 Unspecified lump in the left breast, unspecified quadrant: Secondary | ICD-10-CM

## 2013-11-28 ENCOUNTER — Ambulatory Visit
Admission: RE | Admit: 2013-11-28 | Discharge: 2013-11-28 | Disposition: A | Discharge status: Home / Self Care | Payer: BC Managed Care – PPO | Source: Ambulatory Visit | Attending: Internal Medicine | Admitting: Internal Medicine

## 2013-11-28 ENCOUNTER — Other Ambulatory Visit: Payer: Self-pay | Admitting: Internal Medicine

## 2013-11-28 DIAGNOSIS — N6320 Unspecified lump in the left breast, unspecified quadrant: Secondary | ICD-10-CM

## 2013-12-05 ENCOUNTER — Ambulatory Visit: Payer: BC Managed Care – PPO | Admitting: Dietician

## 2013-12-20 ENCOUNTER — Ambulatory Visit (INDEPENDENT_AMBULATORY_CARE_PROVIDER_SITE_OTHER): Payer: BC Managed Care – PPO | Admitting: Internal Medicine

## 2013-12-20 ENCOUNTER — Encounter: Payer: Self-pay | Admitting: Internal Medicine

## 2013-12-20 ENCOUNTER — Other Ambulatory Visit (INDEPENDENT_AMBULATORY_CARE_PROVIDER_SITE_OTHER): Payer: BC Managed Care – PPO

## 2013-12-20 VITALS — BP 104/64 | HR 60 | Temp 98.7°F | Resp 16 | Ht 64.0 in | Wt 203.0 lb

## 2013-12-20 DIAGNOSIS — E538 Deficiency of other specified B group vitamins: Secondary | ICD-10-CM

## 2013-12-20 DIAGNOSIS — Z9884 Bariatric surgery status: Secondary | ICD-10-CM

## 2013-12-20 LAB — CBC WITH DIFFERENTIAL/PLATELET
BASOS PCT: 0.6 % (ref 0.0–3.0)
Basophils Absolute: 0 10*3/uL (ref 0.0–0.1)
EOS PCT: 3.8 % (ref 0.0–5.0)
Eosinophils Absolute: 0.2 10*3/uL (ref 0.0–0.7)
HEMATOCRIT: 41 % (ref 36.0–46.0)
HEMOGLOBIN: 13.6 g/dL (ref 12.0–15.0)
LYMPHS PCT: 42.4 % (ref 12.0–46.0)
Lymphs Abs: 2.1 10*3/uL (ref 0.7–4.0)
MCHC: 33.1 g/dL (ref 30.0–36.0)
MCV: 86 fl (ref 78.0–100.0)
MONOS PCT: 7.8 % (ref 3.0–12.0)
Monocytes Absolute: 0.4 10*3/uL (ref 0.1–1.0)
NEUTROS ABS: 2.3 10*3/uL (ref 1.4–7.7)
Neutrophils Relative %: 45.4 % (ref 43.0–77.0)
Platelets: 316 10*3/uL (ref 150.0–400.0)
RBC: 4.77 Mil/uL (ref 3.87–5.11)
RDW: 14.6 % (ref 11.5–15.5)
WBC: 5 10*3/uL (ref 4.0–10.5)

## 2013-12-20 LAB — IBC PANEL
IRON: 74 ug/dL (ref 42–145)
SATURATION RATIOS: 22.9 % (ref 20.0–50.0)
Transferrin: 230.4 mg/dL (ref 212.0–360.0)

## 2013-12-20 LAB — FOLATE: FOLATE: 6.5 ng/mL (ref >5.9)

## 2013-12-20 LAB — FERRITIN: Ferritin: 66.2 ng/mL (ref 10.0–291.0)

## 2013-12-20 LAB — TSH: TSH: 0.92 u[IU]/mL (ref 0.35–4.50)

## 2013-12-20 LAB — VITAMIN B12: Vitamin B-12: >1500 pg/mL — ABNORMAL HIGH (ref 211–911)

## 2013-12-20 MED ORDER — CYANOCOBALAMIN 1000 MCG/ML IJ SOLN
1000.0000 ug | Freq: Once | INTRAMUSCULAR | Status: AC
  Administered 2013-12-20: 1000 ug via INTRAMUSCULAR

## 2013-12-20 NOTE — Progress Notes (Signed)
Pre visit review using our clinic review tool, if applicable. No additional management support is needed unless otherwise documented below in the visit note. 

## 2013-12-20 NOTE — Patient Instructions (Signed)

## 2013-12-20 NOTE — Progress Notes (Signed)
Subjective:    Patient ID: Robyn Bryant, female    DOB: 03/17/1976, 38 y.o.   MRN: [id number]  Anemia Presents for follow-up visit. Symptoms include malaise/fatigue. There has been no abdominal pain, anorexia, bruising/bleeding easily, confusion, fever, leg swelling, light-headedness, pallor, palpitations, paresthesias, pica or weight loss. (She complains of hair thinning and wants her vitamin levels checked.) Signs of blood loss that are not present include hematemesis, hematochezia, melena, menorrhagia and vaginal bleeding. Past treatments include oral iron supplements and parenteral vitamin B12. Past medical history includes malabsorption and recent surgery. There are no compliance problems.       Review of Systems  Constitutional: Positive for malaise/fatigue and fatigue. Negative for fever, chills, weight loss, diaphoresis, activity change, appetite change and unexpected weight change.  HENT: Negative.   Eyes: Negative.   Respiratory: Negative.  Negative for cough, choking, chest tightness, shortness of breath, wheezing and stridor.   Cardiovascular: Negative.  Negative for chest pain, palpitations and leg swelling.  Gastrointestinal: Negative.  Negative for nausea, vomiting, abdominal pain, diarrhea, constipation, blood in stool, melena, hematochezia, anorexia and hematemesis.  Endocrine: Negative.   Genitourinary: Negative.  Negative for vaginal bleeding and menorrhagia.  Musculoskeletal: Negative.  Negative for arthralgias, back pain, gait problem, joint swelling, myalgias, neck pain and neck stiffness.  Skin: Negative for color change, pallor, rash and wound.  Allergic/Immunologic: Negative.   Neurological: Negative.  Negative for dizziness, tremors, speech difficulty, weakness, light-headedness, numbness, headaches and paresthesias.  Hematological: Negative.  Negative for adenopathy. Does not bruise/bleed easily.  Psychiatric/Behavioral: Negative.  Negative for confusion.        Objective:   Physical Exam  Vitals reviewed. Constitutional: She is oriented to person, place, and time. She appears well-developed and well-nourished. No distress.  HENT:  Head: Normocephalic and atraumatic.  Mouth/Throat: Oropharynx is clear and moist. No oropharyngeal exudate.  Eyes: Conjunctivae are normal. Right eye exhibits no discharge. Left eye exhibits no discharge. No scleral icterus.  Neck: Normal range of motion. Neck supple. No JVD present. No tracheal deviation present. No thyromegaly present.  Cardiovascular: Normal rate, regular rhythm, normal heart sounds and intact distal pulses.  Exam reveals no gallop and no friction rub.   No murmur heard. Pulmonary/Chest: Effort normal and breath sounds normal. No stridor. No respiratory distress. She has no wheezes. She has no rales. She exhibits no tenderness.  Abdominal: Soft. Bowel sounds are normal. She exhibits no distension and no mass. There is no tenderness. There is no rebound and no guarding.  Musculoskeletal: Normal range of motion. She exhibits no edema and no tenderness.  Lymphadenopathy:    She has no cervical adenopathy.  Neurological: She is oriented to person, place, and time.  Skin: Skin is warm and dry. No rash noted. She is not diaphoretic. No erythema. No pallor.  Her scalp hair appears normal to me, no alopecia noted.  Psychiatric: She has a normal mood and affect. Her behavior is normal. Judgment and thought content normal.     Lab Results  Component Value Date   WBC 11.3* 09/17/2013   HGB 14.4 09/17/2013   HCT 43.0 09/17/2013   PLT 346 09/17/2013   GLUCOSE 100* 09/17/2013   CHOL 157 03/28/2013   TRIG 109.0 03/28/2013   HDL 37.70* 03/28/2013   LDLCALC 98 03/28/2013   ALT 73* 09/17/2013   AST 27 09/17/2013   NA 140 09/17/2013   K 3.7 09/17/2013   CL 99 09/17/2013   CREATININE 0.70 09/17/2013   BUN 12 09/17/2013  CO2 22 09/17/2013   TSH 2.14 03/28/2013   HGBA1C 5.9 03/28/2013       Assessment & Plan:

## 2013-12-21 ENCOUNTER — Encounter: Payer: Self-pay | Admitting: Internal Medicine

## 2013-12-21 LAB — VITAMIN D 25 HYDROXY (VIT D DEFICIENCY, FRACTURES): VIT D 25 HYDROXY: 32 ng/mL (ref 30–89)

## 2013-12-21 NOTE — Assessment & Plan Note (Signed)
I will recheck her CBC and her B12 level today 

## 2013-12-21 NOTE — Assessment & Plan Note (Signed)
I will recheck her CBC and will look at her vitamin levels as well She will cont taking oral vitamin supplements

## 2013-12-24 ENCOUNTER — Other Ambulatory Visit: Payer: Self-pay | Admitting: Internal Medicine

## 2013-12-24 ENCOUNTER — Encounter: Payer: Self-pay | Admitting: Internal Medicine

## 2013-12-24 DIAGNOSIS — E519 Thiamine deficiency, unspecified: Secondary | ICD-10-CM | POA: Insufficient documentation

## 2013-12-24 LAB — VITAMIN B6: Vitamin B6: 3.2 ng/mL (ref 2.1–21.7)

## 2013-12-24 LAB — VITAMIN B1: Vitamin B1 (Thiamine): <7 nmol/L — ABNORMAL LOW (ref 8–30)

## 2013-12-24 MED ORDER — VITAMIN B-1 250 MG PO TABS: 250.0000 mg | ORAL_TABLET | Freq: Every day | ORAL | Status: DC

## 2013-12-26 ENCOUNTER — Telehealth: Payer: Self-pay

## 2013-12-26 DIAGNOSIS — Z9884 Bariatric surgery status: Secondary | ICD-10-CM

## 2013-12-26 DIAGNOSIS — E519 Thiamine deficiency, unspecified: Secondary | ICD-10-CM

## 2013-12-26 MED ORDER — VITAMIN B-1 100 MG PO TABS: 100.0000 mg | ORAL_TABLET | Freq: Every day | ORAL | Status: DC

## 2013-12-26 NOTE — Telephone Encounter (Signed)
Received fax from pharmacy stating that vitamin b-1 250 has been discontinued by mfg, please change. Thanks

## 2014-01-15 ENCOUNTER — Telehealth (INDEPENDENT_AMBULATORY_CARE_PROVIDER_SITE_OTHER): Payer: Self-pay

## 2014-01-15 DIAGNOSIS — R109 Unspecified abdominal pain: Secondary | ICD-10-CM

## 2014-01-15 NOTE — Telephone Encounter (Signed)
Paged Dr. Johna Sheriff- patient need's to have CBC & CMET drawn today and schedule office appointment for 01/16/14 w/Dr. Johna Sheriff.  Lab orders are in [facility]

## 2014-01-15 NOTE — Telephone Encounter (Signed)
Message copied by Maryan Puls on Tue Jan 15, 2014  1:12 PM ------      Message from: Kerrin Champagne      Created: Tue Jan 15, 2014 10:52 AM      Contact: [phone]       Please call pt she had sx 6 months ago she is having problems and I do not have anything soon. Please call pt ------

## 2014-01-15 NOTE — Addendum Note (Signed)
Addended by: Maryan Puls on: 01/15/2014 03:44 PM   Modules accepted: Orders

## 2014-01-16 ENCOUNTER — Encounter (INDEPENDENT_AMBULATORY_CARE_PROVIDER_SITE_OTHER): Payer: Self-pay | Admitting: General Surgery

## 2014-01-16 ENCOUNTER — Ambulatory Visit (INDEPENDENT_AMBULATORY_CARE_PROVIDER_SITE_OTHER): Payer: BC Managed Care – PPO | Admitting: General Surgery

## 2014-01-16 VITALS — BP 118/80 | HR 53 | Temp 97.5°F | Ht 64.0 in | Wt 196.0 lb

## 2014-01-16 DIAGNOSIS — R109 Unspecified abdominal pain: Secondary | ICD-10-CM

## 2014-01-16 LAB — CBC WITH DIFFERENTIAL/PLATELET
Basophils Absolute: 0 10*3/uL (ref 0.0–0.1)
Eosinophils Absolute: 0 10*3/uL (ref 0.0–0.7)
HEMATOCRIT: 41.7 % (ref 36.0–46.0)
Hemoglobin: 13.6 g/dL (ref 12.0–15.0)
LYMPHS PCT: 42 % (ref 12–46)
Lymphs Abs: 2.6 10*3/uL (ref 0.7–4.0)
MCH: 28.3 pg (ref 26.0–34.0)
MCHC: 32.6 g/dL (ref 30.0–36.0)
MCV: 86.6 fL (ref 78.0–100.0)
Monocytes Absolute: 0.2 10*3/uL (ref 0.1–1.0)
Monocytes Relative: 4 % (ref 3–12)
Neutro Abs: 3.3 10*3/uL (ref 1.7–7.7)
Neutrophils Relative %: 54 % (ref 43–77)
Platelets: 292 10*3/uL (ref 150–400)
RBC: 4.81 MIL/uL (ref 3.87–5.11)
RDW: 13.3 % (ref 11.5–15.5)
WBC: 6.2 10*3/uL (ref 4.0–10.5)

## 2014-01-16 LAB — COMPREHENSIVE METABOLIC PANEL
ALT: 25 U/L (ref 0–35)
AST: 20 U/L (ref 0–37)
Albumin: 3.5 g/dL (ref 3.5–5.2)
Alkaline Phosphatase: 72 U/L (ref 39–117)
BILIRUBIN TOTAL: 1.2 mg/dL (ref 0.2–1.2)
BUN: 5 mg/dL — ABNORMAL LOW (ref 6–23)
CO2: 25 mEq/L (ref 19–32)
Calcium: 9.2 mg/dL (ref 8.4–10.5)
Chloride: 104 mEq/L (ref 96–112)
Creat: 0.7 mg/dL (ref 0.50–1.10)
GLUCOSE: 77 mg/dL (ref 70–99)
Potassium: 3.6 mEq/L (ref 3.5–5.3)
SODIUM: 143 meq/L (ref 135–145)
TOTAL PROTEIN: 6.3 g/dL (ref 6.0–8.3)

## 2014-01-16 MED ORDER — SUCRALFATE 1 GM/10ML PO SUSP: 1.0000 g | Freq: Three times a day (TID) | ORAL | Status: DC

## 2014-01-16 NOTE — Patient Instructions (Signed)
Increase protonix to twice daily for the next 2 weeks. Call us in 2 weeks to let us know how you're doing and we will make plans at that time.

## 2014-01-16 NOTE — Progress Notes (Signed)
Chief complaint: Followup sleeve gastrectomy, recurrent abdominal pain  History: The patient returns for followup almost 6 months following laparoscopic sleeve gastrectomy for morbid obesity. She did very well in the early postoperative period and then several weeks later developed persistent vomiting and epigastric and right upper quadrant abdominal pain. She had a thorough workup with no cause found other than sludge in her gallbladder. She underwent laparoscopic cholecystectomy. No problems were identified with her sleeve. She improved but continued to have episodic pain and nausea 4 weeks afterwards. She was last seen 3 months postoperatively at which time she was doing quite a bit better, denying any pain or nausea and tolerating her regular diet. She states that 4 days ago she had fairly sudden onset of diarrhea that lasted about 48 hours. At the same time she began to get burning discomfort in her chest and upper abdomen with any solid food or fluid intake. She has not been eating due to the discomfort. No vomiting. No regurgitation. She remains on protonic 40 mg a day. She also complains of significant ongoing fatigue. She has been followed closely by Dr. Yetta Barre and is on B vitamin supplements and has had levels checked. On questioning she has been sleeping about 4 hours per night as she has trouble going to sleep and working 12 hour days.  Exam: BP 118/80  Pulse 53  Temp(Src) 97.5 F (36.4 C)  Ht 5\' 4"  (1.626 m)  Wt 196 lb (88.905 kg)  BMI 33.63 kg/m2  LMP 12/14/2013 Total weight loss 53 pounds, 14 pounds since last visit General: Appears well Abdomen: mild epigastric tenderness without guarding. No distention or masses.  Lab Results  Component Value Date   WBC 6.2 01/16/2014   HGB 13.6 01/16/2014   HCT 41.7 01/16/2014   MCV 86.6 01/16/2014   PLT 292 01/16/2014   Lab Results  Component Value Date   CREATININE 0.70 01/15/2014   BUN 5* 01/15/2014   NA 143 01/15/2014   K 3.6 01/15/2014   CL  104 01/15/2014   CO2 25 01/15/2014     Assessment and plan: recurrent abdominal pain following sleeve gastrectomy as above. This appeared to start as an acute illness with diarrhea and I wonder if it could have been a viral illness or food related illness which started this off. She seems to be having dyspeptic or reflux type of symptoms. No vomiting. No evidence of infection or dehydration. For now I recommended increasing her protonic twice daily for 2 weeks and I wrote her a prescription for Carafate to use for 2 weeks. If she is feeling any worse she will call me. We will touch base by phone in 2 weeks to see how she is doing. If her symptoms continue then I might consider upper endoscopy. If they improve with this management we will plan her 9 month followup in 3 months.

## 2014-01-17 ENCOUNTER — Telehealth: Payer: Self-pay

## 2014-01-17 DIAGNOSIS — G4700 Insomnia, unspecified: Secondary | ICD-10-CM

## 2014-01-17 NOTE — Telephone Encounter (Signed)
???   Help with fatigue or help with insomnia ???

## 2014-01-17 NOTE — Telephone Encounter (Signed)
Pt states that she is still experiencing fatigue.  She has been taking the supplement that you told her to take, but her sx are unchanged, and she is not able to sleep more than 4 hours per night. Pt would like to know if there is anything that she can do to help. Please advise

## 2014-01-17 NOTE — Telephone Encounter (Signed)
both

## 2014-01-18 ENCOUNTER — Ambulatory Visit (INDEPENDENT_AMBULATORY_CARE_PROVIDER_SITE_OTHER): Payer: BC Managed Care – PPO | Admitting: Internal Medicine

## 2014-01-18 ENCOUNTER — Encounter: Payer: Self-pay | Admitting: Internal Medicine

## 2014-01-18 ENCOUNTER — Telehealth (INDEPENDENT_AMBULATORY_CARE_PROVIDER_SITE_OTHER): Payer: Self-pay | Admitting: General Surgery

## 2014-01-18 ENCOUNTER — Encounter (INDEPENDENT_AMBULATORY_CARE_PROVIDER_SITE_OTHER): Payer: Self-pay | Admitting: General Surgery

## 2014-01-18 VITALS — BP 96/70 | HR 66 | Temp 98.2°F | Wt 195.6 lb

## 2014-01-18 DIAGNOSIS — G4700 Insomnia, unspecified: Secondary | ICD-10-CM | POA: Insufficient documentation

## 2014-01-18 DIAGNOSIS — E538 Deficiency of other specified B group vitamins: Secondary | ICD-10-CM

## 2014-01-18 DIAGNOSIS — R197 Diarrhea, unspecified: Secondary | ICD-10-CM

## 2014-01-18 DIAGNOSIS — Z9884 Bariatric surgery status: Secondary | ICD-10-CM

## 2014-01-18 MED ORDER — SUVOREXANT 20 MG PO TABS: 1.0000 | ORAL_TABLET | Freq: Every evening | ORAL | Status: DC | PRN

## 2014-01-18 MED ORDER — CYANOCOBALAMIN 1000 MCG/ML IJ SOLN
1000.0000 ug | Freq: Once | INTRAMUSCULAR | Status: AC
  Administered 2014-01-18: 1000 ug via INTRAMUSCULAR

## 2014-01-18 NOTE — Telephone Encounter (Signed)
Pt called to report she is still having constant diarrhea; out of work today because of diarrhea.  Wants to know what she can take to help slow down the diarrhea.  Please advise.

## 2014-01-18 NOTE — Telephone Encounter (Signed)
I think we should treat the insomnia first - ask her to come pick the prescription and a free trial offer card

## 2014-01-18 NOTE — Progress Notes (Signed)
   Subjective:    Patient ID: Robyn Bryant, female    DOB: 04/03/1976, 38 y.o.   MRN: [id number]  HPI Pt presents with an onset of diarrhea that began 01/13/14. The diarrhea first started while the pt was at home. She had eaten chicken for dinner and about an hour later had her first bout of diarrhea. The diarrhea is profuse &  watery. The diarrhea has worsened in frequency since 01/13/14, from having 2-3 bouts of diarrhea to currently having 5-6 bouts per day. The diarrhea occurs within 5 minutes of consuming any type of food. There is no blood in the stool nor tarry stools that pt has noticed. 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 She [facility] 2 weeks prior to onset of symptoms. 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 She has not tried immodium nor any other treatment modalities. The diarrhea has caused the pt to stay home from work as she feels she can't leave the house.   The pt is having intermittent nausea and intermittent sharp abdominal pain in her epigastric region. There has been no vomiting. These symptoms began 1 day after the presentation of diarrhea.   Of significance the pt had gastric sleeve surgery on 08/08/13. She saw her GI surgeon on Wednesday 01/16/14 for these symptoms at which point he drew labs. The comprehensive metabolic profile and CBC and differential were reviewed ;both normal. Copy given to her. She has been having increased problems with GERD, so her GI surgeon increased her protonix to BID and started her back on carafate. The diarrhea preceded the initiation of the Carafate   Review of Systems Constitutional: Positive for appetite change and fatigue. Negative for fever.  Night sweats x last 3 nights  HENT: Negative for trouble swallowing.  Cardiovascular: Negative for chest pain.  Gastrointestinal: Negative for  vomiting.  Genitourinary: Negative for dysuria and frequency.  Neurological: Negative for dizziness and light-headedness.      Objective:   Physical Exam  Significant or distinguishing  findings on physical exam are documented first.  Below that are other systems examined & findings. Based on CDC criteria she still is obese despite having had the obesity surgery. The tongue is coated in a geographic pattern. She has an S4 without murmur or gallops. Bowel sounds are decreased.  General appearance:adequate nourishment ;w/o distress. Eyes: No conjunctival inflammation or scleral icterus is present. Oral exam: Dental hygiene is good; lips and gums are healthy appearing.There is no oropharyngeal erythema or exudate noted.  Heart:  Normal rate and regular rhythm. S1 and S2 normal without gallop, murmur, click, or rub   Lungs:Chest clear to auscultation; no wheezes, rhonchi,rales ,or rubs present.No increased work of breathing.  Abdomen: soft and non-tender without masses, organomegaly or hernias noted.  No guarding or rebound . No tenderness over the flanks to percussion Musculoskeletal: Able to lie flat and sit up without help. Negative straight leg raising bilaterally. Gait normal Skin:Warm & dry.  Intact without suspicious lesions or rashes ; no jaundice or tenting.She has scattered tattoos. Lymphatic: No lymphadenopathy is noted about the head, neck, axilla               Assessment & Plan:  #1 diarrhea; R/O Infectious etiology  based on possible exposures  See orders and recommendations

## 2014-01-18 NOTE — Telephone Encounter (Signed)
MyChart Message response to patient:     I spoke to Dr. Johna SheriffHoxworth regarding your email. Dr. Johna SheriffHoxworth does not feel that this is related to the Gastric Sleeve. It is recommended to be seen by your PCP or we can refer you to a Gastroenterologist for further work-up. I can provide a work note for 01/16/14 but, I am unable to provide a work note after that day. Please call your PCP to make an appointment for further work-up and if you would like I can arrange a Referral to Gastroenterology. Please let me know if you would like a referral.

## 2014-01-18 NOTE — Progress Notes (Signed)
Pre visit review using our clinic review tool, if applicable. No additional management support is needed unless otherwise documented below in the visit note. 

## 2014-01-18 NOTE — Telephone Encounter (Signed)
Pt notified, she will pick up rx today, she has an appt w/ dr. hopper

## 2014-01-18 NOTE — Progress Notes (Signed)
   Subjective:    Patient ID: Robyn FurbishJessica Bryant, female    DOB: 09/23/1975, [age] y.o.   MRN: [id number]  HPI Pt presents with an onset of diarrhea that began 01/13/14. The diarrhea first started while the pt was at home. She had eaten chicken for dinner and about an hour later had her first bout of diarrhea. The diarrhea is profusely watery. The diarrhea has worsened in frequency since 01/13/14, from having 2-3 bouts of diarrhea to currently having 5-6 bouts per day. The diarrhea occurs within 5 minutes of consuming any type of food. There is no blood in the stool nor tarry stools that pt has noticed. 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 She has not tried immodium nor any other treatment modalities. The diarrhea has caused the pt to stay home from work as she feels she can't leave the house.   The pt is having intermittent nausea and intermittent sharp abdominal pain in her epigastric region. There has been no vomiting. These symptoms began 1 day after the presentation of diarrhea.   Of significance the pt had gastric sleeve surgery on 08/08/13. She saw her GI surgeon on Wednesday 01/16/14 for these symptoms at which point he drew labs. She has been having increased problems with GERD, so her GI surgeon increased her protonix to BID and started her back on carafate.    Review of Systems  Constitutional: Positive for appetite change and fatigue. Negative for fever.       Night sweats x last 3 nights   HENT: Negative for trouble swallowing.   Cardiovascular: Negative for chest pain.  Gastrointestinal: Negative for vomiting.  Genitourinary: Negative for dysuria and frequency.  Neurological: Negative for dizziness and light-headedness.       Objective:   Physical Exam        Assessment & Plan:  #1 gastroenteritis; most likely viral in nature. Will continue supportive therapies. Inform pt of s/sx of dehydration and indications for ER visit.

## 2014-01-18 NOTE — Patient Instructions (Addendum)
   Stay on clear liquids for 48-72 hours or until bowels are normal.This would include  jello, sherbert (NOT ice cream), Lipton's chicken noodle soup(NOT cream based soups),Gatorade Lite, flat Ginger ale (without High Fructose Corn Syrup),dry toast or crackers, baked potato.No milk , dairy or grease until bowels are formed. Florastor , a [employer] Biotic , daily .This will replace the normal bacteria which  are necessary for formation of normal stool and processing of food. Immodium AD for frankly watery stool. Report increasing pain, fever or rectal bleeding

## 2014-02-05 ENCOUNTER — Encounter: Payer: BC Managed Care – PPO | Attending: General Surgery | Admitting: Dietician

## 2014-02-05 VITALS — Ht 64.0 in | Wt 190.5 lb

## 2014-02-05 DIAGNOSIS — E669 Obesity, unspecified: Secondary | ICD-10-CM

## 2014-02-05 DIAGNOSIS — Z713 Dietary counseling and surveillance: Secondary | ICD-10-CM | POA: Insufficient documentation

## 2014-02-05 NOTE — Progress Notes (Signed)
  Follow-up visit:  6 months Post-Operative Gastric Sleeve Surgery  Medical Nutrition Therapy:  Appt start time: 1941 end time: 1045   Primary concerns today:  Robyn Bryant returns today having lost 22 pounds since her last visit. She saw Dr. Excell Seltzer a few weeks ago for persistent diarrhea; the diarrhea has since been resolved and was likely related to a GI virus. Robyn Bryant reports that she cannot tolerate eggs, pork or beef. She is not having any vomiting but does have acid reflux. She states that she is happy at her current weight but her overall goal is 165 lbs.  Surgery date: 08/08/2013 Surgery type: Sleeve Starting weight at [facility]: 254 lbs on 11/14  Weight today: 190.5 lbs Weight change: 22 lbs Total weight lost: 63.5 lbs  Goal: 165 lbs by December  TANITA  BODY COMP RESULTS  08/21/13 10/10/13 11/13/13 02/05/14   BMI (kg/m^2) 41.2 38.4 36.5 32.7   Fat Mass (lbs) 130.5 109.0 105 86.5   Fat Free Mass (lbs) 109.5 114.5 107.5 104   Total Body Water (lbs) 80.0 84.0 78.5 76    Preferred Learning Style:   No preference indicated   Learning Readiness:   Ready  24-hr recall: *Nothing before 10am (will get sick) B (10:30AM): bowl of cereal with 1% milk or banana (0-6g) Snk (AM): 3-4 slices of [place] with cheese (28g)  L (PM):  Broccoli cheddar soup from [employer]  Snk (PM): snacks on cheese all day, unable to estimate amount D (PM): mashed potatoes with 2-3 oz of grilled/baked chicken or shrimp (14-21g) Snk (PM): none  Fluid intake: water, watered-down sweet tea and Cran-grape juice (64+ oz per patient estimate) Estimated total protein intake: unable to calculate  Medications: see list  Supplementation: Thiamin, B12 shot every month, MVI, not taking Calcium  Using straws: No Drinking while eating: No Hair loss: Yes, "coming out in clumps" Carbonated beverages: Yes, lets them go flat N/V/D/C: diarrhea with fried foods, acid reflux Dumping syndrome: No  Recent physical activity:  walks a lot at work  Progress Towards Goal(s):  In progress.    Nutritional Diagnosis:  [place]-3.3 Overweight/obesity related to past poor dietary habits and physical inactivity as evidenced by patient w/ recent sleeve gastrectomy surgery following dietary guidelines for continued weight loss.    Intervention:  Nutrition education/diet advancement. Discussed strategies to increase fluid intake, ensure protein goals are met, and minimize nausea and vomiting.   Teaching Method Utilized:  Visual Auditory Hands on  Barriers to learning/adherence to lifestyle change:acid reflux  Demonstrated degree of understanding via:  Teach Back   Monitoring/Evaluation:  Dietary intake, exercise, and body weight. Follow up in 3 months for 9 month post-op visit.

## 2014-02-05 NOTE — Patient Instructions (Addendum)
Goals:  Eat 3-6 small meals/snacks, every 3-5 hrs  Increase lean protein foods to meet 60g goal - low fat cheese, yogurt, seafood, deli meat   Aim for >30 min of physical activity daily  Take Protonix consistently  Look for a new Calcium citrate supplement    Cut out sweet tea and other sugary drinks    TANITA  BODY COMP RESULTS  08/21/13 10/10/13 11/13/13 02/05/14   BMI (kg/m^2) 41.2 38.4 36.5 32.7   Fat Mass (lbs) 130.5 109.0 105 86.5   Fat Free Mass (lbs) 109.5 114.5 107.5 104   Total Body Water (lbs) 80.0 84.0 78.5 76

## 2014-05-03 ENCOUNTER — Ambulatory Visit: Payer: BC Managed Care – PPO | Admitting: Dietician

## 2014-05-08 ENCOUNTER — Ambulatory Visit: Payer: BC Managed Care – PPO | Admitting: Dietician

## 2014-05-24 ENCOUNTER — Other Ambulatory Visit: Payer: Self-pay

## 2014-08-09 HISTORY — PX: CARPAL TUNNEL RELEASE: SHX101

## 2014-08-26 ENCOUNTER — Encounter: Payer: BC Managed Care – PPO | Attending: General Surgery | Admitting: Dietician

## 2014-08-26 VITALS — Ht 64.0 in | Wt 168.0 lb

## 2014-08-26 DIAGNOSIS — Z713 Dietary counseling and surveillance: Secondary | ICD-10-CM | POA: Insufficient documentation

## 2014-08-26 DIAGNOSIS — Z6828 Body mass index (BMI) 28.0-28.9, adult: Secondary | ICD-10-CM | POA: Insufficient documentation

## 2014-08-26 DIAGNOSIS — Z9884 Bariatric surgery status: Secondary | ICD-10-CM | POA: Insufficient documentation

## 2014-08-26 NOTE — Progress Notes (Signed)
  Follow-up visit:  12 months Post-Operative Gastric Sleeve Surgery  Medical Nutrition Therapy:  Appt start time: 1000 end time: 1015   Primary concerns today: Robyn Bryant returns today having lost 22.5 pounds since her last visit. Her weight is fluctuating a bit though she does not want to lose more weight. Feels like she can eat everything. "always hungry" since she is only eating a small amount. Not taking any medication except protonix before beef. Not taking supplements. She does not want take any since they make her nauseas. Doesn't usually skips meals though doesn't have a set schedule.   Surgery date: 08/08/2013 Surgery type: Sleeve [facility] lbs on 11/14  Weight today: 168.0 lbs Weight change: 22.5 lbs Total weight lost: 86 lbs Goal: 165 lbs by December  TANITA  BODY COMP RESULTS  08/21/13 10/10/13 11/13/13 02/05/14 08/26/14   BMI (kg/m^2) 41.2 38.4 36.5 32.7 28.8   Fat Mass (lbs) 130.5 109.0 105 86.5 68.5   Fat Free Mass (lbs) 109.5 114.5 107.5 104 99.5   Total Body Water (lbs) 80.0 84.0 78.5 76 73.0    Preferred Learning Style:   No preference indicated   Learning Readiness:   Ready  24-hr recall: *Nothing before 9am (will get sick) B (9-10:30AM): 2 eggs with cheese or Premier protein shake (15-30 g) Snk (AM): might have peanuts (6g)  L (PM):  Works on the road, likes to eat soup Snk (PM): snacks on cheese all day, unable to estimate amount D (PM): can of tuna or wrap or fish and gravy with rice (28g) Snk (PM): none  Fluid intake: water, Cran-grape juice ("nowhere near what she needs"), protein shake Estimated total protein intake: unable to calculate  Medications: see list  Supplementation: vitamins make her nauseas   Using straws: No Drinking while eating: No Hair loss: Yes - getting better Carbonated beverages: 1 sprite per week N/V/D/C: nausea if she eats beef without protonix, has some constipation (feels bloated) Dumping syndrome:  No  Recent physical activity: walks a lot at work  Progress Towards Goal(s):  In progress.    Nutritional Diagnosis:  [place]-3.3 Overweight/obesity related to past poor dietary habits and physical inactivity as evidenced by patient w/ recent sleeve gastrectomy surgery following dietary guidelines for continued weight loss.    Intervention:  Nutrition education/diet advancement. Discussed strategies to increase fluid intake. Strongly encouraged her to resume vitamin and calcium supplements though she refused.  Goals:  Eat 3-6 small meals/snacks, every 3-5 hrs  Increase lean protein foods to meet 60g goal - low fat cheese, yogurt, seafood, deli meat   Aim for >30 min of physical activity daily  Try to sip sugar free drinks more frequently   Have Dr. Johna SheriffHoxworth draw labs, consider restarting vitamins and calcium   Teaching Method Utilized:  Visual Auditory Hands on  Barriers to learning/adherence to lifestyle change: not thirsty, vitamins make her nauseas  Demonstrated degree of understanding via:  Teach Back   Monitoring/Evaluation:  Dietary intake, exercise, and body weight. Follow up in 1 year.

## 2014-08-26 NOTE — Patient Instructions (Addendum)
Goals:  Eat 3-6 small meals/snacks, every 3-5 hrs  Increase lean protein foods to meet 60g goal - low fat cheese, yogurt, seafood, deli meat   Aim for >30 min of physical activity daily  Try to sip sugar free drinks more frequently   Have Dr. Johna SheriffHoxworth draws labs, consider restarting vitamins and calcium  Surgery date: 08/08/2013 Surgery type: Sleeve [facility] on [facility]/14  Weight today: 168.0 lbs Weight change: 22.5 lbs Total weight lost: 86 lbs Goal: 165 lbs by December  TANITA  BODY COMP RESULTS  08/21/13 10/10/13 11/13/13 02/05/14 08/26/14   BMI (kg/m^2) 41.2 38.4 36.5 32.7 28.8   Fat Mass (lbs) 130.5 109.0 105 86.5 68.5   Fat Free Mass (lbs) 109.5 114.5 107.5 104 99.5   Total Body Water (lbs) 80.0 84.0 78.5 76 73.0

## 2014-10-01 IMAGING — CT CT ANGIO CHEST
1 of 2 series · 19 of 32 positions shown · IV contrast (OMNIPAQUE 300)
Comparison: Chest radiograph September 17, 2013.

CLINICAL DATA: Shortness of breath with ambulation. History of
gastric bypass surgery July 2013, cholecystectomy August 2013.

EXAM:
CT ANGIOGRAPHY CHEST WITH CONTRAST
TECHNIQUE: Multidetector CT imaging of the chest was performed using the
standard protocol during bolus administration of intravenous
contrast. Multiplanar CT image reconstructions and MIPs were
obtained to evaluate the vascular anatomy.
CONTRAST:  100mL OMNIPAQUE IOHEXOL 350 MG/ML SOLN

[Series 7: thins for pacs · axial · 0.58mm/px · z∈[+1582,+1772]mm · 19 of 212 slices shown]
[im 11/212  lung]
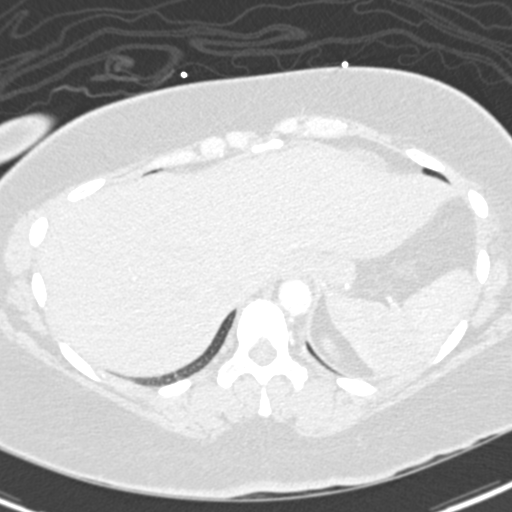
[im 22/212  mediastinal]
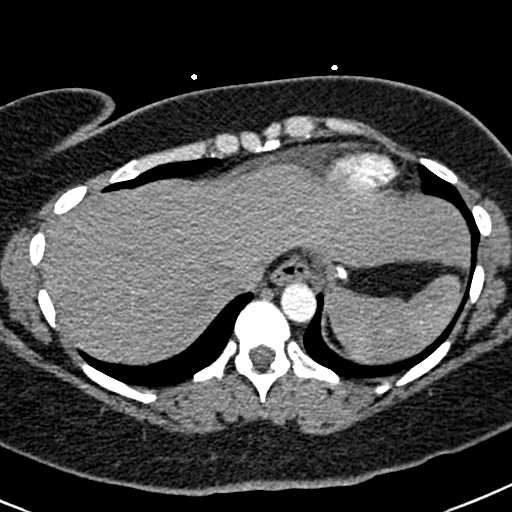
[im 32/212  lung]
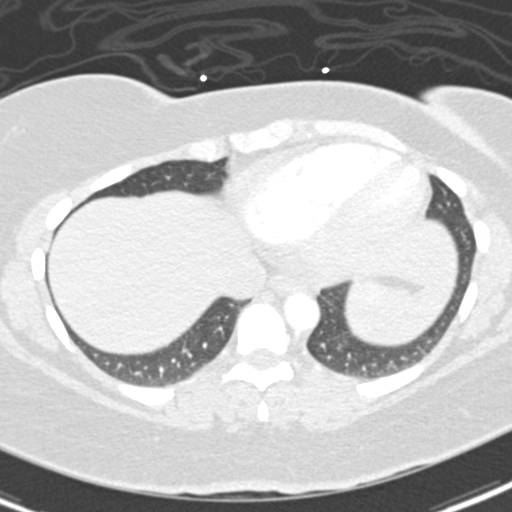
[im 53/212  mediastinal]
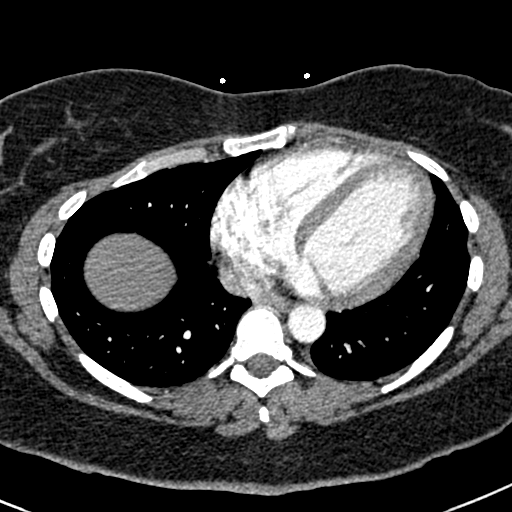
[im 64/212  lung]
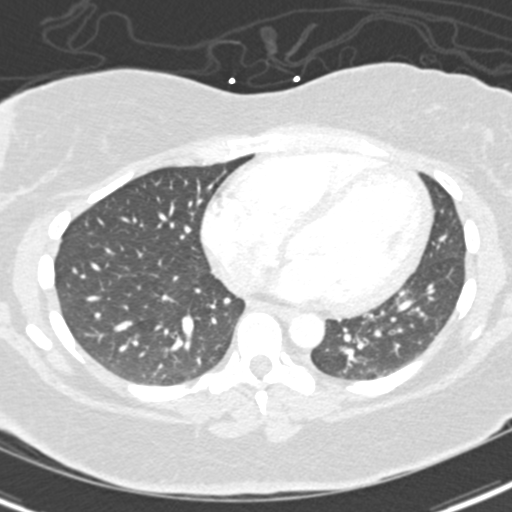
[im 71/212  mediastinal]
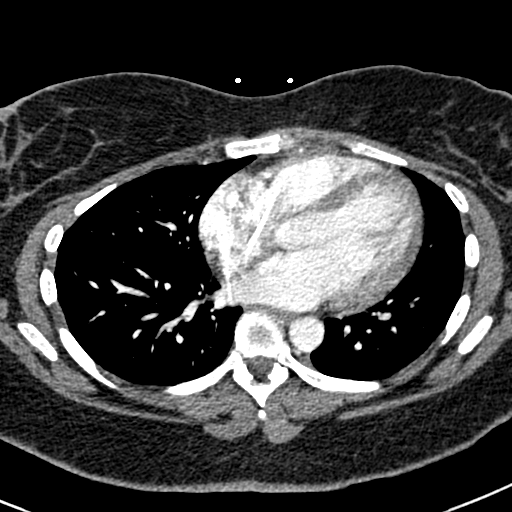
[im 74/212  lung]
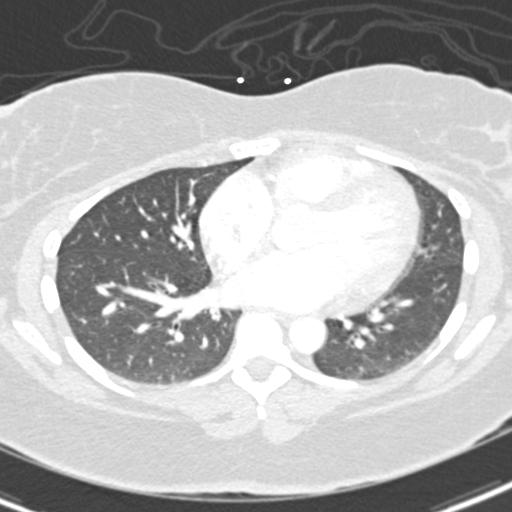
[im 85/212  mediastinal]
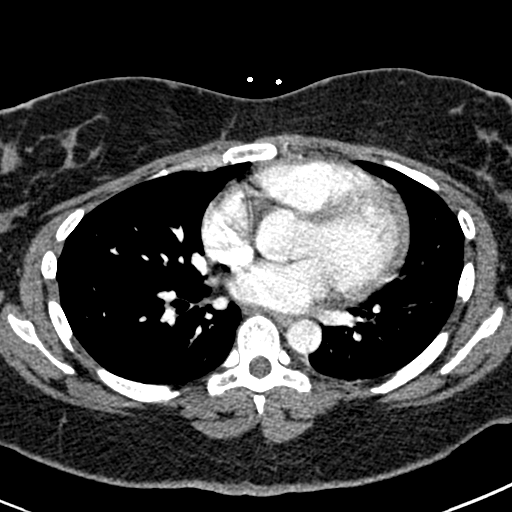
[im 95/212  lung]
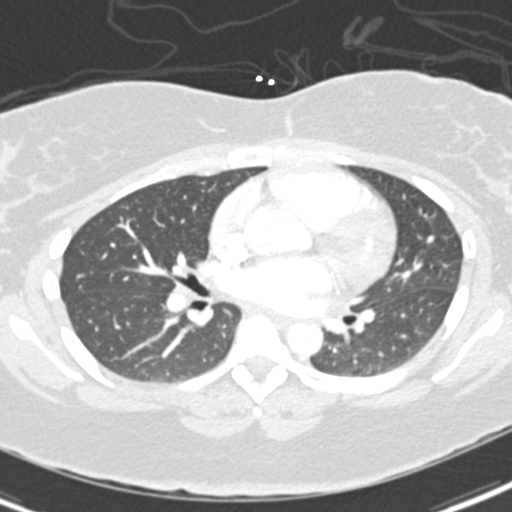
[im 106/212  mediastinal]
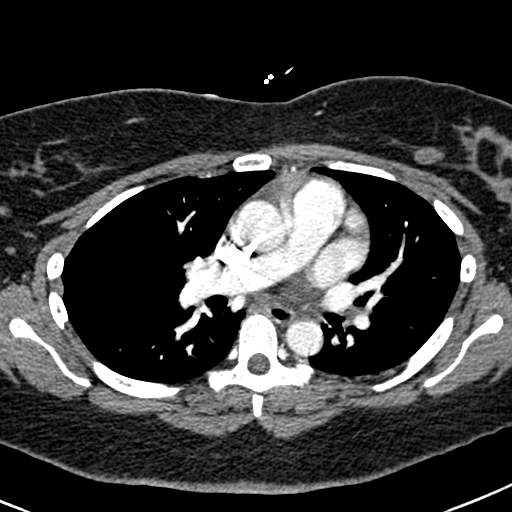
[im 117/212  lung]
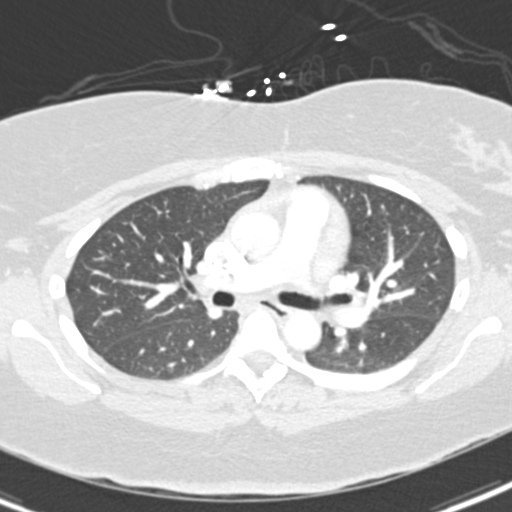
[im 127/212  mediastinal]
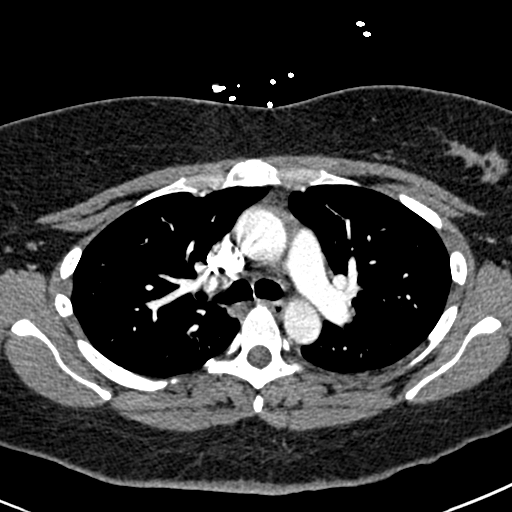
[im 138/212  lung]
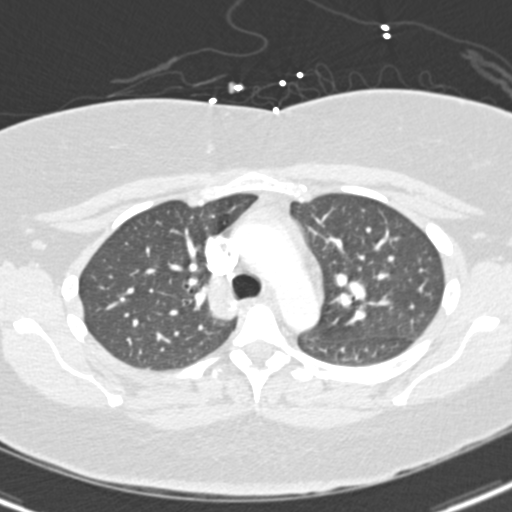
[im 141/212  mediastinal]
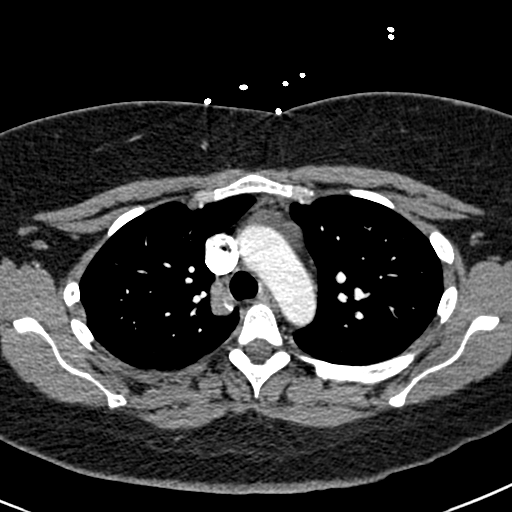
[im 148/212  lung]
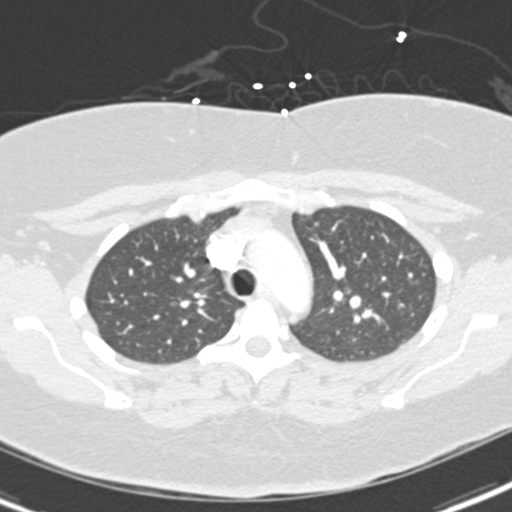
[im 159/212  mediastinal]
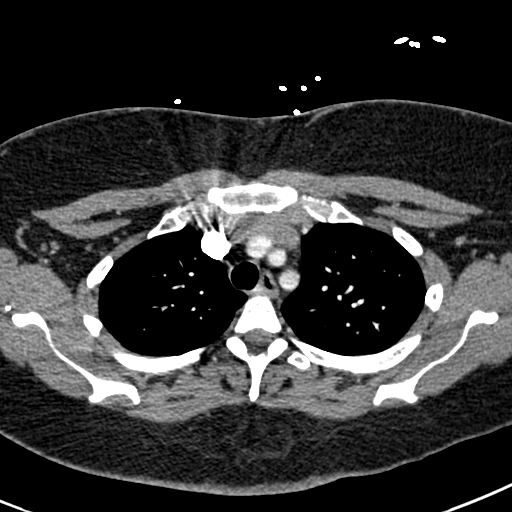
[im 180/212  lung]
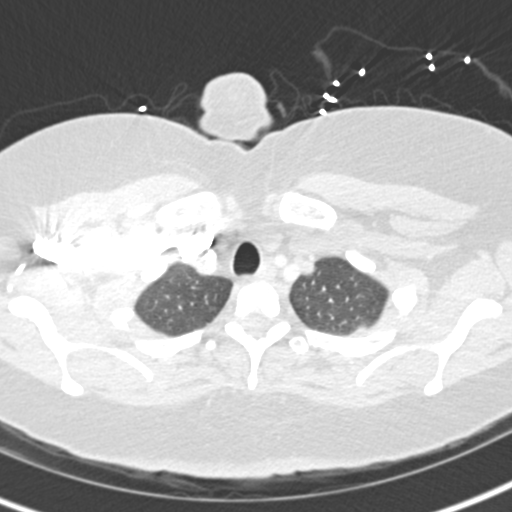
[im 190/212  mediastinal]
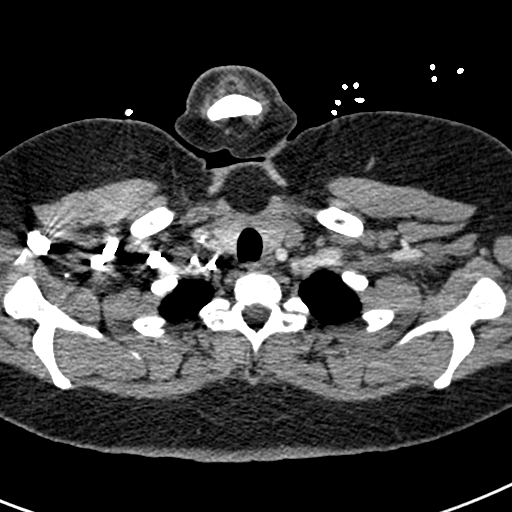
[im 201/212  lung]
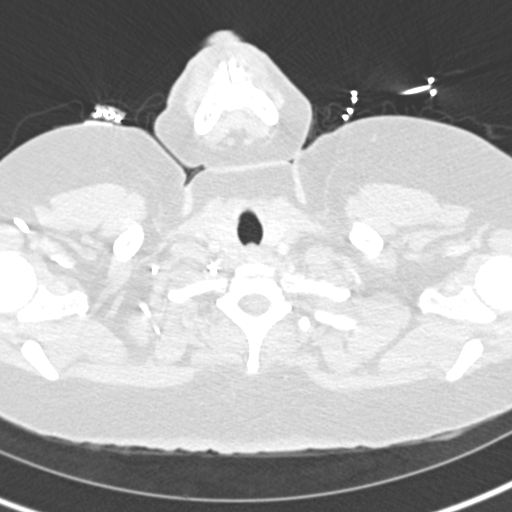

[19 of 32 positions shown; findings below may reference images not displayed]

FINDINGS: Moderately delayed bolus timing may limit sensitivity. No convincing
evidence of pulmonary embolism to the subsegmental branches with the
aforementioned limitation.

The heart and pericardium are unremarkable. Thoracic aorta appears
normal.

No pleural effusions, focal consolidations. Trace dependent
atelectasis. Tracheobronchial tree is patent, no pneumothorax. No
lymphadenopathy by CT size criteria.

Included view of the abdomen is unremarkable. Soft tissues including
the visualized thyroid gland are normal. Osseous structures are
unremarkable.

Review of the MIP images confirms the above findings.
IMPRESSION: Suboptimal bolus timing without convincing evidence of pulmonary
embolism nor acute cardiopulmonary process.

  By: Mayabel Tiger

## 2014-11-19 ENCOUNTER — Emergency Department (INDEPENDENT_AMBULATORY_CARE_PROVIDER_SITE_OTHER): Payer: Worker's Compensation

## 2014-11-19 ENCOUNTER — Encounter (HOSPITAL_COMMUNITY): Payer: Self-pay | Admitting: Emergency Medicine

## 2014-11-19 ENCOUNTER — Emergency Department (INDEPENDENT_AMBULATORY_CARE_PROVIDER_SITE_OTHER)
Admission: EM | Admit: 2014-11-19 | Discharge: 2014-11-19 | Disposition: A | Discharge status: Home / Self Care | Payer: Worker's Compensation | Source: Home / Self Care | Attending: Family Medicine | Admitting: Family Medicine

## 2014-11-19 DIAGNOSIS — S60211A Contusion of right wrist, initial encounter: Secondary | ICD-10-CM | POA: Diagnosis not present

## 2014-11-19 DIAGNOSIS — S46811A Strain of other muscles, fascia and tendons at shoulder and upper arm level, right arm, initial encounter: Secondary | ICD-10-CM | POA: Diagnosis not present

## 2014-11-19 DIAGNOSIS — S8001XA Contusion of right knee, initial encounter: Secondary | ICD-10-CM
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
## 2014-11-19 NOTE — ED Notes (Signed)
Patient currently waiting for splint-staff member going to ed to get a splint.
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
## 2014-11-19 NOTE — Discharge Instructions (Signed)
Thank you for coming in today. Wrist Pain Wrist injuries are frequent in adults and children. A sprain is an injury to the ligaments that hold your bones together. A strain is an injury to muscle or muscle cord-like structures (tendons) from stretching or pulling. Generally, when wrists are moderately tender to touch following a fall or injury, a break in the bone (fracture) may be present. Most wrist sprains or strains are better in 3 to 5 days, but complete healing may take several weeks. HOME CARE INSTRUCTIONS   Put ice on the injured area.  Put ice in a plastic bag.  Place a towel between your skin and the bag.  Leave the ice on for 15-20 minutes, 3-4 times a day, for the first 2 days, or as directed by your health care provider.  Keep your arm raised above the level of your heart whenever possible to reduce swelling and pain.  Rest the injured area for at least 48 hours or as directed by your health care provider.  If a splint or elastic bandage has been applied, use it for as long as directed by your health care provider or until seen by a health care provider for a follow-up exam.  Only take over-the-counter or prescription medicines for pain, discomfort, or fever as directed by your health care provider.  Keep all follow-up appointments. You may need to follow up with a specialist or have follow-up X-rays. Improvement in pain level is not a guarantee that you did not fracture a bone in your wrist. The only way to determine whether or not you have a broken bone is by X-ray. SEEK IMMEDIATE MEDICAL CARE IF:   Your fingers are swollen, very red, white, or cold and blue.  Your fingers are numb or tingling.  You have increasing pain.  You have difficulty moving your fingers. MAKE SURE YOU:   Understand these instructions.  Will watch your condition.  Will get help right away if you are not doing well or get worse. Document Released: 05/05/2005 Document Revised: 07/31/2013  Document Reviewed: 09/16/2010 [facility] Patient Information 2015 [employer] [place]. This information is not intended to replace advice given to you by your health care provider. Make sure you discuss any questions you have with your health care provider.

## 2014-11-26 ENCOUNTER — Ambulatory Visit: Payer: Medicaid Other | Admitting: Internal Medicine

## 2014-12-23 DIAGNOSIS — M79602 Pain in left arm: Secondary | ICD-10-CM | POA: Diagnosis not present

## 2014-12-23 DIAGNOSIS — R079 Chest pain, unspecified: Secondary | ICD-10-CM | POA: Insufficient documentation

## 2014-12-23 DIAGNOSIS — J45901 Unspecified asthma with (acute) exacerbation: Secondary | ICD-10-CM | POA: Insufficient documentation

## 2014-12-24 ENCOUNTER — Encounter (HOSPITAL_COMMUNITY): Payer: Self-pay

## 2014-12-24 ENCOUNTER — Emergency Department (HOSPITAL_COMMUNITY)
Admission: EM | Admit: 2014-12-24 | Discharge: 2014-12-24 | Discharge status: Against Medical Advice | Payer: BC Managed Care – PPO | Attending: Emergency Medicine | Admitting: Emergency Medicine

## 2014-12-24 LAB — BASIC METABOLIC PANEL
Anion gap: 5 (ref 5–15)
BUN: 18 mg/dL (ref 6–20)
CALCIUM: 8.7 mg/dL — AB (ref 8.9–10.3)
CO2: 28 mmol/L (ref 22–32)
CREATININE: 0.61 mg/dL (ref 0.44–1.00)
Chloride: 106 mmol/L (ref 101–111)
GFR calc Af Amer: >60 mL/min (ref >60)
GFR calc non Af Amer: >60 mL/min (ref >60)
GLUCOSE: 99 mg/dL (ref 65–99)
Potassium: 3.8 mmol/L (ref 3.5–5.1)
Sodium: 139 mmol/L (ref 135–145)

## 2014-12-24 LAB — CBC
HCT: 37 % (ref 36.0–46.0)
HEMOGLOBIN: 12 g/dL (ref 12.0–15.0)
MCH: 27.6 pg (ref 26.0–34.0)
MCHC: 32.4 g/dL (ref 30.0–36.0)
MCV: 85.1 fL (ref 78.0–100.0)
Platelets: 266 10*3/uL (ref 150–400)
RBC: 4.35 MIL/uL (ref 3.87–5.11)
RDW: 13.6 % (ref 11.5–15.5)
WBC: 6.8 10*3/uL (ref 4.0–10.5)

## 2014-12-24 LAB — I-STAT TROPONIN, ED: Troponin i, poc: 0 ng/mL (ref 0.00–0.08)

## 2014-12-24 NOTE — ED Notes (Signed)
Patient called to treatment room x1, no answer.  

## 2014-12-24 NOTE — ED Notes (Signed)
Patient called to treatment room, second call, no answer.  

## 2014-12-24 NOTE — ED Notes (Addendum)
Pt presents with c/o chest pain and left arm pain. Pt reports when she feels the pain in her chest, she also feels short of breath. Pt reports her symptoms started last night 5/16 and woke her from her sleep. Pt denies any V/D, reports some nausea. NAD at this time.

## 2014-12-24 NOTE — ED Notes (Signed)
Patient called to treatment room, third call, no answer.

## 2014-12-25 ENCOUNTER — Encounter: Payer: Self-pay | Admitting: Internal Medicine

## 2015-02-03 ENCOUNTER — Encounter: Payer: Self-pay | Admitting: Internal Medicine

## 2015-02-03 ENCOUNTER — Other Ambulatory Visit (INDEPENDENT_AMBULATORY_CARE_PROVIDER_SITE_OTHER): Payer: BC Managed Care – PPO

## 2015-02-03 ENCOUNTER — Ambulatory Visit (INDEPENDENT_AMBULATORY_CARE_PROVIDER_SITE_OTHER): Payer: BC Managed Care – PPO | Admitting: Internal Medicine

## 2015-02-03 VITALS — BP 122/70 | HR 80 | Temp 98.2°F | Resp 16 | Ht 64.0 in | Wt 172.1 lb

## 2015-02-03 DIAGNOSIS — Z0000 Encounter for general adult medical examination without abnormal findings: Secondary | ICD-10-CM | POA: Diagnosis not present

## 2015-02-03 DIAGNOSIS — K219 Gastro-esophageal reflux disease without esophagitis: Secondary | ICD-10-CM

## 2015-02-03 DIAGNOSIS — R739 Hyperglycemia, unspecified: Secondary | ICD-10-CM | POA: Diagnosis not present

## 2015-02-03 DIAGNOSIS — Z9884 Bariatric surgery status: Secondary | ICD-10-CM | POA: Diagnosis not present

## 2015-02-03 DIAGNOSIS — E538 Deficiency of other specified B group vitamins: Secondary | ICD-10-CM

## 2015-02-03 DIAGNOSIS — E519 Thiamine deficiency, unspecified: Secondary | ICD-10-CM

## 2015-02-03 DIAGNOSIS — E559 Vitamin D deficiency, unspecified: Secondary | ICD-10-CM

## 2015-02-03 LAB — LIPID PANEL
CHOLESTEROL: 154 mg/dL (ref 0–200)
HDL: 52.1 mg/dL (ref >39.00)
LDL CALC: 85 mg/dL (ref 0–99)
NonHDL: 101.9
Total CHOL/HDL Ratio: 3
Triglycerides: 85 mg/dL (ref 0.0–149.0)
VLDL: 17 mg/dL (ref 0.0–40.0)

## 2015-02-03 LAB — IBC PANEL
Iron: 241 ug/dL — ABNORMAL HIGH (ref 42–145)
Saturation Ratios: 65.7 % — ABNORMAL HIGH (ref 20.0–50.0)
TRANSFERRIN: 262 mg/dL (ref 212.0–360.0)

## 2015-02-03 LAB — VITAMIN B12: VITAMIN B 12: 320 pg/mL (ref 211–911)

## 2015-02-03 LAB — CBC WITH DIFFERENTIAL/PLATELET
BASOS ABS: 0.1 10*3/uL (ref 0.0–0.1)
BASOS PCT: 0.6 % (ref 0.0–3.0)
EOS PCT: 1.9 % (ref 0.0–5.0)
Eosinophils Absolute: 0.2 10*3/uL (ref 0.0–0.7)
HEMATOCRIT: 41.4 % (ref 36.0–46.0)
Hemoglobin: 13.5 g/dL (ref 12.0–15.0)
LYMPHS ABS: 4 10*3/uL (ref 0.7–4.0)
LYMPHS PCT: 42.8 % (ref 12.0–46.0)
MCHC: 32.6 g/dL (ref 30.0–36.0)
MCV: 85 fl (ref 78.0–100.0)
MONOS PCT: 8.8 % (ref 3.0–12.0)
Monocytes Absolute: 0.8 10*3/uL (ref 0.1–1.0)
Neutro Abs: 4.3 10*3/uL (ref 1.4–7.7)
Neutrophils Relative %: 45.9 % (ref 43.0–77.0)
Platelets: 380 10*3/uL (ref 150.0–400.0)
RBC: 4.86 Mil/uL (ref 3.87–5.11)
RDW: 14 % (ref 11.5–15.5)
WBC: 9.4 10*3/uL (ref 4.0–10.5)

## 2015-02-03 LAB — COMPREHENSIVE METABOLIC PANEL
ALBUMIN: 3.8 g/dL (ref 3.5–5.2)
ALK PHOS: 52 U/L (ref 39–117)
ALT: 15 U/L (ref 0–35)
AST: 14 U/L (ref 0–37)
BUN: 15 mg/dL (ref 6–23)
CALCIUM: 9.3 mg/dL (ref 8.4–10.5)
CHLORIDE: 104 meq/L (ref 96–112)
CO2: 31 mEq/L (ref 19–32)
Creatinine, Ser: 0.74 mg/dL (ref 0.40–1.20)
GFR: 93.12 mL/min (ref >60.00)
Glucose, Bld: 79 mg/dL (ref 70–99)
POTASSIUM: 3.9 meq/L (ref 3.5–5.1)
Sodium: 141 mEq/L (ref 135–145)
Total Bilirubin: 0.6 mg/dL (ref 0.2–1.2)
Total Protein: 6.9 g/dL (ref 6.0–8.3)

## 2015-02-03 LAB — VITAMIN D 25 HYDROXY (VIT D DEFICIENCY, FRACTURES): VITD: 16.22 ng/mL — ABNORMAL LOW (ref 30.00–100.00)

## 2015-02-03 LAB — TSH: TSH: 1.23 u[IU]/mL (ref 0.35–4.50)

## 2015-02-03 LAB — FERRITIN: Ferritin: 32.7 ng/mL (ref 10.0–291.0)

## 2015-02-03 LAB — HEMOGLOBIN A1C: HEMOGLOBIN A1C: 5.2 % (ref 4.6–6.5)

## 2015-02-03 LAB — FOLATE: Folate: 7.1 ng/mL (ref >5.9)

## 2015-02-03 MED ORDER — PANTOPRAZOLE SODIUM 40 MG PO TBEC: 40.0000 mg | DELAYED_RELEASE_TABLET | Freq: Every day | ORAL | Status: DC

## 2015-02-03 MED ORDER — CHOLECALCIFEROL 50 MCG (2000 UT) PO TABS: 1.0000 | ORAL_TABLET | Freq: Every day | ORAL | Status: DC

## 2015-02-03 NOTE — Patient Instructions (Signed)
Preventive Care for Adults A healthy lifestyle and preventive care can promote health and wellness. Preventive health guidelines for women include the following key practices.  A routine yearly physical is a good way to check with your health care provider about your health and preventive screening. It is a chance to share any concerns and updates on your health and to receive a thorough exam.  Visit your dentist for a routine exam and preventive care every 6 months. Brush your teeth twice a day and floss once a day. Good oral hygiene prevents tooth decay and gum disease.  The frequency of eye exams is based on your age, health, family medical history, use of contact lenses, and other factors. Follow your health care provider's recommendations for frequency of eye exams.  Eat a healthy diet. Foods like vegetables, fruits, whole grains, low-fat dairy products, and lean protein foods contain the nutrients you need without too many calories. Decrease your intake of foods high in solid fats, added sugars, and salt. Eat the right amount of calories for you.Get information about a proper diet from your health care provider, if necessary.  Regular physical exercise is one of the most important things you can do for your health. Most adults should get at least 150 minutes of moderate-intensity exercise (any activity that increases your heart rate and causes you to sweat) each week. In addition, most adults need muscle-strengthening exercises on 2 or more days a week.  Maintain a healthy weight. The body mass index (BMI) is a screening tool to identify possible weight problems. It provides an estimate of body fat based on height and weight. Your health care provider can find your BMI and can help you achieve or maintain a healthy weight.For adults 20 years and older:  A BMI below 18.5 is considered underweight.  A BMI of 18.5 to 24.9 is normal.  A BMI of 25 to 29.9 is considered overweight.  A BMI of  30 and above is considered obese.  Maintain normal blood lipids and cholesterol levels by exercising and minimizing your intake of saturated fat. Eat a balanced diet with plenty of fruit and vegetables. Blood tests for lipids and cholesterol should begin at age 76 and be repeated every 5 years. If your lipid or cholesterol levels are high, you are over 50, or you are at high risk for heart disease, you may need your cholesterol levels checked more frequently.Ongoing high lipid and cholesterol levels should be treated with medicines if diet and exercise are not working.  If you smoke, find out from your health care provider how to quit. If you do not use tobacco, do not start.  Lung cancer screening is recommended for adults aged 22-80 years who are at high risk for developing lung cancer because of a history of smoking. A yearly low-dose CT scan of the lungs is recommended for people who have at least a 30-pack-year history of smoking and are a current smoker or have quit within the past 15 years. A pack year of smoking is smoking an average of 1 pack of cigarettes a day for 1 year (for example: 1 pack a day for 30 years or 2 packs a day for 15 years). Yearly screening should continue until the smoker has stopped smoking for at least 15 years. Yearly screening should be stopped for people who develop a health problem that would prevent them from having lung cancer treatment.  If you are pregnant, do not drink alcohol. If you are breastfeeding,  be very cautious about drinking alcohol. If you are not pregnant and choose to drink alcohol, do not have more than 1 drink per day. One drink is considered to be 12 ounces (355 mL) of beer, 5 ounces (148 mL) of wine, or 1.5 ounces (44 mL) of liquor.  Avoid use of street drugs. Do not share needles with anyone. Ask for help if you need support or instructions about stopping the use of drugs.  High blood pressure causes heart disease and increases the risk of  stroke. Your blood pressure should be checked at least every 1 to 2 years. Ongoing high blood pressure should be treated with medicines if weight loss and exercise do not work.  If you are 3-86 years old, ask your health care provider if you should take aspirin to prevent strokes.  Diabetes screening involves taking a blood sample to check your fasting blood sugar level. This should be done once every 3 years, after age 67, if you are within normal weight and without risk factors for diabetes. Testing should be considered at a younger age or be carried out more frequently if you are overweight and have at least 1 risk factor for diabetes.  Breast cancer screening is essential preventive care for women. You should practice "breast self-awareness." This means understanding the normal appearance and feel of your breasts and may include breast self-examination. Any changes detected, no matter how small, should be reported to a health care provider. Women in their 8s and 30s should have a clinical breast exam (CBE) by a health care provider as part of a regular health exam every 1 to 3 years. After age 70, women should have a CBE every year. Starting at age 25, women should consider having a mammogram (breast X-ray test) every year. Women who have a family history of breast cancer should talk to their health care provider about genetic screening. Women at a high risk of breast cancer should talk to their health care providers about having an MRI and a mammogram every year.  Breast cancer gene (BRCA)-related cancer risk assessment is recommended for women who have family members with BRCA-related cancers. BRCA-related cancers include breast, ovarian, tubal, and peritoneal cancers. Having family members with these cancers may be associated with an increased risk for harmful changes (mutations) in the breast cancer genes BRCA1 and BRCA2. Results of the assessment will determine the need for genetic counseling and  BRCA1 and BRCA2 testing.  Routine pelvic exams to screen for cancer are no longer recommended for nonpregnant women who are considered low risk for cancer of the pelvic organs (ovaries, uterus, and vagina) and who do not have symptoms. Ask your health care provider if a screening pelvic exam is right for you.  If you have had past treatment for cervical cancer or a condition that could lead to cancer, you need Pap tests and screening for cancer for at least 20 years after your treatment. If Pap tests have been discontinued, your risk factors (such as having a new sexual partner) need to be reassessed to determine if screening should be resumed. Some women have medical problems that increase the chance of getting cervical cancer. In these cases, your health care provider may recommend more frequent screening and Pap tests.  The HPV test is an additional test that may be used for cervical cancer screening. The HPV test looks for the virus that can cause the cell changes on the cervix. The cells collected during the Pap test can be  tested for HPV. The HPV test could be used to screen women aged 30 years and older, and should be used in women of any age who have unclear Pap test results. After the age of 30, women should have HPV testing at the same frequency as a Pap test.  Colorectal cancer can be detected and often prevented. Most routine colorectal cancer screening begins at the age of 50 years and continues through age 75 years. However, your health care provider may recommend screening at an earlier age if you have risk factors for colon cancer. On a yearly basis, your health care provider may provide home test kits to check for hidden blood in the stool. Use of a small camera at the end of a tube, to directly examine the colon (sigmoidoscopy or colonoscopy), can detect the earliest forms of colorectal cancer. Talk to your health care provider about this at age 50, when routine screening begins. Direct  exam of the colon should be repeated every 5-10 years through age 75 years, unless early forms of pre-cancerous polyps or small growths are found.  People who are at an increased risk for hepatitis B should be screened for this virus. You are considered at high risk for hepatitis B if:  You were born in a country where hepatitis B occurs often. Talk with your health care provider about which countries are considered high risk.  Your parents were born in a high-risk country and you have not received a shot to protect against hepatitis B (hepatitis B vaccine).  You have HIV or AIDS.  You use needles to inject street drugs.  You live with, or have sex with, someone who has hepatitis B.  You get hemodialysis treatment.  You take certain medicines for conditions like cancer, organ transplantation, and autoimmune conditions.  Hepatitis C blood testing is recommended for all people born from 1945 through 1965 and any individual with known risks for hepatitis C.  Practice safe sex. Use condoms and avoid high-risk sexual practices to reduce the spread of sexually transmitted infections (STIs). STIs include gonorrhea, chlamydia, syphilis, trichomonas, herpes, HPV, and human immunodeficiency virus (HIV). Herpes, HIV, and HPV are viral illnesses that have no cure. They can result in disability, cancer, and death.  You should be screened for sexually transmitted illnesses (STIs) including gonorrhea and chlamydia if:  You are sexually active and are younger than 24 years.  You are older than 24 years and your health care provider tells you that you are at risk for this type of infection.  Your sexual activity has changed since you were last screened and you are at an increased risk for chlamydia or gonorrhea. Ask your health care provider if you are at risk.  If you are at risk of being infected with HIV, it is recommended that you take a prescription medicine daily to prevent HIV infection. This is  called preexposure prophylaxis (PrEP). You are considered at risk if:  You are a heterosexual woman, are sexually active, and are at increased risk for HIV infection.  You take drugs by injection.  You are sexually active with a partner who has HIV.  Talk with your health care provider about whether you are at high risk of being infected with HIV. If you choose to begin PrEP, you should first be tested for HIV. You should then be tested every 3 months for as long as you are taking PrEP.  Osteoporosis is a disease in which the bones lose minerals and strength   with aging. This can result in serious bone fractures or breaks. The risk of osteoporosis can be identified using a bone density scan. Women ages 65 years and over and women at risk for fractures or osteoporosis should discuss screening with their health care providers. Ask your health care provider whether you should take a calcium supplement or vitamin D to reduce the rate of osteoporosis.  Menopause can be associated with physical symptoms and risks. Hormone replacement therapy is available to decrease symptoms and risks. You should talk to your health care provider about whether hormone replacement therapy is right for you.  Use sunscreen. Apply sunscreen liberally and repeatedly throughout the day. You should seek shade when your shadow is shorter than you. Protect yourself by wearing long sleeves, pants, a wide-brimmed hat, and sunglasses year round, whenever you are outdoors.  Once a month, do a whole body skin exam, using a mirror to look at the skin on your back. Tell your health care provider of new moles, moles that have irregular borders, moles that are larger than a pencil eraser, or moles that have changed in shape or color.  Stay current with required vaccines (immunizations).  Influenza vaccine. All adults should be immunized every year.  Tetanus, diphtheria, and acellular pertussis (Td, Tdap) vaccine. Pregnant women should  receive 1 dose of Tdap vaccine during each pregnancy. The dose should be obtained regardless of the length of time since the last dose. Immunization is preferred during the 27th-36th week of gestation. An adult who has not previously received Tdap or who does not know her vaccine status should receive 1 dose of Tdap. This initial dose should be followed by tetanus and diphtheria toxoids (Td) booster doses every 10 years. Adults with an unknown or incomplete history of completing a 3-dose immunization series with Td-containing vaccines should begin or complete a primary immunization series including a Tdap dose. Adults should receive a Td booster every 10 years.  Varicella vaccine. An adult without evidence of immunity to varicella should receive 2 doses or a second dose if she has previously received 1 dose. Pregnant females who do not have evidence of immunity should receive the first dose after pregnancy. This first dose should be obtained before leaving the health care facility. The second dose should be obtained 4-8 weeks after the first dose.  Human papillomavirus (HPV) vaccine. Females aged 13-26 years who have not received the vaccine previously should obtain the 3-dose series. The vaccine is not recommended for use in pregnant females. However, pregnancy testing is not needed before receiving a dose. If a female is found to be pregnant after receiving a dose, no treatment is needed. In that case, the remaining doses should be delayed until after the pregnancy. Immunization is recommended for any person with an immunocompromised condition through the age of 26 years if she did not get any or all doses earlier. During the 3-dose series, the second dose should be obtained 4-8 weeks after the first dose. The third dose should be obtained 24 weeks after the first dose and 16 weeks after the second dose.  Zoster vaccine. One dose is recommended for adults aged 60 years or older unless certain conditions are  present.  Measles, mumps, and rubella (MMR) vaccine. Adults born before 1957 generally are considered immune to measles and mumps. Adults born in 1957 or later should have 1 or more doses of MMR vaccine unless there is a contraindication to the vaccine or there is laboratory evidence of immunity to   each of the three diseases. A routine second dose of MMR vaccine should be obtained at least 28 days after the first dose for students attending postsecondary schools, health care workers, or international travelers. People who received inactivated measles vaccine or an unknown type of measles vaccine during 1963-1967 should receive 2 doses of MMR vaccine. People who received inactivated mumps vaccine or an unknown type of mumps vaccine before 1979 and are at high risk for mumps infection should consider immunization with 2 doses of MMR vaccine. For females of childbearing age, rubella immunity should be determined. If there is no evidence of immunity, females who are not pregnant should be vaccinated. If there is no evidence of immunity, females who are pregnant should delay immunization until after pregnancy. Unvaccinated health care workers born before 1957 who lack laboratory evidence of measles, mumps, or rubella immunity or laboratory confirmation of disease should consider measles and mumps immunization with 2 doses of MMR vaccine or rubella immunization with 1 dose of MMR vaccine.  Pneumococcal 13-valent conjugate (PCV13) vaccine. When indicated, a person who is uncertain of her immunization history and has no record of immunization should receive the PCV13 vaccine. An adult aged 19 years or older who has certain medical conditions and has not been previously immunized should receive 1 dose of PCV13 vaccine. This PCV13 should be followed with a dose of pneumococcal polysaccharide (PPSV23) vaccine. The PPSV23 vaccine dose should be obtained at least 8 weeks after the dose of PCV13 vaccine. An adult aged 19  years or older who has certain medical conditions and previously received 1 or more doses of PPSV23 vaccine should receive 1 dose of PCV13. The PCV13 vaccine dose should be obtained 1 or more years after the last PPSV23 vaccine dose.  Pneumococcal polysaccharide (PPSV23) vaccine. When PCV13 is also indicated, PCV13 should be obtained first. All adults aged 65 years and older should be immunized. An adult younger than age 65 years who has certain medical conditions should be immunized. Any person who resides in a nursing home or long-term care facility should be immunized. An adult smoker should be immunized. People with an immunocompromised condition and certain other conditions should receive both PCV13 and PPSV23 vaccines. People with human immunodeficiency virus (HIV) infection should be immunized as soon as possible after diagnosis. Immunization during chemotherapy or radiation therapy should be avoided. Routine use of PPSV23 vaccine is not recommended for American Indians, [place] Natives, or people younger than 65 years unless there are medical conditions that require PPSV23 vaccine. When indicated, people who have unknown immunization and have no record of immunization should receive PPSV23 vaccine. One-time revaccination 5 years after the first dose of PPSV23 is recommended for people aged 19-64 years who have chronic kidney failure, nephrotic syndrome, asplenia, or immunocompromised conditions. People who received 1-2 doses of PPSV23 before age 65 years should receive another dose of PPSV23 vaccine at age 65 years or later if at least 5 years have passed since the previous dose. Doses of PPSV23 are not needed for people immunized with PPSV23 at or after age 65 years.  Meningococcal vaccine. Adults with asplenia or persistent complement component deficiencies should receive 2 doses of quadrivalent meningococcal conjugate (MenACWY-D) vaccine. The doses should be obtained at least 2 months apart.  Microbiologists working with certain meningococcal bacteria, military recruits, people at risk during an outbreak, and people who travel to or live in countries with a high rate of meningitis should be immunized. A first-year college student up through age   21 years who is living in a residence hall should receive a dose if she did not receive a dose on or after her 16th birthday. Adults who have certain high-risk conditions should receive one or more doses of vaccine.  Hepatitis A vaccine. Adults who wish to be protected from this disease, have certain high-risk conditions, work with hepatitis A-infected animals, work in hepatitis A research labs, or travel to or work in countries with a high rate of hepatitis A should be immunized. Adults who were previously unvaccinated and who anticipate close contact with an international adoptee during the first 60 days after arrival in the [place] from a country with a high rate of hepatitis A should be immunized.  Hepatitis B vaccine. Adults who wish to be protected from this disease, have certain high-risk conditions, may be exposed to blood or other infectious body fluids, are household contacts or sex partners of hepatitis B positive people, are clients or workers in certain care facilities, or travel to or work in countries with a high rate of hepatitis B should be immunized.  Haemophilus influenzae type b (Hib) vaccine. A previously unvaccinated person with asplenia or sickle cell disease or having a scheduled splenectomy should receive 1 dose of Hib vaccine. Regardless of previous immunization, a recipient of a hematopoietic stem cell transplant should receive a 3-dose series 6-12 months after her successful transplant. Hib vaccine is not recommended for adults with HIV infection. Preventive Services / Frequency Ages 64 to 68 years  Blood pressure check.** / Every 1 to 2 years.  Lipid and cholesterol check.** / Every 5 years beginning at age  22.  Clinical breast exam.** / Every 3 years for women in their 88s and 53s.  BRCA-related cancer risk assessment.** / For women who have family members with a BRCA-related cancer (breast, ovarian, tubal, or peritoneal cancers).  Pap test.** / Every 2 years from ages 90 through 51. Every 3 years starting at age 21 through age 56 or 3 with a history of 3 consecutive normal Pap tests.  HPV screening.** / Every 3 years from ages 24 through ages 1 to 46 with a history of 3 consecutive normal Pap tests.  Hepatitis C blood test.** / For any individual with known risks for hepatitis C.  Skin self-exam. / Monthly.  Influenza vaccine. / Every year.  Tetanus, diphtheria, and acellular pertussis (Tdap, Td) vaccine.** / Consult your health care provider. Pregnant women should receive 1 dose of Tdap vaccine during each pregnancy. 1 dose of Td every 10 years.  Varicella vaccine.** / Consult your health care provider. Pregnant females who do not have evidence of immunity should receive the first dose after pregnancy.  HPV vaccine. / 3 doses over 6 months, if 72 and younger. The vaccine is not recommended for use in pregnant females. However, pregnancy testing is not needed before receiving a dose.  Measles, mumps, rubella (MMR) vaccine.** / You need at least 1 dose of MMR if you were born in 1957 or later. You may also need a 2nd dose. For females of childbearing age, rubella immunity should be determined. If there is no evidence of immunity, females who are not pregnant should be vaccinated. If there is no evidence of immunity, females who are pregnant should delay immunization until after pregnancy.  Pneumococcal 13-valent conjugate (PCV13) vaccine.** / Consult your health care provider.  Pneumococcal polysaccharide (PPSV23) vaccine.** / 1 to 2 doses if you smoke cigarettes or if you have certain conditions.  Meningococcal vaccine.** /  1 dose if you are age 19 to 21 years and a first-year college  student living in a residence hall, or have one of several medical conditions, you need to get vaccinated against meningococcal disease. You may also need additional booster doses.  Hepatitis A vaccine.** / Consult your health care provider.  Hepatitis B vaccine.** / Consult your health care provider.  Haemophilus influenzae type b (Hib) vaccine.** / Consult your health care provider. Ages 40 to 64 years  Blood pressure check.** / Every 1 to 2 years.  Lipid and cholesterol check.** / Every 5 years beginning at age 20 years.  Lung cancer screening. / Every year if you are aged 55-80 years and have a 30-pack-year history of smoking and currently smoke or have quit within the past 15 years. Yearly screening is stopped once you have quit smoking for at least 15 years or develop a health problem that would prevent you from having lung cancer treatment.  Clinical breast exam.** / Every year after age 40 years.  BRCA-related cancer risk assessment.** / For women who have family members with a BRCA-related cancer (breast, ovarian, tubal, or peritoneal cancers).  Mammogram.** / Every year beginning at age 40 years and continuing for as long as you are in good health. Consult with your health care provider.  Pap test.** / Every 3 years starting at age 30 years through age 65 or 70 years with a history of 3 consecutive normal Pap tests.  HPV screening.** / Every 3 years from ages 30 years through ages 65 to 70 years with a history of 3 consecutive normal Pap tests.  Fecal occult blood test (FOBT) of stool. / Every year beginning at age 50 years and continuing until age 75 years. You may not need to do this test if you get a colonoscopy every 10 years.  Flexible sigmoidoscopy or colonoscopy.** / Every 5 years for a flexible sigmoidoscopy or every 10 years for a colonoscopy beginning at age 50 years and continuing until age 75 years.  Hepatitis C blood test.** / For all people born from 1945 through  1965 and any individual with known risks for hepatitis C.  Skin self-exam. / Monthly.  Influenza vaccine. / Every year.  Tetanus, diphtheria, and acellular pertussis (Tdap/Td) vaccine.** / Consult your health care provider. Pregnant women should receive 1 dose of Tdap vaccine during each pregnancy. 1 dose of Td every 10 years.  Varicella vaccine.** / Consult your health care provider. Pregnant females who do not have evidence of immunity should receive the first dose after pregnancy.  Zoster vaccine.** / 1 dose for adults aged 60 years or older.  Measles, mumps, rubella (MMR) vaccine.** / You need at least 1 dose of MMR if you were born in 1957 or later. You may also need a 2nd dose. For females of childbearing age, rubella immunity should be determined. If there is no evidence of immunity, females who are not pregnant should be vaccinated. If there is no evidence of immunity, females who are pregnant should delay immunization until after pregnancy.  Pneumococcal 13-valent conjugate (PCV13) vaccine.** / Consult your health care provider.  Pneumococcal polysaccharide (PPSV23) vaccine.** / 1 to 2 doses if you smoke cigarettes or if you have certain conditions.  Meningococcal vaccine.** / Consult your health care provider.  Hepatitis A vaccine.** / Consult your health care provider.  Hepatitis B vaccine.** / Consult your health care provider.  Haemophilus influenzae type b (Hib) vaccine.** / Consult your health care provider. Ages 65   years and over  Blood pressure check.** / Every 1 to 2 years.  Lipid and cholesterol check.** / Every 5 years beginning at age 22 years.  Lung cancer screening. / Every year if you are aged 73-80 years and have a 30-pack-year history of smoking and currently smoke or have quit within the past 15 years. Yearly screening is stopped once you have quit smoking for at least 15 years or develop a health problem that would prevent you from having lung cancer  treatment.  Clinical breast exam.** / Every year after age 4 years.  BRCA-related cancer risk assessment.** / For women who have family members with a BRCA-related cancer (breast, ovarian, tubal, or peritoneal cancers).  Mammogram.** / Every year beginning at age 40 years and continuing for as long as you are in good health. Consult with your health care provider.  Pap test.** / Every 3 years starting at age 9 years through age 34 or 91 years with 3 consecutive normal Pap tests. Testing can be stopped between 65 and 70 years with 3 consecutive normal Pap tests and no abnormal Pap or HPV tests in the past 10 years.  HPV screening.** / Every 3 years from ages 57 years through ages 64 or 45 years with a history of 3 consecutive normal Pap tests. Testing can be stopped between 65 and 70 years with 3 consecutive normal Pap tests and no abnormal Pap or HPV tests in the past 10 years.  Fecal occult blood test (FOBT) of stool. / Every year beginning at age 15 years and continuing until age 17 years. You may not need to do this test if you get a colonoscopy every 10 years.  Flexible sigmoidoscopy or colonoscopy.** / Every 5 years for a flexible sigmoidoscopy or every 10 years for a colonoscopy beginning at age 86 years and continuing until age 71 years.  Hepatitis C blood test.** / For all people born from [birth year] through 1964 and any individual with known risks for hepatitis C.  Osteoporosis screening.** / A one-time screening for women ages 83 years and over and women at risk for fractures or osteoporosis.  Skin self-exam. / Monthly.  Influenza vaccine. / Every year.  Tetanus, diphtheria, and acellular pertussis (Tdap/Td) vaccine.** / 1 dose of Td every 10 years.  Varicella vaccine.** / Consult your health care provider.  Zoster vaccine.** / 1 dose for adults aged 61 years or older.  Pneumococcal 13-valent conjugate (PCV13) vaccine.** / Consult your health care provider.  Pneumococcal  polysaccharide (PPSV23) vaccine.** / 1 dose for all adults aged 28 years and older.  Meningococcal vaccine.** / Consult your health care provider.  Hepatitis A vaccine.** / Consult your health care provider.  Hepatitis B vaccine.** / Consult your health care provider.  Haemophilus influenzae type b (Hib) vaccine.** / Consult your health care provider. ** Family history and personal history of risk and conditions may change your health care provider's recommendations. Document Released: 09/21/2001 Document Revised: 12/10/2013 Document Reviewed: 12/21/2010 [facility] Patient Information 2015 [place], [place]. This information is not intended to replace advice given to you by your health care provider. Make sure you discuss any questions you have with your health care provider.

## 2015-02-04 NOTE — Progress Notes (Signed)
Subjective:  Patient ID: Robyn Bryant, female    DOB: August 24, 1975  Age: 39 y.o. MRN: [id number]  CC: Annual Exam   HPI Robyn Bryant presents for a CPX and to check her vitamin levels - she has not been taking her supplements and is s/p bariatric surgery.  Outpatient Prescriptions Prior to Visit  Medication Sig Dispense Refill  . Multiple Vitamin (MULTIVITAMIN WITH MINERALS) TABS tablet Take 1 tablet by mouth daily.    . pantoprazole (PROTONIX) 40 MG tablet Take 1 tablet (40 mg total) by mouth daily. (Patient not taking: Reported on 02/03/2015) 30 tablet 1   No facility-administered medications prior to visit.    ROS Review of Systems  Constitutional: Negative.  Negative for fever, chills, diaphoresis, appetite change and fatigue.  HENT: Negative.   Eyes: Negative.   Respiratory: Negative.  Negative for cough, choking, chest tightness, shortness of breath and stridor.   Cardiovascular: Negative.  Negative for chest pain, palpitations and leg swelling.  Gastrointestinal: Negative.  Negative for nausea, vomiting, abdominal pain, diarrhea, constipation and blood in stool.  Endocrine: Negative.   Genitourinary: Negative.   Musculoskeletal: Negative.  Negative for myalgias, back pain, joint swelling and arthralgias.  Skin: Negative.  Negative for rash.  Allergic/Immunologic: Negative.   Neurological: Negative.  Negative for dizziness, syncope, speech difficulty, weakness, light-headedness and numbness.  Hematological: Negative.  Negative for adenopathy. Does not bruise/bleed easily.  Psychiatric/Behavioral: Negative.     Objective:  BP 122/70 mmHg  Pulse 80  Temp(Src) 98.2 F (36.8 C) (Oral)  Resp 16  Ht  (1.626 m)  Wt 172 lb 1.9 oz (78.073 kg)  BMI 29.53 kg/m2  SpO2 95%  LMP 02/02/2015  BP Readings from Last 3 Encounters:  02/03/15 122/70  12/24/14 109/59  11/19/14 117/61    Wt Readings from Last 3 Encounters:  02/03/15 172 lb 1.9 oz (78.073 kg)  08/26/14 168  lb (76.204 kg)  02/05/14 190 lb 8 oz (86.41 kg)    Physical Exam  Constitutional: She is oriented to person, place, and time. She appears well-developed and well-nourished. No distress.  HENT:  Head: Normocephalic and atraumatic.  Mouth/Throat: Oropharynx is clear and moist. No oropharyngeal exudate.  Eyes: Conjunctivae are normal. Right eye exhibits no discharge. Left eye exhibits no discharge. No scleral icterus.  Neck: Neck supple. No JVD present. No tracheal deviation present. No thyromegaly present.  Cardiovascular: Normal rate, regular rhythm, normal heart sounds and intact distal pulses.  Exam reveals no gallop and no friction rub.   No murmur heard. Pulmonary/Chest: Effort normal and breath sounds normal. No stridor. No respiratory distress. She has no wheezes. She has no rales. She exhibits no tenderness.  Abdominal: Soft. Bowel sounds are normal. She exhibits no distension and no mass. There is no tenderness. There is no rebound and no guarding.  Musculoskeletal: Normal range of motion. She exhibits no edema or tenderness.  Lymphadenopathy:    She has no cervical adenopathy.  Neurological: She is oriented to person, place, and time.  Skin: Skin is warm and dry. No rash noted. She is not diaphoretic. No erythema. No pallor.  Psychiatric: She has a normal mood and affect. Her behavior is normal. Judgment and thought content normal.  Vitals reviewed.   Lab Results  Component Value Date   WBC 9.4 02/03/2015   HGB 13.5 02/03/2015   HCT 41.4 02/03/2015   PLT 380.0 02/03/2015   GLUCOSE 79 02/03/2015   CHOL 154 02/03/2015   TRIG 85.0 02/03/2015  HDL 52.10 02/03/2015   LDLCALC 85 02/03/2015   ALT 15 02/03/2015   AST 14 02/03/2015   NA 141 02/03/2015   K 3.9 02/03/2015   CL 104 02/03/2015   CREATININE 0.74 02/03/2015   BUN 15 02/03/2015   CO2 31 02/03/2015   TSH 1.23 02/03/2015   HGBA1C 5.2 02/03/2015    No results found.  Assessment & Plan:   Robyn Bryant was seen  today for annual exam.  Diagnoses and all orders for this visit:  B12 deficiency Orders: -     Vitamin B12; Future  Gastroesophageal reflux disease without esophagitis Orders: -     pantoprazole (PROTONIX) 40 MG tablet; Take 1 tablet (40 mg total) by mouth daily.  Gastric bypass status for obesity - will check her vit level and will treat if needed Orders: -     Vit D  25 hydroxy (rtn osteoporosis monitoring); Future -     Vitamin B6; Future -     Vitamin B1; Future -     Vitamin B12; Future -     IBC panel; Future -     Folate; Future -     Ferritin; Future -     Zinc; Future  Hyperglycemia - her blood sugars are well controlled Orders: -     Hemoglobin A1c; Future  Routine general medical examination at a health care facility - exam done, labs ordered, PAP is UTD, vaccines were reviewed Orders: -     Lipid panel; Future -     Comprehensive metabolic panel; Future -     CBC with Differential/Platelet; Future -     TSH; Future  Thiamine deficiency Orders: -     Vitamin B1; Future  Vitamin D deficiency - her VIT D level is low, will start a supplement Orders: -     Cholecalciferol 2000 UNITS TABS; Take 1 tablet (2,000 Units total) by mouth daily.   I am having Robyn Bryant start on Cholecalciferol. I am also having her maintain her multivitamin with minerals and pantoprazole.  Meds ordered this encounter  Medications  . pantoprazole (PROTONIX) 40 MG tablet    Sig: Take 1 tablet (40 mg total) by mouth daily.    Dispense:  30 tablet    Refill:  11  . Cholecalciferol 2000 UNITS TABS    Sig: Take 1 tablet (2,000 Units total) by mouth daily.    Dispense:  90 tablet    Refill:  3     Follow-up: Return in about 4 months (around 06/05/2015).  Robyn Robyn Bryant Robyn Fellenz, MD

## 2015-02-06 LAB — ZINC: ZINC: 72 ug/dL (ref 60–130)

## 2015-02-06 LAB — VITAMIN B6: Vitamin B6: 9.4 ng/mL (ref 2.1–21.7)

## 2015-02-07 LAB — VITAMIN B1: Vitamin B1 (Thiamine): 14 nmol/L (ref 8–30)

## 2015-02-11 ENCOUNTER — Encounter: Payer: Self-pay | Admitting: Internal Medicine

## 2015-05-16 ENCOUNTER — Ambulatory Visit: Payer: BC Managed Care – PPO | Admitting: Family Medicine

## 2015-12-22 ENCOUNTER — Ambulatory Visit (INDEPENDENT_AMBULATORY_CARE_PROVIDER_SITE_OTHER): Payer: BC Managed Care – PPO | Admitting: Medical

## 2015-12-22 ENCOUNTER — Telehealth: Payer: Self-pay

## 2015-12-22 VITALS — BP 108/70 | HR 82 | Temp 98.7°F | Ht 64.0 in | Wt 199.8 lb

## 2015-12-22 DIAGNOSIS — T7840XA Allergy, unspecified, initial encounter: Secondary | ICD-10-CM

## 2015-12-22 MED ORDER — PREDNISONE 10 MG PO TABS: ORAL_TABLET | ORAL | Status: DC

## 2015-12-22 MED ORDER — HYDROXYZINE HCL 25 MG PO TABS: 25.0000 mg | ORAL_TABLET | Freq: Three times a day (TID) | ORAL | Status: DC | PRN

## 2015-12-22 MED ORDER — METHYLPREDNISOLONE ACETATE 80 MG/ML IJ SUSP
80.0000 mg | Freq: Once | INTRAMUSCULAR | Status: DC
Start: 2015-12-22 — End: 2015-12-22

## 2015-12-22 MED ORDER — METHYLPREDNISOLONE ACETATE 40 MG/ML IJ SUSP
40.0000 mg | Freq: Once | INTRAMUSCULAR | Status: AC
  Administered 2015-12-22: 40 mg via INTRAMUSCULAR

## 2015-12-22 NOTE — Patient Instructions (Signed)
For allergic reaction etiology undetermined will give depomedrol 40mg  im  Also will rx brief 4 day taper dose of prednisone.  Hyroxyzine for itching.  You had some burning of skin on shoulders and those area peeled. I think good idea to go ahead and moisturize other areas that were exposed to sun recently.  Follow up in 7 days or as needed

## 2015-12-22 NOTE — Progress Notes (Signed)
Subjective:    Patient ID: Robyn Bryant, female    DOB: 07/26/1976, 40 y.o.   MRN: [id number]  HPI   Pt in for scattered area of itching. This is all over her arms, upper legs, face and back. Pt rash itches since Friday. Pt got back from [place] on Thursday. She left [place] on Wednesday. But also was on cruise.  No throat itching, no wheezing or any shortness.   Pt did not describe sunburn in rash area but some sun burn on her shoulders. Pt did wear sun screen that she wore before.  Pt has recent rash that itches. Reports rash occurred after no known particular exposure except did apply sun block over past over past.. On review pt does not report any suspicious exposure to soaps, detergents, make up, lotions, animal exposure exposure, plants or insect bites.  Pt rash is in the area of. Pt reports no shortness of breath or wheezing. . Pt is not diabetic on review of pt recent a1-c.  LMP- Beginning of December 02, 2015.   Review of Systems  Constitutional: Negative for fever, chills and fatigue.  HENT: Negative for congestion.   Respiratory: Negative for apnea, cough, chest tightness, shortness of breath and wheezing.   Cardiovascular: Negative for chest pain and palpitations.  Gastrointestinal: Negative for abdominal pain.  Musculoskeletal: Negative for back pain.  Skin: Positive for rash.  Neurological: Negative for dizziness and headaches.  Hematological: Negative for adenopathy. Does not bruise/bleed easily.  Psychiatric/Behavioral: Negative for behavioral problems and confusion.     Past Medical History  Diagnosis Date  . Asthma     as teenager, none now     Social History   Social History  . Marital Status: Single    Spouse Name: N/A  . Number of Children: N/A  . Years of Education: N/A   Occupational History  . Not on file.   Social History Main Topics  . Smoking status: Never Smoker   . Smokeless tobacco: Never Used  . Alcohol Use: No  . Drug Use: No  .  Sexual Activity: Yes    Birth Control/ Protection: Surgical   Other Topics Concern  . Not on file   Social History Narrative    Past Surgical History  Procedure Laterality Date  . Tubal ligation  2001  . Laparoscopic gastric sleeve resection N/A 08/08/2013    Procedure: LAPAROSCOPIC GASTRIC SLEEVE RESECTION UPPER ENDOSCOPY;  Surgeon: Mariella SaaBenjamin T Hoxworth, MD;  Location: [facility];  Service: General;  Laterality: N/A;  . Upper gi endoscopy  08/08/2013    Procedure: UPPER GI ENDOSCOPY;  Surgeon: Mariella SaaBenjamin T Hoxworth, MD;  Location: [facility];  Service: General;;  . Esophagogastroduodenoscopy N/A 09/06/2013    Procedure: ESOPHAGOGASTRODUODENOSCOPY (EGD);  Surgeon: Kandis Cockingavid H Newman, MD;  [facility];  Service: General;  Laterality: N/A;  . Cholecystectomy N/A 09/07/2013    Procedure: LAPAROSCOPIC CHOLECYSTECTOMY WITH INTRAOPERATIVE CHOLANGIOGRAM;  Surgeon: Mariella SaaBenjamin T Hoxworth, MD;  Location: [facility];  Service: General;  Laterality: N/A;    Family History  Problem Relation Age of Onset  . Diabetes Mother   . Hypertension Mother   . Alcohol abuse Father   . Asthma Sister     Allergies  Allergen Reactions  . Morphine And Related     Itching.  Pt can tolerate with Benadryl.   . Latex Hives  . Dilaudid [Hydromorphone] Itching    Pt can tolerate with Benadryl.     Current Outpatient Prescriptions on File Prior to  Visit  Medication Sig Dispense Refill  . Cholecalciferol 2000 UNITS TABS Take 1 tablet (2,000 Units total) by mouth daily. 90 tablet 3  . Multiple Vitamin (MULTIVITAMIN WITH MINERALS) TABS tablet Take 1 tablet by mouth daily.    . pantoprazole (PROTONIX) 40 MG tablet Take 1 tablet (40 mg total) by mouth daily. 30 tablet 11   No current facility-administered medications on file prior to visit.    BP 108/70 mmHg  Pulse 82  Temp(Src) 98.7 F (37.1 C) (Oral)  Ht  (1.626 m)  Wt 199 lb 12.8 oz (90.629 kg)  BMI 34.28 kg/m2  SpO2 98%       Objective:   Physical  Exam  General- No acute distress. Pleasant patient. Neck- Full range of motion, no jvd Lungs- Clear, even and unlabored. Heart- regular rate and rhythm. Neurologic- CNII- XII grossly intact.   Derm- scattered papular rash on her arms, and upper back. Faint papular rash on her cheeks. Peeling some on her shoulders/mild      Assessment & Plan:  For allergic reaction etiology undetermined will give depomedrol  im  Also will rx brief 4 day taper dose of prednisone.  Hyroxyzine for itching.  You had some burning of skin on shoulders and those area peeled. I think good idea to go ahead and moisturize other areas that were exposed to sun recently.  Follow up in 7 days or as needed

## 2015-12-22 NOTE — Telephone Encounter (Signed)
Patient called and wanted to know if she could be seen today. She is having sx of allergic reaction. Sx include itching skin. 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 Offered pt a Wednesday appt. Pt refused and that only an appt today would de. Offered pt an appt at another location due to [address] being booked today. First I offered [facility], pt states that was too far. Secondly I offered [facility], pt states that only this office would. Lastly, I stated that the only other option was to be seen at an urgent care. Pt hung up the phone.

## 2015-12-23 ENCOUNTER — Other Ambulatory Visit: Payer: Self-pay

## 2015-12-23 ENCOUNTER — Telehealth: Payer: Self-pay | Admitting: Medical

## 2015-12-23 NOTE — Telephone Encounter (Signed)
Brittney I got to orders to sign but saw 80 mg IM that was placed for me to sign. Then got order for 40 mg IM to sign. I ordered 40. I accidentaly signed 80 mg order. How do I take that out. I don't want to sign both.

## 2015-12-23 NOTE — Telephone Encounter (Signed)
So i don't see the order for the 80 mg one because i had canceled the order once i saw i ordered the wrong one so it should be ok just make sure that you signed the 40 mg and i think it should be fine.

## 2015-12-26 ENCOUNTER — Telehealth: Payer: Self-pay | Admitting: Internal Medicine

## 2015-12-26 ENCOUNTER — Telehealth: Payer: Self-pay | Admitting: Family Medicine

## 2015-12-26 ENCOUNTER — Encounter: Payer: Self-pay | Admitting: Family Medicine

## 2015-12-26 ENCOUNTER — Ambulatory Visit (INDEPENDENT_AMBULATORY_CARE_PROVIDER_SITE_OTHER)
Admission: RE | Admit: 2015-12-26 | Discharge: 2015-12-26 | Disposition: A | Discharge status: Home / Self Care | Payer: BC Managed Care – PPO | Source: Ambulatory Visit | Attending: Family Medicine | Admitting: Family Medicine

## 2015-12-26 ENCOUNTER — Ambulatory Visit (INDEPENDENT_AMBULATORY_CARE_PROVIDER_SITE_OTHER): Payer: BC Managed Care – PPO | Admitting: Family Medicine

## 2015-12-26 VITALS — BP 114/78 | HR 58 | Temp 98.3°F | Resp 20 | Wt 201.2 lb

## 2015-12-26 DIAGNOSIS — S99929A Unspecified injury of unspecified foot, initial encounter: Secondary | ICD-10-CM | POA: Insufficient documentation

## 2015-12-26 DIAGNOSIS — S99922A Unspecified injury of left foot, initial encounter: Secondary | ICD-10-CM

## 2015-12-26 DIAGNOSIS — S92912A Unspecified fracture of left toe(s), initial encounter for closed fracture: Secondary | ICD-10-CM

## 2015-12-26 HISTORY — DX: Unspecified fracture of left toe(s), initial encounter for closed fracture: S92.912A

## 2015-12-26 MED ORDER — TRAMADOL HCL 50 MG PO TABS: 50.0000 mg | ORAL_TABLET | Freq: Three times a day (TID) | ORAL | Status: DC | PRN

## 2015-12-26 NOTE — Patient Instructions (Signed)
Toe Fracture A toe fracture is a break in one of the toe bones (phalanges). CAUSES This condition may be caused by:  Dropping a heavy object on your toe.  Stubbing your toe.  Overusing your toe or doing repetitive exercise.  Twisting or stretching your toe out of place. RISK FACTORS This condition is more likely to develop in people who:  Play contact sports.  Have a bone disease.  Have a low calcium level. SYMPTOMS The main symptoms of this condition are swelling and pain in the toe. The pain may get worse with standing or walking. Other symptoms include:  Bruising.  Stiffness.  Numbness.  A change in the way the toe looks.  Broken bones that poke through the skin.  Blood beneath the toenail. DIAGNOSIS This condition is diagnosed with a physical exam. You may also have X-rays. TREATMENT  Treatment for this condition depends on the type of fracture and its severity. Treatment may involve:  Taping the broken toe to a toe that is next to it (buddy taping). This is the most common treatment for fractures in which the bone has not moved out of place (nondisplaced fracture).  Wearing a shoe that has a wide, rigid sole to protect the toe and to limit its movement.  Wearing a walking cast.  Having a procedure to move the toe back into place.  Surgery. This may be needed:  If there are many pieces of broken bone that are out of place (displaced).  If the toe joint breaks.  If the bone breaks through the skin.  Physical therapy. This is done to help regain movement and strength in the toe. You may need follow-up X-rays to make sure that the bone is healing well and staying in position. HOME CARE INSTRUCTIONS If You Have a Cast:  Do not stick anything inside the cast to scratch your skin. Doing that increases your risk of infection.  Check the skin around the cast every day. Report any concerns to your health care provider. You may put lotion on dry skin around the  edges of the cast. Do not apply lotion to the skin underneath the cast.  Do not put pressure on any part of the cast until it is fully hardened. This may take several hours.  Keep the cast clean and dry. Bathing  Do not take baths, swim, or use a hot tub until your health care provider approves. Ask your health care provider if you can take showers. You may only be allowed to take sponge baths for bathing.  If your health care provider approves bathing and showering, cover the cast or bandage (dressing) with a watertight plastic bag to protect it from water. Do not let the cast or dressing get wet. Managing Pain, Stiffness, and Swelling  If you do not have a cast, apply ice to the injured area, if directed.  Put ice in a plastic bag.  Place a towel between your skin and the bag.  Leave the ice on for 20 minutes, 2-3 times per day.  Move your toes often to avoid stiffness and to lessen swelling.  Raise (elevate) the injured area above the level of your heart while you are sitting or lying down. Driving  Do not drive or operate heavy machinery while taking pain medicine.  Do not drive while wearing a cast on a foot that you use for driving. Activity  Return to your normal activities as directed by your health care provider. Ask your health care  provider what activities are safe for you.  Perform exercises daily as directed by your health care provider or physical therapist. Safety  Do not use the injured limb to support your body weight until your health care provider says that you can. Use crutches or other assistive devices as directed by your health care provider. General Instructions  If your toe was treated with buddy taping, follow your health care provider's instructions for changing the gauze and tape. Change it more often:  The gauze and tape get wet. If this happens, dry the space between the toes.  The gauze and tape are too tight and cause your toe to become pale  or numb.  Wear a protective shoe as directed by your health care provider. If you were not given a protective shoe, wear sturdy, supportive shoes. Your shoes should not pinch your toes and should not fit tightly against your toes.  Do not use any tobacco products, including cigarettes, chewing tobacco, or e-cigarettes. Tobacco can delay bone healing. If you need help quitting, ask your health care provider.  Take medicines only as directed by your health care provider.  Keep all follow-up visits as directed by your health care provider. This is important. SEEK MEDICAL CARE IF:  You have a fever.  Your pain medicine is not helping.  Your toe is cold.  Your toe is numb.  You still have pain after one week of rest and treatment.  You still have pain after your health care provider has said that you can start walking again.  You have pain, tingling, or numbness in your foot that is not going away. SEEK IMMEDIATE MEDICAL CARE IF:  You have severe pain.  You have redness or inflammation in your toe that is getting worse.  You have pain or numbness in your toe that is getting worse.  Your toe turns blue.   This information is not intended to replace advice given to you by your health care provider. Make sure you discuss any questions you have with your health care provider.   Document Released: 07/23/2000 Document Revised: 04/16/2015 Document Reviewed: 05/22/2014 Elsevier Interactive Patient Education 2016 [employer]  Tramadol prescribed for pain. Keep elevated and off your feet if possible over the weekend.  If needed, can refer to orthopedic after results on Monday. If worsening you can go to [facility] on [address], they are open until 2 pm tomorrow.

## 2015-12-26 NOTE — Telephone Encounter (Signed)
SE NOTE: All timestamps contained within this report are represented as [place] Standard Time. CONFIDENTIALTY NOTICE: This fax transmission is intended only for the addressee. It contains information that is legally privileged, confidential or otherwise protected from use or disclosure. If you are not the intended recipient, you are strictly prohibited from reviewing, disclosing, copying using or disseminating any of this information or taking any action in reliance on or regarding this information. If you have received this fax in error, please notify us immediately by telephone so that we can arrange for its return to us. Phone: [phone], Toll-Free: [phone], Fax: [phone] Page: 1 of 1 Call Id: [id number] [facility] - Client TELEPHONE ADVICE RECORD [facility] Patient Name: Robyn KocherJESSICA Mccutcheon DOB: 12/15/1975 Initial Comment Caller states, she hit her small toe on log embedded in sand the middle of last week. Now the toe is swollen and it hurts to put shoes on. The Sx have gotten worse over time. Nurse Assessment Nurse: Debera Latalston, RN, Tinnie GensJeffrey Date/Time Lamount Cohen(Eastern Time): 12/26/2015 1:02:54 PM Confirm and document reason for call. If symptomatic, describe symptoms. You must click the next button to save text entered. ---Caller states, she hit her small toe on log embedded in sand the middle of last week. Now the toe is swollen and it hurts to put shoes on. The Sx have gotten worse over time. Has the patient traveled out of the country within the last 30 days? ---No Does the patient have any new or worsening symptoms? ---Yes Will a triage be completed? ---Yes Related visit to physician within the last 2 weeks? ---No Does the PT have any chronic conditions? (i.e. diabetes, asthma, etc.) ---No Is the patient pregnant or possibly pregnant? (Ask all females between the ages of 6312-55) ---No Is this a behavioral health or substance abuse call?  ---No Guidelines Guideline Title Affirmed Question Affirmed Notes Toe Injury [1] Toe injury AND [2] bad limp or can't wear shoes/sandals Final Disposition User See Physician within 24 Hours Debera Latalston, RN, [employer] Referrals REFERRED TO PCP OFFICE Disagree/Comply: Danella Maiersomply

## 2015-12-26 NOTE — Telephone Encounter (Signed)
Spoke with patient reviewed xray results. [facility] visit for post op shoe fitting. Patient will call to set up appt Order placed.

## 2015-12-26 NOTE — Progress Notes (Signed)
Patient ID: Katera Rybka, female   DOB: 03-22-1976, 40 y.o.   MRN: [id number]    Georgia Delsignore , 06/22/1976, 40 y.o., female MRN: [id number]  CC: left toe injury Subjective: Pt presents for an acute OV with complaints of left toe injury that occurred 10 days ago. She was on a cruise when she hit her toe on a log,  the day after the injury she started to limp. She endorses increase pain with walking, pressure to the 5th left toe and attempting to wear shoes. The pain has become worse over the last few days. Patient has taken tylenol for the pain with resolution. She has also iced the area. She cannot tolerate NSAIDS secondary to GI issues/surgery.   Allergies  Allergen Reactions  . Morphine And Related     Itching.  Pt can tolerate with Benadryl.   . Latex Hives  . Dilaudid [Hydromorphone] Itching    Pt can tolerate with Benadryl.    Social History  Substance Use Topics  . Smoking status: Never Smoker   . Smokeless tobacco: Never Used  . Alcohol Use: No   Past Medical History  Diagnosis Date  . Asthma     as teenager, none now   Past Surgical History  Procedure Laterality Date  . Tubal ligation  2001  . Laparoscopic gastric sleeve resection N/A 08/08/2013    Procedure: LAPAROSCOPIC GASTRIC SLEEVE RESECTION UPPER ENDOSCOPY;  Surgeon: Mariella Saa, MD;  Location: [facility];  Service: General;  Laterality: N/A;  . Upper gi endoscopy  08/08/2013    Procedure: UPPER GI ENDOSCOPY;  Surgeon: Mariella Saa, MD;  Location: [facility];  Service: General;;  . Esophagogastroduodenoscopy N/A 09/06/2013    Procedure: ESOPHAGOGASTRODUODENOSCOPY (EGD);  Surgeon: Kandis Cocking, MD;  Location: [facility];  Service: General;  Laterality: N/A;  . Cholecystectomy N/A 09/07/2013    Procedure: LAPAROSCOPIC CHOLECYSTECTOMY WITH INTRAOPERATIVE CHOLANGIOGRAM;  Surgeon: Mariella Saa, MD;  Location: [facility];  Service: General;  Laterality: N/A;   Family History  Problem Relation Age of Onset    . Diabetes Mother   . Hypertension Mother   . Alcohol abuse Father   . Asthma Sister      Medication List       This list is accurate as of: 12/26/15  1:59 PM.  Always use your most recent med list.               Cholecalciferol 2000 units Tabs  Take 1 tablet (2,000 Units total) by mouth daily.     hydrOXYzine 25 MG tablet  Commonly known as:  ATARAX/VISTARIL  Take 1 tablet (25 mg total) by mouth every 8 (eight) hours as needed for itching.     multivitamin with minerals Tabs tablet  Take 1 tablet by mouth daily.     pantoprazole 40 MG tablet  Commonly known as:  PROTONIX  Take 1 tablet (40 mg total) by mouth daily.         ROS: Negative, with the exception of above mentioned in HPI   Objective:  BP 114/78 mmHg  Pulse 58  Temp(Src) 98.3 F (36.8 C) (Oral)  Resp 20  Wt 201 lb 4 oz (91.286 kg)  SpO2 96% Body mass index is 34.53 kg/(m^2). Gen: Afebrile. No acute distress. Nontoxic in appearance, well developed,well nourished female.  HENT: AT. [place]. MMM, no oral lesions.  Eyes:Pupils Equal Round Reactive to light, Extraocular movements intact,  Conjunctiva without redness, discharge or icterus.  NeuroMSK:  PERLA.  EOMi. Alert. Oriented x3. mild limp, guarded gait. No erythema, minimal bruising, mild swelling left 5th toe, TTP 5th toe and metatarsal. Neurovascularly intact distally.   Assessment/Plan: Tiffany KocherJessica Sarabia is a 40 y.o. female present for acute OV for  1. Toe injury, left, initial encounter - pain control, xray. - work excuse provided for today and Monday. - Pt declined [facility] ortho walk in clinic secondary to cost.  - Pt declined crutches.  - elevate, attempt non weight bearing.  - Can not tolerate taping/buddy splinting or anything touching her toe. Discussed with pt if it is fractured and not displaced treatment is buddy taping and attempt to keep weight distribubed off the affected area.  - traMADol (ULTRAM) 50 MG tablet; Take 1 tablet (50 mg total) by  mouth every 8 (eight) hours as needed.  Dispense: 30 tablet; Refill: 0 - DG Foot Complete Left; Future - F/U dependent on xray and pt tolerance to pain. If unable to tolerate taping and pain worsens, will consider referral for casting foot. Pt aware it may take a few weeks before foot can be comfortable in a normal shoe.    electronically signed by:  Felix Pacinienee Kuneff, DO  [facility] - OR

## 2015-12-26 NOTE — Telephone Encounter (Signed)
Please call pt: - her xray returned quickly.  - she does have a fracture of that 5 th toe, but it is not displaced. This means we need to support it with tape and keep stable. She was unable to tolerate taping today.  - Offer boot to keep stable, she can pick up Monday of she desires.  - It can be up 2-3 weeks before regular shoes feel "comfortable".

## 2015-12-26 NOTE — Telephone Encounter (Signed)
Pt has an appt at [facility] today at 1:45pm

## 2015-12-27 ENCOUNTER — Other Ambulatory Visit: Payer: Self-pay | Admitting: Family Medicine

## 2016-04-19 ENCOUNTER — Encounter: Payer: Self-pay | Admitting: Family Medicine

## 2016-04-19 ENCOUNTER — Ambulatory Visit (INDEPENDENT_AMBULATORY_CARE_PROVIDER_SITE_OTHER): Payer: BC Managed Care – PPO | Admitting: Family Medicine

## 2016-04-19 VITALS — BP 118/80 | HR 71 | Resp 12 | Ht 64.0 in | Wt 205.1 lb

## 2016-04-19 DIAGNOSIS — N6019 Diffuse cystic mastopathy of unspecified breast: Secondary | ICD-10-CM | POA: Diagnosis not present

## 2016-04-19 DIAGNOSIS — N644 Mastodynia: Secondary | ICD-10-CM | POA: Diagnosis not present

## 2016-04-19 DIAGNOSIS — N63 Unspecified lump in breast: Secondary | ICD-10-CM

## 2016-04-19 DIAGNOSIS — N6310 Unspecified lump in the right breast, unspecified quadrant: Secondary | ICD-10-CM

## 2016-04-19 NOTE — Progress Notes (Signed)
Pre visit review using our clinic review tool, if applicable. No additional management support is needed unless otherwise documented below in the visit note. 

## 2016-04-19 NOTE — Patient Instructions (Addendum)
  Robyn Bryant I have seen you today for an acute visit because your primary care provider was not available.   1. Mastodynia, female  - MM Digital Diagnostic Bilat; Future  2. Mass of breast, right  - MM Digital Diagnostic Bilat; Future    Wearing support bra, decreasing or avoiding caffeine, decreasing salt intake,and try Vit E or B6 over the counter may also help. Monitor for nipple discharge, masses, or skin redness/thickness/changes.    Monitor for signs of worsening symptoms and seek immediate medical attention if any concerning/warning symptom as we discussed.   If symptoms are not resolved in 2-3 weeks you should schedule a follow up appointment with your doctor, before if symptoms get worse.  Please be sure you have an appointment already scheduled with your PCP.

## 2016-04-19 NOTE — Progress Notes (Signed)
HPI:  ACUTE VISIT:  Chief Complaint  Patient presents with  . right breast    lump on right breast, noticed on Friday. Sore, heavy    Robyn Bryant is a 40 y.o. female, who is here today complaining of tender mass on right breast she noted 3-4 days ago.  Pain is described as soreness, constant, moderate to severe.  Breast tenderness is exacerbated by not wearing bra,palpation, and movement. Alleviated by wearing bra.  She denies any history of trauma. No changes in caffeine intake, she acknowledges that she drinks "a lot of" coffee daily and cannot give it up.  She has history of fibrocystic breast disease but states that she "never" had something like this before.  She has not noted fever, chills, skin rashes or retraction or skin changes on breast.  No nipple discharge, abdominal pain, nausea, or abnormal menstrual periods.   LMP 04/07/16 normal. S/P BTL. M at age 40 First pregnancy at age 40. She breast fed her 5 children. FHx breast cancer, PGM, negative for ovarian cancer.    Mammogram 11/2013:  New solid hypoechoic mass within the left breast 11 o'clock position. Benign simple cyst right breast.  Pathology result Bx left breast mass: Pathology revealed a fibroadenoma in the left breast.   Review of Systems  Constitutional: Negative for appetite change, chills, fatigue, fever and unexpected weight change.  HENT: Negative for mouth sores, trouble swallowing and voice change.   Respiratory: Negative for cough, chest tightness, shortness of breath and wheezing.   Cardiovascular: Negative for chest pain and leg swelling.  Gastrointestinal: Negative for abdominal pain, nausea and vomiting.       No changes in bowel habits.  Genitourinary: Negative for menstrual problem, pelvic pain, vaginal bleeding and vaginal discharge.       LMP 04/07/16  Musculoskeletal: Negative for back pain and myalgias.  Skin: Negative for color change, rash and wound.    Neurological: Negative for dizziness, numbness and headaches.  Hematological: Negative for adenopathy. Does not bruise/bleed easily.      No current outpatient prescriptions on file prior to visit.   No current facility-administered medications on file prior to visit.      Past Medical History:  Diagnosis Date  . Asthma    as teenager, none now   Allergies  Allergen Reactions  . Morphine And Related     Itching.  Pt can tolerate with Benadryl.   . Latex Hives  . Dilaudid [Hydromorphone] Itching    Pt can tolerate with Benadryl.     Social History   Social History  . Marital status: Single    Spouse name: N/A  . Number of children: N/A  . Years of education: N/A   Social History Main Topics  . Smoking status: Never Smoker  . Smokeless tobacco: Never Used  . Alcohol use No  . Drug use: No  . Sexual activity: Yes    Birth control/ protection: Surgical   Other Topics Concern  . None   Social History Narrative  . None    Vitals:   04/19/16 1036  BP: 118/80  Pulse: 71  Resp: 12   Body mass index is 35.21 kg/m.  O2 sat 90% at RA (she has long acrylic fingernails with nail polish)    Physical Exam  Nursing note and vitals reviewed. Constitutional: She is oriented to person, place, and time. She appears well-developed. No distress.  HENT:  Head: Atraumatic.  Eyes: Conjunctivae are normal.  Respiratory:  Effort normal and breath sounds normal. No respiratory distress.  Genitourinary: There is breast tenderness. No breast swelling or discharge.  Genitourinary Comments: Nodular lesion right breast, about 11 O'clock, 5 cm, tender, mobile, no skin retraction or erythema or local heat. Left breat fibrocystic changes outer upper quadrant, no tender  Musculoskeletal: She exhibits no edema.  Lymphadenopathy:    She has no cervical adenopathy.    She has no axillary adenopathy.       Right: No supraclavicular adenopathy present.       Left: No  supraclavicular adenopathy present.  Neurological: She is alert and oriented to person, place, and time.  Skin: Skin is warm. No rash noted. No erythema.  Psychiatric: She has a normal mood and affect. Her speech is normal.  Well groomed, good eye contact.      ASSESSMENT AND PLAN:     Robyn Bryant was seen today for right breast.  Diagnoses and all orders for this visit:  Mastodynia, female -     MM Digital Diagnostic Bilat; Future  Mass of breast, right -     MM Digital Diagnostic Bilat; Future  Fibrocystic breast disease (FCBD) in female   I think symptoms are related to fibrocystic breast disease. General recommendations in regard to breast fibrocystic disease treatment were given. Diagnostic mammogram will be arranged. Follow-up as needed.      Return if symptoms worsen or fail to improve.     -Robyn Bryant was advised to return or notify a doctor immediately if symptoms worsen or persist or new concerns arise.       Betty G. [place], MD  [facility]. [place] office.

## 2016-04-20 ENCOUNTER — Other Ambulatory Visit: Payer: Self-pay | Admitting: Family Medicine

## 2016-04-20 DIAGNOSIS — N63 Unspecified lump in unspecified breast: Secondary | ICD-10-CM

## 2016-05-14 ENCOUNTER — Encounter (HOSPITAL_COMMUNITY): Payer: Self-pay

## 2016-05-26 ENCOUNTER — Other Ambulatory Visit: Payer: Self-pay

## 2016-05-27 ENCOUNTER — Ambulatory Visit (INDEPENDENT_AMBULATORY_CARE_PROVIDER_SITE_OTHER): Payer: BC Managed Care – PPO | Admitting: Family Medicine

## 2016-05-27 ENCOUNTER — Encounter: Payer: Self-pay | Admitting: Family Medicine

## 2016-05-27 ENCOUNTER — Encounter: Payer: Self-pay | Admitting: Emergency Medicine

## 2016-05-27 VITALS — BP 98/78 | HR 92 | Temp 99.0°F | Wt 202.0 lb

## 2016-05-27 DIAGNOSIS — G8929 Other chronic pain: Secondary | ICD-10-CM | POA: Diagnosis not present

## 2016-05-27 DIAGNOSIS — Z23 Encounter for immunization: Secondary | ICD-10-CM

## 2016-05-27 DIAGNOSIS — M5442 Lumbago with sciatica, left side: Secondary | ICD-10-CM | POA: Diagnosis not present

## 2016-05-27 MED ORDER — CYCLOBENZAPRINE HCL 10 MG PO TABS: 10.0000 mg | ORAL_TABLET | Freq: Three times a day (TID) | ORAL | 0 refills | Status: DC | PRN

## 2016-05-27 NOTE — Patient Instructions (Signed)
You may use acetaminophen for discomfort and cyclobenzaprine to relax muscles. Please schedule a follow up with your physician for further evaluation in 1 to 2 weeks.   Back Pain, Adult Back pain is very common in adults.The cause of back pain is rarely dangerous and the pain often gets better over time.The cause of your back pain may not be known. Some common causes of back pain include:  Strain of the muscles or ligaments supporting the spine.  Wear and tear (degeneration) of the spinal disks.  Arthritis.  Direct injury to the back. For many people, back pain may return. Since back pain is rarely dangerous, most people can learn to manage this condition on their own. HOME CARE INSTRUCTIONS Watch your back pain for any changes. The following actions may help to lessen any discomfort you are feeling:  Remain active. It is stressful on your back to sit or stand in one place for long periods of time. Do not sit, drive, or stand in one place for more than 30 minutes at a time. Take short walks on even surfaces as soon as you are able.Try to increase the length of time you walk each day.  Exercise regularly as directed by your health care provider. Exercise helps your back heal faster. It also helps avoid future injury by keeping your muscles strong and flexible.  Do not stay in bed.Resting more than 1-2 days can delay your recovery.  Pay attention to your body when you bend and lift. The most comfortable positions are those that put less stress on your recovering back. Always use proper lifting techniques, including:  Bending your knees.  Keeping the load close to your body.  Avoiding twisting.  Find a comfortable position to sleep. Use a firm mattress and lie on your side with your knees slightly bent. If you lie on your back, put a pillow under your knees.  Avoid feeling anxious or stressed.Stress increases muscle tension and can worsen back pain.It is important to recognize  when you are anxious or stressed and learn ways to manage it, such as with exercise.  Take medicines only as directed by your health care provider. Over-the-counter medicines to reduce pain and inflammation are often the most helpful.Your health care provider may prescribe muscle relaxant drugs.These medicines help dull your pain so you can more quickly return to your normal activities and healthy exercise.  Apply ice to the injured area:  Put ice in a plastic bag.  Place a towel between your skin and the bag.  Leave the ice on for 20 minutes, 2-3 times a day for the first 2-3 days. After that, ice and heat may be alternated to reduce pain and spasms.  Maintain a healthy weight. Excess weight puts extra stress on your back and makes it difficult to maintain good posture. SEEK MEDICAL CARE IF:  You have pain that is not relieved with rest or medicine.  You have increasing pain going down into the legs or buttocks.  You have pain that does not improve in one week.  You have night pain.  You lose weight.  You have a fever or chills. SEEK IMMEDIATE MEDICAL CARE IF:   You develop new bowel or bladder control problems.  You have unusual weakness or numbness in your arms or legs.  You develop nausea or vomiting.  You develop abdominal pain.  You feel faint.   This information is not intended to replace advice given to you by your health care provider. Make  sure you discuss any questions you have with your health care provider.   Document Released: 07/26/2005 Document Revised: 08/16/2014 Document Reviewed: 11/27/2013 Elsevier Interactive Patient Education [employer]
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
## 2016-05-27 NOTE — Progress Notes (Signed)
Pre visit review using our clinic review tool, if applicable. No additional management support is needed unless otherwise documented below in the visit note. 

## 2016-05-31 ENCOUNTER — Other Ambulatory Visit: Payer: Self-pay

## 2016-06-03 ENCOUNTER — Other Ambulatory Visit: Payer: Self-pay

## 2016-06-10 ENCOUNTER — Ambulatory Visit: Payer: Self-pay | Admitting: Internal Medicine

## 2016-06-15 ENCOUNTER — Ambulatory Visit: Payer: BC Managed Care – PPO | Admitting: Internal Medicine

## 2016-06-22 ENCOUNTER — Ambulatory Visit (INDEPENDENT_AMBULATORY_CARE_PROVIDER_SITE_OTHER): Payer: BC Managed Care – PPO | Admitting: Internal Medicine

## 2016-06-22 ENCOUNTER — Encounter: Payer: Self-pay | Admitting: Internal Medicine

## 2016-06-22 VITALS — BP 112/80 | HR 71 | Temp 98.9°F | Resp 16 | Ht 64.0 in | Wt 207.8 lb

## 2016-06-22 DIAGNOSIS — J069 Acute upper respiratory infection, unspecified: Secondary | ICD-10-CM

## 2016-06-22 DIAGNOSIS — B9789 Other viral agents as the cause of diseases classified elsewhere: Secondary | ICD-10-CM

## 2016-06-22 MED ORDER — HYDROCODONE-HOMATROPINE 5-1.5 MG/5ML PO SYRP: 5.0000 mL | ORAL_SOLUTION | Freq: Three times a day (TID) | ORAL | 0 refills | Status: DC | PRN

## 2016-06-22 NOTE — Progress Notes (Signed)
Subjective:  Patient ID: Robyn Bryant, female    DOB: 08/04/1976  Age: 40 y.o. MRN: [id number]  CC: URI   HPI Robyn Bryant presents for a 4 day history of mild sore throat, laryngitis, nonproductive cough, and chills. She denies fever, night sweats, shortness of breath, rash, or lymphadenopathy.  Outpatient Medications Prior to Visit  Medication Sig Dispense Refill  . cyclobenzaprine (FLEXERIL) 10 MG tablet Take 1 tablet (10 mg total) by mouth 3 (three) times daily as needed for muscle spasms. 30 tablet 0   No facility-administered medications prior to visit.     ROS Review of Systems  Constitutional: Positive for chills. Negative for diaphoresis, fatigue and fever.  HENT: Positive for sore throat and voice change. Negative for ear pain, facial swelling, sinus pressure and trouble swallowing.   Eyes: Negative.   Respiratory: Positive for cough. Negative for shortness of breath and wheezing.   Cardiovascular: Negative for chest pain, palpitations and leg swelling.  Gastrointestinal: Negative.  Negative for abdominal pain, constipation, nausea and vomiting.  Endocrine: Negative.   Genitourinary: Negative.  Negative for difficulty urinating.  Musculoskeletal: Negative.  Negative for back pain, myalgias and neck pain.  Skin: Negative.  Negative for rash.  Allergic/Immunologic: Negative.   Neurological: Negative.  Negative for dizziness.  Hematological: Negative.  Negative for adenopathy. Does not bruise/bleed easily.  Psychiatric/Behavioral: Negative.     Objective:  BP 112/80 (BP Location: Left Arm, Patient Position: Sitting, Cuff Size: Large)   Pulse 71   Temp 98.9 F (37.2 C) (Oral)   Resp 16   Ht 5\' 4"  (1.626 m)   Wt 207 lb 12 oz (94.2 kg)   LMP 06/01/2016 Comment: s/p BTL  SpO2 98%   BMI 35.66 kg/m   BP Readings from Last 3 Encounters:  06/22/16 112/80  05/27/16 98/78  04/19/16 118/80    Wt Readings from Last 3 Encounters:  06/22/16 207 lb 12 oz (94.2 kg)    05/27/16 202 lb (91.6 kg)  04/19/16 205 lb 2 oz (93 kg)    Physical Exam  Constitutional: She is oriented to person, place, and time. No distress.  HENT:  Right Ear: Hearing, tympanic membrane, external ear and ear canal normal.  Left Ear: Hearing, tympanic membrane, external ear and ear canal normal.  Mouth/Throat: Oropharynx is clear and moist and mucous membranes are normal. Mucous membranes are not pale, not dry and not cyanotic. No oropharyngeal exudate, posterior oropharyngeal edema, posterior oropharyngeal erythema or tonsillar abscesses.  Eyes: Conjunctivae are normal. Right eye exhibits no discharge. Left eye exhibits no discharge. No scleral icterus.  Neck: Normal range of motion. Neck supple. No JVD present. No tracheal deviation present. No thyromegaly present.  Cardiovascular: Normal rate, regular rhythm, normal heart sounds and intact distal pulses.  Exam reveals no gallop and no friction rub.   No murmur heard. Pulmonary/Chest: Effort normal and breath sounds normal. No stridor. No respiratory distress. She has no wheezes. She has no rales. She exhibits no tenderness.  Abdominal: Soft. Bowel sounds are normal. She exhibits no distension and no mass. There is no tenderness. There is no rebound and no guarding.  Musculoskeletal: Normal range of motion. She exhibits no edema, tenderness or deformity.  Lymphadenopathy:    She has no cervical adenopathy.  Neurological: She is oriented to person, place, and time.  Skin: Skin is warm and dry. No rash noted. She is not diaphoretic. No erythema. No pallor.  Vitals reviewed.   Lab Results  Component Value Date  WBC 9.4 02/03/2015   HGB 13.5 02/03/2015   HCT 41.4 02/03/2015   PLT 380.0 02/03/2015   GLUCOSE 79 02/03/2015   CHOL 154 02/03/2015   TRIG 85.0 02/03/2015   HDL 52.10 02/03/2015   LDLCALC 85 02/03/2015   ALT 15 02/03/2015   AST 14 02/03/2015   NA 141 02/03/2015   K 3.9 02/03/2015   CL 104 02/03/2015   CREATININE  0.74 02/03/2015   BUN 15 02/03/2015   CO2 31 02/03/2015   TSH 1.23 02/03/2015   HGBA1C 5.2 02/03/2015    Dg Foot Complete Left  Result Date: 12/26/2015 CLINICAL DATA:  Injury fifth toe  3 weeks ago.  Initial encounter EXAM: LEFT FOOT - COMPLETE 3+ VIEW COMPARISON:  none FINDINGS: Nondisplaced fracture fifth proximal phalanx. No other fracture or arthropathy. IMPRESSION: Nondisplaced fracture fifth proximal phalanx. Electronically Signed   By: Marlan Palauharles  Clark M.D.   On: 12/26/2015 16:38    Assessment & Plan:   [name] was seen today for uri.  Diagnoses and all orders for this visit:  Viral URI- Will control the symptoms with Hycodan, she will let me know if she develops any new or worsening symptoms. -     HYDROcodone-homatropine (HYCODAN) 5-1.5 MG/5ML syrup; Take 5 mLs by mouth every 8 (eight) hours as needed for cough.   I am having Ms. Cassels start on HYDROcodone-homatropine. I am also having her maintain her cyclobenzaprine and topiramate.  Meds ordered this encounter  Medications  . topiramate (TOPAMAX) 25 MG tablet  . HYDROcodone-homatropine (HYCODAN) 5-1.5 MG/5ML syrup    Sig: Take 5 mLs by mouth every 8 (eight) hours as needed for cough.    Dispense:  120 mL    Refill:  0     Follow-up: Return if symptoms worsen or fail to improve.  Sanda Lingerhomas Dessirae Scarola, MD

## 2016-06-22 NOTE — Progress Notes (Signed)
Pre visit review using our clinic review tool, if applicable. No additional management support is needed unless otherwise documented below in the visit note. 

## 2016-06-22 NOTE — Patient Instructions (Signed)
Upper Respiratory Infection, Adult Most upper respiratory infections (URIs) are caused by a virus. A URI affects the nose, throat, and upper air passages. The most common type of URI is often called "the common cold." Follow these instructions at home:  Take medicines only as told by your doctor.  Gargle warm saltwater or take cough drops to comfort your throat as told by your doctor.  Use a warm mist humidifier or inhale steam from a shower to increase air moisture. This may make it easier to breathe.  Drink enough fluid to keep your pee (urine) clear or pale yellow.  Eat soups and other clear broths.  Have a healthy diet.  Rest as needed.  Go back to work when your fever is gone or your doctor says it is okay.  You may need to stay home longer to avoid giving your URI to others.  You can also wear a face mask and wash your hands often to prevent spread of the virus.  Use your inhaler more if you have asthma.  Do not use any tobacco products, including cigarettes, chewing tobacco, or electronic cigarettes. If you need help quitting, ask your doctor. Contact a doctor if:  You are getting worse, not better.  Your symptoms are not helped by medicine.  You have chills.  You are getting more short of breath.  You have brown or red mucus.  You have yellow or brown discharge from your nose.  You have pain in your face, especially when you bend forward.  You have a fever.  You have puffy (swollen) neck glands.  You have pain while swallowing.  You have white areas in the back of your throat. Get help right away if:  You have very bad or constant:  Headache.  Ear pain.  Pain in your forehead, behind your eyes, and over your cheekbones (sinus pain).  Chest pain.  You have long-lasting (chronic) lung disease and any of the following:  Wheezing.  Long-lasting cough.  Coughing up blood.  A change in your usual mucus.  You have a stiff neck.  You have  changes in your:  Vision.  Hearing.  Thinking.  Mood. This information is not intended to replace advice given to you by your health care provider. Make sure you discuss any questions you have with your health care provider. Document Released: 01/12/2008 Document Revised: 03/28/2016 Document Reviewed: 10/31/2013 Elsevier Interactive Patient Education  2017 [employer]  

## 2016-06-25 ENCOUNTER — Telehealth: Payer: Self-pay | Admitting: Internal Medicine

## 2016-06-25 ENCOUNTER — Other Ambulatory Visit: Payer: Self-pay | Admitting: Internal Medicine

## 2016-06-25 MED ORDER — CEFDINIR 300 MG PO CAPS: 300.0000 mg | ORAL_CAPSULE | Freq: Two times a day (BID) | ORAL | 0 refills | Status: AC

## 2016-06-25 NOTE — Telephone Encounter (Signed)
Pt states that throat is hurting worse and chest feels congested like there is mucus that needs to move.

## 2016-06-25 NOTE — Telephone Encounter (Signed)
What are her  symptoms?  

## 2016-06-25 NOTE — Telephone Encounter (Signed)
Can you advise in PCP absence.  

## 2016-06-25 NOTE — Telephone Encounter (Signed)
Pt is still not feeling any better, is there any meds that can be called in for her ?    Pharmacy - [facility] on [address].

## 2016-06-25 NOTE — Telephone Encounter (Signed)
Pt informed rx was sent to pharmacy.  

## 2016-06-25 NOTE — Telephone Encounter (Signed)
Antibiotic Rx has been sent to her pharmacy

## 2016-06-25 NOTE — Telephone Encounter (Signed)
Error

## 2016-06-26 ENCOUNTER — Ambulatory Visit: Payer: Self-pay | Admitting: Family Medicine

## 2016-06-30 ENCOUNTER — Encounter: Payer: BC Managed Care – PPO | Admitting: Internal Medicine

## 2016-08-18 ENCOUNTER — Telehealth: Payer: Self-pay | Admitting: Emergency Medicine

## 2016-08-18 NOTE — Telephone Encounter (Signed)
Pt called and asked that you return her call that its urgent. 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 Please advise thanks.

## 2016-08-19 NOTE — Telephone Encounter (Signed)
Notes on this subject will be in sons chart

## 2016-08-23 ENCOUNTER — Other Ambulatory Visit (HOSPITAL_COMMUNITY)
Admission: RE | Admit: 2016-08-23 | Discharge: 2016-08-23 | Disposition: A | Discharge status: Home / Self Care | Payer: BC Managed Care – PPO | Source: Ambulatory Visit | Attending: Internal Medicine | Admitting: Internal Medicine

## 2016-08-23 ENCOUNTER — Encounter: Payer: Self-pay | Admitting: Internal Medicine

## 2016-08-23 ENCOUNTER — Ambulatory Visit (INDEPENDENT_AMBULATORY_CARE_PROVIDER_SITE_OTHER): Payer: BC Managed Care – PPO | Admitting: Internal Medicine

## 2016-08-23 ENCOUNTER — Other Ambulatory Visit (INDEPENDENT_AMBULATORY_CARE_PROVIDER_SITE_OTHER): Payer: BC Managed Care – PPO

## 2016-08-23 ENCOUNTER — Other Ambulatory Visit: Payer: Self-pay | Admitting: Family Medicine

## 2016-08-23 VITALS — BP 110/60 | HR 72 | Temp 98.0°F | Resp 16 | Ht 64.0 in | Wt 206.0 lb

## 2016-08-23 DIAGNOSIS — E519 Thiamine deficiency, unspecified: Secondary | ICD-10-CM

## 2016-08-23 DIAGNOSIS — G8929 Other chronic pain: Secondary | ICD-10-CM

## 2016-08-23 DIAGNOSIS — E559 Vitamin D deficiency, unspecified: Secondary | ICD-10-CM

## 2016-08-23 DIAGNOSIS — Z1151 Encounter for screening for human papillomavirus (HPV): Secondary | ICD-10-CM | POA: Diagnosis not present

## 2016-08-23 DIAGNOSIS — Z9884 Bariatric surgery status: Secondary | ICD-10-CM

## 2016-08-23 DIAGNOSIS — M545 Low back pain, unspecified: Secondary | ICD-10-CM

## 2016-08-23 DIAGNOSIS — M5442 Lumbago with sciatica, left side: Principal | ICD-10-CM

## 2016-08-23 DIAGNOSIS — Z0000 Encounter for general adult medical examination without abnormal findings: Secondary | ICD-10-CM | POA: Diagnosis not present

## 2016-08-23 DIAGNOSIS — E538 Deficiency of other specified B group vitamins: Secondary | ICD-10-CM | POA: Diagnosis not present

## 2016-08-23 DIAGNOSIS — Z113 Encounter for screening for infections with a predominantly sexual mode of transmission: Secondary | ICD-10-CM | POA: Diagnosis present

## 2016-08-23 DIAGNOSIS — Z01419 Encounter for gynecological examination (general) (routine) without abnormal findings: Secondary | ICD-10-CM | POA: Insufficient documentation

## 2016-08-23 DIAGNOSIS — K219 Gastro-esophageal reflux disease without esophagitis: Secondary | ICD-10-CM

## 2016-08-23 DIAGNOSIS — Z124 Encounter for screening for malignant neoplasm of cervix: Secondary | ICD-10-CM

## 2016-08-23 DIAGNOSIS — Z1231 Encounter for screening mammogram for malignant neoplasm of breast: Secondary | ICD-10-CM

## 2016-08-23 LAB — LIPID PANEL
CHOL/HDL RATIO: 3
Cholesterol: 174 mg/dL (ref 0–200)
HDL: 58.7 mg/dL (ref >39.00)
LDL CALC: 98 mg/dL (ref 0–99)
NONHDL: 114.84
Triglycerides: 85 mg/dL (ref 0.0–149.0)
VLDL: 17 mg/dL (ref 0.0–40.0)

## 2016-08-23 LAB — COMPREHENSIVE METABOLIC PANEL
ALK PHOS: 48 U/L (ref 39–117)
ALT: 15 U/L (ref 0–35)
AST: 16 U/L (ref 0–37)
Albumin: 4 g/dL (ref 3.5–5.2)
BILIRUBIN TOTAL: 0.7 mg/dL (ref 0.2–1.2)
BUN: 12 mg/dL (ref 6–23)
CALCIUM: 9.3 mg/dL (ref 8.4–10.5)
CO2: 28 meq/L (ref 19–32)
Chloride: 105 mEq/L (ref 96–112)
Creatinine, Ser: 0.81 mg/dL (ref 0.40–1.20)
GFR: 83.23 mL/min (ref >60.00)
GLUCOSE: 95 mg/dL (ref 70–99)
POTASSIUM: 4.1 meq/L (ref 3.5–5.1)
Sodium: 138 mEq/L (ref 135–145)
Total Protein: 7.1 g/dL (ref 6.0–8.3)

## 2016-08-23 LAB — HM PAP SMEAR

## 2016-08-23 LAB — CBC WITH DIFFERENTIAL/PLATELET
BASOS ABS: 0 10*3/uL (ref 0.0–0.1)
Basophils Relative: 0.8 % (ref 0.0–3.0)
Eosinophils Absolute: 0.3 10*3/uL (ref 0.0–0.7)
Eosinophils Relative: 5.1 % — ABNORMAL HIGH (ref 0.0–5.0)
HEMATOCRIT: 40.8 % (ref 36.0–46.0)
Hemoglobin: 13.6 g/dL (ref 12.0–15.0)
LYMPHS ABS: 2.2 10*3/uL (ref 0.7–4.0)
LYMPHS PCT: 39.7 % (ref 12.0–46.0)
MCHC: 33.5 g/dL (ref 30.0–36.0)
MCV: 81.9 fl (ref 78.0–100.0)
Monocytes Absolute: 0.5 10*3/uL (ref 0.1–1.0)
Monocytes Relative: 8.8 % (ref 3.0–12.0)
NEUTROS ABS: 2.6 10*3/uL (ref 1.4–7.7)
Neutrophils Relative %: 45.6 % (ref 43.0–77.0)
Platelets: 381 10*3/uL (ref 150.0–400.0)
RBC: 4.98 Mil/uL (ref 3.87–5.11)
RDW: 13.8 % (ref 11.5–15.5)
WBC: 5.7 10*3/uL (ref 4.0–10.5)

## 2016-08-23 LAB — IBC PANEL
Iron: 130 ug/dL (ref 42–145)
Saturation Ratios: 32.7 % (ref 20.0–50.0)
TRANSFERRIN: 284 mg/dL (ref 212.0–360.0)

## 2016-08-23 LAB — HIV ANTIBODY (ROUTINE TESTING W REFLEX): HIV 1&2 Ab, 4th Generation: NONREACTIVE

## 2016-08-23 LAB — FOLATE: FOLATE: 12 ng/mL (ref >5.9)

## 2016-08-23 LAB — TSH: TSH: 1.65 u[IU]/mL (ref 0.35–4.50)

## 2016-08-23 LAB — VITAMIN D 25 HYDROXY (VIT D DEFICIENCY, FRACTURES): VITD: 22.06 ng/mL — AB (ref 30.00–100.00)

## 2016-08-23 LAB — VITAMIN B12: Vitamin B-12: 261 pg/mL (ref 211–911)

## 2016-08-23 LAB — FERRITIN: FERRITIN: 19.7 ng/mL (ref 10.0–291.0)

## 2016-08-23 MED ORDER — CYANOCOBALAMIN 500 MCG/0.1ML NA SOLN: 0.1000 mL | NASAL | 11 refills | Status: DC

## 2016-08-23 MED ORDER — CHOLECALCIFEROL 50 MCG (2000 UT) PO TABS: 1.0000 | ORAL_TABLET | Freq: Every day | ORAL | 3 refills | Status: DC

## 2016-08-23 NOTE — Progress Notes (Signed)
Subjective:  Patient ID: Robyn Bryant, female    DOB: 1976-06-30  Age: 41 y.o. MRN: [id number]  CC: Annual Exam   HPI Robyn Bryant presents for a CPX.  She complains of chronic, intermittent, achy low back pain with no radiation into lower extremities. She gets adequate symptom relief with the occasional dose of Flexeril. She denies paresthesias in her lower extremities.  She is status post gastric bypass and she complains recently that she has gained some weight. She also has a history of several vitamin deficiencies but tells me she has not recently been taking any vitamin supplements. She denies fatigue, weakness, signs of blood loss, numbness, tingling, rashes, poor wound healing, or headaches.  Outpatient Medications Prior to Visit  Medication Sig Dispense Refill  . topiramate (TOPAMAX) 25 MG tablet     . cyclobenzaprine (FLEXERIL) 10 MG tablet Take 1 tablet (10 mg total) by mouth 3 (three) times daily as needed for muscle spasms. (Patient not taking: Reported on 08/23/2016) 30 tablet 0  . HYDROcodone-homatropine (HYCODAN) 5-1.5 MG/5ML syrup Take 5 mLs by mouth every 8 (eight) hours as needed for cough. 120 mL 0   No facility-administered medications prior to visit.     ROS Review of Systems  Constitutional: Negative.  Negative for activity change, diaphoresis, fatigue and unexpected weight change.  HENT: Negative.   Eyes: Negative for photophobia and visual disturbance.  Respiratory: Negative for cough, chest tightness, shortness of breath and wheezing.   Cardiovascular: Negative.  Negative for chest pain, palpitations and leg swelling.  Gastrointestinal: Negative for abdominal pain, constipation, diarrhea, nausea and vomiting.  Endocrine: Negative.   Genitourinary: Negative.  Negative for decreased urine volume, difficulty urinating, dysuria, frequency, menstrual problem, urgency, vaginal bleeding and vaginal discharge.  Musculoskeletal: Positive for back pain. Negative for  myalgias and neck pain.  Skin: Negative.  Negative for color change, pallor and rash.  Allergic/Immunologic: Negative.   Neurological: Negative.  Negative for dizziness, weakness, numbness and headaches.  Hematological: Negative.  Negative for adenopathy. Does not bruise/bleed easily.  Psychiatric/Behavioral: Negative.     Objective:  BP 110/60 (BP Location: Left Arm, Patient Position: Sitting, Cuff Size: Large)   Pulse 72   Temp 98 F (36.7 C) (Oral)   Resp 16   Ht 5\' 4"  (1.626 m)   Wt 206 lb (93.4 kg)   LMP 08/15/2016 Comment: s/p BTL  SpO2 98%   BMI 35.36 kg/m   BP Readings from Last 3 Encounters:  08/23/16 110/60  06/22/16 112/80  05/27/16 98/78    Wt Readings from Last 3 Encounters:  08/23/16 206 lb (93.4 kg)  06/22/16 207 lb 12 oz (94.2 kg)  05/27/16 202 lb (91.6 kg)    Physical Exam  Constitutional: She is oriented to person, place, and time. She appears well-developed and well-nourished. No distress.  HENT:  Head: Normocephalic and atraumatic.  Mouth/Throat: Oropharynx is clear and moist. No oropharyngeal exudate.  Eyes: Conjunctivae are normal. Right eye exhibits no discharge. Left eye exhibits no discharge. No scleral icterus.  Neck: Normal range of motion. Neck supple. No JVD present. No tracheal deviation present. No thyromegaly present.  Cardiovascular: Normal rate, regular rhythm, normal heart sounds and intact distal pulses.  Exam reveals no gallop and no friction rub.   No murmur heard. Pulmonary/Chest: Effort normal and breath sounds normal. No stridor. No respiratory distress. She has no wheezes. She has no rales. She exhibits no tenderness.  Abdominal: Soft. Bowel sounds are normal. She exhibits no distension and  no mass. There is no tenderness. There is no rebound and no guarding. Hernia confirmed negative in the right inguinal area and confirmed negative in the left inguinal area.  Genitourinary: Rectum normal and vagina normal. Rectal exam shows no  external hemorrhoid, no internal hemorrhoid, no fissure, no mass, no tenderness, anal tone normal and guaiac negative stool. No breast swelling, tenderness, discharge or bleeding. No labial fusion. There is no rash, tenderness, lesion or injury on the right labia. There is no rash, tenderness, lesion or injury on the left labia. Uterus is not deviated, not enlarged, not fixed and not tender. Cervix exhibits no motion tenderness, no discharge and no friability. Right adnexum displays no mass, no tenderness and no fullness. Left adnexum displays no mass, no tenderness and no fullness. No erythema, tenderness or bleeding in the vagina. No foreign body in the vagina. No signs of injury around the vagina. No vaginal discharge found.  Musculoskeletal: Normal range of motion. She exhibits no edema, tenderness or deformity.  Lymphadenopathy:    She has no cervical adenopathy.       Right: No inguinal adenopathy present.       Left: No inguinal adenopathy present.  Neurological: She is oriented to person, place, and time.  Skin: Skin is warm and dry. No rash noted. She is not diaphoretic. No erythema. No pallor.  Psychiatric: She has a normal mood and affect. Her behavior is normal. Judgment and thought content normal.  Vitals reviewed.   Lab Results  Component Value Date   WBC 5.7 08/23/2016   HGB 13.6 08/23/2016   HCT 40.8 08/23/2016   PLT 381.0 08/23/2016   GLUCOSE 95 08/23/2016   CHOL 174 08/23/2016   TRIG 85.0 08/23/2016   HDL 58.70 08/23/2016   LDLCALC 98 08/23/2016   ALT 15 08/23/2016   AST 16 08/23/2016   NA 138 08/23/2016   K 4.1 08/23/2016   CL 105 08/23/2016   CREATININE 0.81 08/23/2016   BUN 12 08/23/2016   CO2 28 08/23/2016   TSH 1.65 08/23/2016   HGBA1C 5.2 02/03/2015    Dg Foot Complete Left  Result Date: 12/26/2015 CLINICAL DATA:  Injury fifth toe  3 weeks ago.  Initial encounter EXAM: LEFT FOOT - COMPLETE 3+ VIEW COMPARISON:  none FINDINGS: Nondisplaced fracture fifth  proximal phalanx. No other fracture or arthropathy. IMPRESSION: Nondisplaced fracture fifth proximal phalanx. Electronically Signed   By: Marlan Palauharles  Clark M.D.   On: 12/26/2015 16:38    Assessment & Plan:   [name] was seen today for annual exam.  Diagnoses and all orders for this visit:  Thiamine deficiency- her thiamine level is in the normal range, I've asked her to start a multivitamin -     CBC with Differential/Platelet; Future -     Vitamin B1; Future  Vitamin D deficiency- her vitamin D level is too low, I have asked her to start a vitamin D supplement -     VITAMIN D 25 Hydroxy (Vit-D Deficiency, Fractures); Future -     Cholecalciferol 2000 units TABS; Take 1 tablet (2,000 Units total) by mouth daily.  B12 deficiency- her vitamin B12 level is trending downwards so I've asked her to start parenteral B12 supplementation. -     CBC with Differential/Platelet; Future -     Vitamin B12; Future -     Folate; Future -     Cyanocobalamin 500 MCG/0.1ML SOLN; Place 0.1 mLs (500 mcg total) into the nose once a week.  Gastric bypass status  for obesity- as above -     IBC panel; Future -     Ferritin; Future -     Vitamin B6; Future -     Zinc; Future -     Cyanocobalamin 500 MCG/0.1ML SOLN; Place 0.1 mLs (500 mcg total) into the nose once a week. -     Cholecalciferol 2000 units TABS; Take 1 tablet (2,000 Units total) by mouth daily.  Gastroesophageal reflux disease without esophagitis  Routine general medical examination at a health care facility- exam completed, labs ordered and reviewed, vaccines reviewed, Pap completed, mammogram ordered, patient education material was given. -     Lipid panel; Future -     Comprehensive metabolic panel; Future -     TSH; Future -     HIV antibody; Future  Visit for screening mammogram -     MM DIGITAL SCREENING BILATERAL; Future  Pap smear for cervical cancer screening -     Cytology - PAP  Chronic midline low back pain without  sciatica -     cyclobenzaprine (FLEXERIL) 10 MG tablet; Take 1 tablet (10 mg total) by mouth 3 (three) times daily as needed for muscle spasms.  Chronic left-sided low back pain with left-sided sciatica   I have discontinued Ms. Gayden's HYDROcodone-homatropine. I am also having her start on Cyanocobalamin and Cholecalciferol. Additionally, I am having her maintain her topiramate and cyclobenzaprine.  Meds ordered this encounter  Medications  . Cyanocobalamin 500 MCG/0.1ML SOLN    Sig: Place 0.1 mLs (500 mcg total) into the nose once a week.    Dispense:  2.3 mL    Refill:  11  . Cholecalciferol 2000 units TABS    Sig: Take 1 tablet (2,000 Units total) by mouth daily.    Dispense:  90 tablet    Refill:  3  . cyclobenzaprine (FLEXERIL) 10 MG tablet    Sig: Take 1 tablet (10 mg total) by mouth 3 (three) times daily as needed for muscle spasms.    Dispense:  65 tablet    Refill:  2     Follow-up: Return if symptoms worsen or fail to improve.  Sanda Linger, MD

## 2016-08-23 NOTE — Progress Notes (Signed)
Pre visit review using our clinic review tool, if applicable. No additional management support is needed unless otherwise documented below in the visit note. 

## 2016-08-23 NOTE — Patient Instructions (Signed)
Preventive Care 18-39 Years, Female Preventive care refers to lifestyle choices and visits with your health care provider that can promote health and wellness. What does preventive care include?  A yearly physical exam. This is also called an annual well check.  Dental exams once or twice a year.  Routine eye exams. Ask your health care provider how often you should have your eyes checked.  Personal lifestyle choices, including:  Daily care of your teeth and gums.  Regular physical activity.  Eating a healthy diet.  Avoiding tobacco and drug use.  Limiting alcohol use.  Practicing safe sex.  Taking vitamin and mineral supplements as recommended by your health care provider. What happens during an annual well check? The services and screenings done by your health care provider during your annual well check will depend on your age, overall health, lifestyle risk factors, and family history of disease. Counseling  Your health care provider may ask you questions about your:  Alcohol use.  Tobacco use.  Drug use.  Emotional well-being.  Home and relationship well-being.  Sexual activity.  Eating habits.  Work and work environment.  Method of birth control.  Menstrual cycle.  Pregnancy history. Screening  You may have the following tests or measurements:  Height, weight, and BMI.  Diabetes screening. This is done by checking your blood sugar (glucose) after you have not eaten for a while (fasting).  Blood pressure.  Lipid and cholesterol levels. These may be checked every 5 years starting at age 20.  Skin check.  Hepatitis C blood test.  Hepatitis B blood test.  Sexually transmitted disease (STD) testing.  BRCA-related cancer screening. This may be done if you have a family history of breast, ovarian, tubal, or peritoneal cancers.  Pelvic exam and Pap test. This may be done every 3 years starting at age 21. Starting at age 30, this may be done every 5  years if you have a Pap test in combination with an HPV test. Discuss your test results, treatment options, and if necessary, the need for more tests with your health care provider. Vaccines  Your health care provider may recommend certain vaccines, such as:  Influenza vaccine. This is recommended every year.  Tetanus, diphtheria, and acellular pertussis (Tdap, Td) vaccine. You may need a Td booster every 10 years.  Varicella vaccine. You may need this if you have not been vaccinated.  HPV vaccine. If you are 26 or younger, you may need three doses over 6 months.  Measles, mumps, and rubella (MMR) vaccine. You may need at least one dose of MMR. You may also need a second dose.  Pneumococcal 13-valent conjugate (PCV13) vaccine. You may need this if you have certain conditions and were not previously vaccinated.  Pneumococcal polysaccharide (PPSV23) vaccine. You may need one or two doses if you smoke cigarettes or if you have certain conditions.  Meningococcal vaccine. One dose is recommended if you are age 19-21 years and a first-year college student living in a residence hall, or if you have one of several medical conditions. You may also need additional booster doses.  Hepatitis A vaccine. You may need this if you have certain conditions or if you travel or work in places where you may be exposed to hepatitis A.  Hepatitis B vaccine. You may need this if you have certain conditions or if you travel or work in places where you may be exposed to hepatitis B.  Haemophilus influenzae type b (Hib) vaccine. You may need this   if you have certain risk factors. Talk to your health care provider about which screenings and vaccines you need and how often you need them. This information is not intended to replace advice given to you by your health care provider. Make sure you discuss any questions you have with your health care provider. Document Released: 09/21/2001 Document Revised: 04/14/2016  Document Reviewed: 05/27/2015 Elsevier Interactive Patient Education  2017 [employer].

## 2016-08-24 DIAGNOSIS — M545 Low back pain, unspecified: Secondary | ICD-10-CM | POA: Insufficient documentation

## 2016-08-24 MED ORDER — CYCLOBENZAPRINE HCL 10 MG PO TABS: 10.0000 mg | ORAL_TABLET | Freq: Three times a day (TID) | ORAL | 2 refills | Status: DC | PRN

## 2016-08-25 LAB — ZINC: Zinc: 99 ug/dL (ref 60–130)

## 2016-08-26 LAB — CYTOLOGY - PAP
CHLAMYDIA, DNA PROBE: NEGATIVE
DIAGNOSIS: NEGATIVE
HPV: NOT DETECTED
Neisseria Gonorrhea: NEGATIVE

## 2016-08-27 ENCOUNTER — Encounter: Payer: Self-pay | Admitting: Internal Medicine

## 2016-08-27 LAB — VITAMIN B6: VITAMIN B6: 16.2 ng/mL (ref 2.1–21.7)

## 2016-08-28 LAB — VITAMIN B1: Vitamin B1 (Thiamine): 12 nmol/L (ref 8–30)

## 2016-08-31 ENCOUNTER — Ambulatory Visit (INDEPENDENT_AMBULATORY_CARE_PROVIDER_SITE_OTHER): Payer: BC Managed Care – PPO | Admitting: General Practice

## 2016-08-31 ENCOUNTER — Encounter: Payer: Self-pay | Admitting: General Practice

## 2016-08-31 DIAGNOSIS — E538 Deficiency of other specified B group vitamins: Secondary | ICD-10-CM | POA: Diagnosis not present

## 2016-08-31 MED ORDER — CYANOCOBALAMIN 1000 MCG/ML IJ SOLN
1000.0000 ug | Freq: Once | INTRAMUSCULAR | Status: AC
Start: 2016-08-31 — End: 2016-08-31
  Administered 2016-08-31: 1000 ug via INTRAMUSCULAR

## 2016-09-01 ENCOUNTER — Telehealth: Payer: Self-pay

## 2016-09-01 NOTE — Telephone Encounter (Signed)
[facility] LVM for her to call back

## 2016-09-01 NOTE — Telephone Encounter (Signed)
LVM letting pt know that her NEW FMLA papers have been faxed.

## 2016-09-09 ENCOUNTER — Ambulatory Visit (INDEPENDENT_AMBULATORY_CARE_PROVIDER_SITE_OTHER): Payer: BC Managed Care – PPO | Admitting: General Practice

## 2016-09-09 ENCOUNTER — Ambulatory Visit: Payer: Self-pay

## 2016-09-09 ENCOUNTER — Encounter: Payer: Self-pay | Admitting: General Practice

## 2016-09-09 DIAGNOSIS — E538 Deficiency of other specified B group vitamins: Secondary | ICD-10-CM

## 2016-09-09 MED ORDER — CYANOCOBALAMIN 1000 MCG/ML IJ SOLN
1000.0000 ug | Freq: Once | INTRAMUSCULAR | Status: AC
  Administered 2016-09-09: 1000 ug via INTRAMUSCULAR

## 2016-09-10 ENCOUNTER — Ambulatory Visit: Payer: Self-pay

## 2016-09-17 ENCOUNTER — Other Ambulatory Visit: Payer: Self-pay | Admitting: Internal Medicine

## 2016-09-17 ENCOUNTER — Ambulatory Visit
Admission: RE | Admit: 2016-09-17 | Discharge: 2016-09-17 | Disposition: A | Discharge status: Home / Self Care | Payer: BC Managed Care – PPO | Source: Ambulatory Visit | Attending: Internal Medicine | Admitting: Internal Medicine

## 2016-09-17 DIAGNOSIS — Z1231 Encounter for screening mammogram for malignant neoplasm of breast: Secondary | ICD-10-CM

## 2016-09-17 DIAGNOSIS — N63 Unspecified lump in unspecified breast: Secondary | ICD-10-CM

## 2016-09-21 ENCOUNTER — Ambulatory Visit
Admission: RE | Admit: 2016-09-21 | Discharge: 2016-09-21 | Disposition: A | Discharge status: Home / Self Care | Payer: BC Managed Care – PPO | Source: Ambulatory Visit | Attending: Internal Medicine | Admitting: Internal Medicine

## 2016-09-21 ENCOUNTER — Other Ambulatory Visit: Payer: Self-pay | Admitting: Internal Medicine

## 2016-09-21 DIAGNOSIS — N6001 Solitary cyst of right breast: Secondary | ICD-10-CM

## 2016-09-21 DIAGNOSIS — N63 Unspecified lump in unspecified breast: Secondary | ICD-10-CM

## 2016-09-21 LAB — HM MAMMOGRAPHY

## 2016-09-23 ENCOUNTER — Ambulatory Visit
Admission: RE | Admit: 2016-09-23 | Discharge: 2016-09-23 | Disposition: A | Discharge status: Home / Self Care | Payer: BC Managed Care – PPO | Source: Ambulatory Visit | Attending: Internal Medicine | Admitting: Internal Medicine

## 2016-09-23 DIAGNOSIS — N6001 Solitary cyst of right breast: Secondary | ICD-10-CM

## 2016-09-24 ENCOUNTER — Encounter: Payer: Self-pay | Admitting: Internal Medicine

## 2016-10-05 ENCOUNTER — Encounter: Payer: Self-pay | Admitting: Internal Medicine

## 2016-10-05 ENCOUNTER — Other Ambulatory Visit: Payer: Self-pay | Admitting: Internal Medicine

## 2016-10-05 DIAGNOSIS — M25562 Pain in left knee: Principal | ICD-10-CM

## 2016-10-05 DIAGNOSIS — M25561 Pain in right knee: Principal | ICD-10-CM

## 2016-10-05 DIAGNOSIS — G8929 Other chronic pain: Secondary | ICD-10-CM

## 2016-11-08 NOTE — Progress Notes (Signed)
[name] [address] [place], [place] [address] Phone: [phone] Subjective:    I'm seeing this patient by the request  of:  Sanda Linger, MD   CC: bilateral knee pain  BJY:NWGNFAOZHY  Robyn Bryant is a 41 y.o. female coming in with complaint of bilateral knee pain. Patient extension and pain for multiple months. Patient describes pain as more of a dull, throbbing aching sensation that seems anterior aspect of the knees. Patient denies any numbness or tingling. Some mild swelling. Seems to stay localized. Worse after sitting a long amount of time or going up or down steps. Denies any instability or given out on her. Rates the severity of pain a 6 out of 10. Tries to avoid oral anti-inflammatories secondary to history of gastric bypass.   2011 xrays normal.  Past Medical History:  Diagnosis Date  . Asthma    as teenager, none now   Past Surgical History:  Procedure Laterality Date  . BREAST BIOPSY Left   . BREAST CYST ASPIRATION    . CHOLECYSTECTOMY N/A 09/07/2013   Procedure: LAPAROSCOPIC CHOLECYSTECTOMY WITH INTRAOPERATIVE CHOLANGIOGRAM;  Surgeon: Mariella Saa, MD;  Location: [facility];  Service: General;  Laterality: N/A;  . ESOPHAGOGASTRODUODENOSCOPY N/A 09/06/2013   Procedure: ESOPHAGOGASTRODUODENOSCOPY (EGD);  Surgeon: Kandis Cocking, MD;  Location: [facility];  Service: General;  Laterality: N/A;  . LAPAROSCOPIC GASTRIC SLEEVE RESECTION N/A 08/08/2013   Procedure: LAPAROSCOPIC GASTRIC SLEEVE RESECTION UPPER ENDOSCOPY;  Surgeon: Mariella Saa, MD;  Location: [facility];  Service: General;  Laterality: N/A;  . TUBAL LIGATION  2001  . UPPER GI ENDOSCOPY  08/08/2013   Procedure: UPPER GI ENDOSCOPY;  Surgeon: Mariella Saa, MD;  Location: [facility];  Service: General;;   Social History   Social History  . Marital status: Married    Spouse name: N/A  . Number of children: N/A  . Years of education: N/A   Social History Main  Topics  . Smoking status: Never Smoker  . Smokeless tobacco: Never Used  . Alcohol use No  . Drug use: No  . Sexual activity: Yes    Birth control/ protection: Surgical   Other Topics Concern  . None   Social History Narrative  . None   Allergies  Allergen Reactions  . Morphine And Related     Itching.  Pt can tolerate with Benadryl.   . Latex Hives  . Dilaudid [Hydromorphone] Itching    Pt can tolerate with Benadryl.    Family History  Problem Relation Age of Onset  . Diabetes Mother   . Hypertension Mother   . Alcohol abuse Father   . Asthma Sister     Past medical history, social, surgical and family history all reviewed in electronic medical record.  No pertanent information unless stated regarding to the chief complaint.   Review of Systems:Review of systems updated and as accurate as of 11/09/16  No headache, visual changes, nausea, vomiting, diarrhea, constipation, dizziness, abdominal pain, skin rash, fevers, chills, night sweats, weight loss, swollen lymph nodes, body aches, joint swelling, chest pain, shortness of breath, mood changes.  Positive muscle aches  Objective  Blood pressure 116/84, pulse 68, height 5' 4.5" (1.638 m), weight 203 lb (92.1 kg). Systems examined below as of 11/09/16   General: No apparent distress alert and oriented x3 mood and affect normal, dressed appropriately.  HEENT: Pupils equal, extraocular movements intact  Respiratory: Patient's speak in full sentences and does not appear  short of breath  Cardiovascular: No lower extremity edema, non tender, no erythema  Skin: Warm dry intact with no signs of infection or rash on extremities or on axial skeleton.  Abdomen: Soft nontender  Neuro: Cranial nerves II through XII are intact, neurovascularly intact in all extremities with 2+ DTRs and 2+ pulses.  Lymph: No lymphadenopathy of posterior or anterior cervical chain or axillae bilaterally.  Gait Mild antalgic gait MSK:  Non tender with  full range of motion and good stability and symmetric strength and tone of shoulders, elbows, wrist, hip and ankles bilaterally.  Knee:Bilateral Lateral tilt of the kneecaps noted I'll tenderness to palpation over the anterior lateral aspect of the knee ROM full in flexion and extension and lower leg rotation. Ligaments with solid consistent endpoints including ACL, PCL, LCL, MCL. Negative Mcmurray's, Apley's, and Thessalonian tests. painful patellar compression. Patellar glide with moderate crepitus. Patellar and quadriceps tendons unremarkable. Hamstring and quadriceps strength is normal.    MSK US performed of: Bilateral knee This study was ordered, performed, and interpreted by Terrilee Files D.O.  Knee: Patient does have mild narrowing of the patellofemoral joint bilaterally with hypoechoic changes surrounding the area. No effusion noted. Mild narrowing of the medial joint space right greater than left as well. Otherwise fairly unremarkable.  IMPRESSION:  Mild patellofemoral arthritis with patellofemoral syndrome   Procedure note 97110; 15 minutes spent for Therapeutic exercises as stated in above notes.  This included exercises focusing on stretching, strengthening, with significant focus on eccentric aspects.   Patellofemoral Syndrome  Reviewed anatomy using anatomical model and how PFS occurs.  Given rehab exercises handout for VMO, hip abductors, core, entire kinetic chain including proprioception exercises including cone touches, step downs, hip elevations and turn outs.  Could benefit from PT, regular exercise, upright biking, and a PFS knee brace to assist with tracking abnormalities. Proper technique shown and discussed handout in great detail with ATC.  All questions were discussed and answered.      Impression and Recommendations:     This case required medical decision making of moderate complexity.      Note: This dictation was prepared with Dragon dictation  along with smaller phrase technology. Any transcriptional errors that result from this process are unintentional.

## 2016-11-09 ENCOUNTER — Encounter: Payer: Self-pay | Admitting: Family Medicine

## 2016-11-09 ENCOUNTER — Ambulatory Visit: Payer: Self-pay

## 2016-11-09 ENCOUNTER — Ambulatory Visit (INDEPENDENT_AMBULATORY_CARE_PROVIDER_SITE_OTHER): Payer: BC Managed Care – PPO | Admitting: Family Medicine

## 2016-11-09 VITALS — BP 116/84 | HR 68 | Ht 64.5 in | Wt 203.0 lb

## 2016-11-09 DIAGNOSIS — E538 Deficiency of other specified B group vitamins: Secondary | ICD-10-CM | POA: Diagnosis not present

## 2016-11-09 DIAGNOSIS — E559 Vitamin D deficiency, unspecified: Secondary | ICD-10-CM

## 2016-11-09 DIAGNOSIS — M222X1 Patellofemoral disorders, right knee: Secondary | ICD-10-CM | POA: Diagnosis not present

## 2016-11-09 DIAGNOSIS — M25569 Pain in unspecified knee: Secondary | ICD-10-CM

## 2016-11-09 DIAGNOSIS — M222X2 Patellofemoral disorders, left knee: Secondary | ICD-10-CM

## 2016-11-09 MED ORDER — VITAMIN D (ERGOCALCIFEROL) 1.25 MG (50000 UNIT) PO CAPS: 50000.0000 [IU] | ORAL_CAPSULE | ORAL | 0 refills | Status: DC

## 2016-11-09 MED ORDER — DICLOFENAC SODIUM 2 % TD SOLN: 2.0000 "application " | Freq: Two times a day (BID) | TRANSDERMAL | 3 refills | Status: DC

## 2016-11-09 MED ORDER — CYANOCOBALAMIN 1000 MCG/ML IJ SOLN
1000.0000 ug | Freq: Once | INTRAMUSCULAR | Status: AC
  Administered 2016-11-09: 1000 ug via INTRAMUSCULAR

## 2016-11-09 NOTE — Assessment & Plan Note (Signed)
Bilateral patellofemoral syndrome. Patient has dense consider a weight loss. This is good for patient but I do think that she is not absorbing vitamin D. Started once weekly vitamin D though help with muscle strength and endurance. Patient work with Event organiser to learn home exercises. Topical anti-inflammatory's prescribed as well as will avoid oral anti-inflammatories secondary to patient's gastric bypass surgery. Patient work back in 4-6 weeks for further evaluation and treatment.

## 2016-11-09 NOTE — Patient Instructions (Addendum)
Good to see you  Robyn Bryant is your friend. Ice 20 minutes 2 times daily. Usually after activity and before bed. Exercises 3 times a week.  pennsaid pinkie amount topically 2 times daily as needed.  Once weekly vitamin D to help endurance and strength Biking or elliptical for working out See me again in 4-6 weeks.

## 2016-11-09 NOTE — Assessment & Plan Note (Signed)
Started once weekly vitamin D secondary to absorption difficulties

## 2016-11-09 NOTE — Progress Notes (Signed)
Patient was seen today with Dr. Katrinka Blazing. Requested B12 injection. Injected into left Deltoid. Patient tolerated injection welll

## 2016-11-09 NOTE — Addendum Note (Signed)
Addended by: Osa Craver on: 11/09/2016 03:40 PM   Modules accepted: Orders

## 2017-01-31 ENCOUNTER — Other Ambulatory Visit: Payer: Self-pay | Admitting: Family Medicine

## 2017-03-09 ENCOUNTER — Encounter (HOSPITAL_COMMUNITY): Payer: Self-pay

## 2017-04-19 ENCOUNTER — Ambulatory Visit (INDEPENDENT_AMBULATORY_CARE_PROVIDER_SITE_OTHER): Payer: Self-pay | Admitting: General Practice

## 2017-04-19 DIAGNOSIS — Z23 Encounter for immunization: Secondary | ICD-10-CM

## 2017-07-06 ENCOUNTER — Encounter: Payer: BLUE CROSS/BLUE SHIELD | Attending: General Surgery | Admitting: Registered"

## 2017-07-06 ENCOUNTER — Encounter: Payer: Self-pay | Admitting: Registered"

## 2017-07-06 DIAGNOSIS — Z9884 Bariatric surgery status: Secondary | ICD-10-CM | POA: Diagnosis present

## 2017-07-06 DIAGNOSIS — E669 Obesity, unspecified: Secondary | ICD-10-CM

## 2017-07-06 NOTE — Patient Instructions (Addendum)
Goals:  Follow Bariatric Surgery Specialized Post-Op Diet (ideas)  Aim for maximum of 15 grams of carbs per meal/10-15 grams per snack  Avoid white starches and starchy veggies (potatoes, peas, corn, etc)  Avoid sweetened drinks  Eat 3-6 small meals/snacks, every 3-5 hrs  No meal skipping  Increase lean protein foods to meet 60-80g goal  Always have a protein source with carbs  Increase fluid intake to 64oz +  Aim for >30 min of physical activity daily  - Decrease caffeine intake, fried foods, and carbonated beverages.   - Practice chewing at least 30 times per bite or to applesauce consistency.  - Get bariatric-specific multivitamins.   - Try Nature's Twist as a fluid option.   - Keep up the great work with meeting fluid and protein goals.
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
## 2017-08-15 ENCOUNTER — Encounter: Payer: Self-pay | Admitting: Family Medicine

## 2017-08-15 ENCOUNTER — Encounter: Payer: Self-pay | Admitting: Internal Medicine

## 2017-08-23 ENCOUNTER — Ambulatory Visit: Payer: Self-pay | Admitting: Registered"

## 2017-08-29 NOTE — Progress Notes (Deleted)
[name] [address] [place], [place] [address] Phone: [phone] Subjective:    I'm seeing this patient by the request  of:  Etta Grandchild, MD   CC: Bilateral knee pain  BJY:NWGNFAOZHY  Robyn Bryant is a 42 y.o. female coming in with complaint of bilateral knee pain.  Seen 9 months ago and was diagnosed with patellofemoral syndrome.  Try to do home exercises, icing regimen, told which activities to do.  Patient states      Past Medical History:  Diagnosis Date  . Asthma    as teenager, none now   Past Surgical History:  Procedure Laterality Date  . BREAST BIOPSY Left   . BREAST CYST ASPIRATION    . CHOLECYSTECTOMY N/A 09/07/2013   Procedure: LAPAROSCOPIC CHOLECYSTECTOMY WITH INTRAOPERATIVE CHOLANGIOGRAM;  Surgeon: Mariella Saa, MD;  Location: [facility];  Service: General;  Laterality: N/A;  . ESOPHAGOGASTRODUODENOSCOPY N/A 09/06/2013   Procedure: ESOPHAGOGASTRODUODENOSCOPY (EGD);  Surgeon: Kandis Cocking, MD;  Location: [facility];  Service: General;  Laterality: N/A;  . LAPAROSCOPIC GASTRIC SLEEVE RESECTION N/A 08/08/2013   Procedure: LAPAROSCOPIC GASTRIC SLEEVE RESECTION UPPER ENDOSCOPY;  Surgeon: Mariella Saa, MD;  Location: [facility];  Service: General;  Laterality: N/A;  . TUBAL LIGATION  2001  . UPPER GI ENDOSCOPY  08/08/2013   Procedure: UPPER GI ENDOSCOPY;  Surgeon: Mariella Saa, MD;  Location: [facility];  Service: General;;   Social History   Socioeconomic History  . Marital status: Married    Spouse name: Not on file  . Number of children: Not on file  . Years of education: Not on file  . Highest education level: Not on file  Social Needs  . Financial resource strain: Not on file  . Food insecurity - worry: Never true  . Food insecurity - inability: Never true  . Transportation needs - medical: Not on file  . Transportation needs - non-medical: Not on file  Occupational History  . Not on file  Tobacco  Use  . Smoking status: Never Smoker  . Smokeless tobacco: Never Used  Substance and Sexual Activity  . Alcohol use: No  . Drug use: No  . Sexual activity: Yes    Birth control/protection: Surgical  Other Topics Concern  . Not on file  Social History Narrative  . Not on file   Allergies  Allergen Reactions  . Morphine And Related     Itching.  Pt can tolerate with Benadryl.   . Latex Hives  . Dilaudid [Hydromorphone] Itching    Pt can tolerate with Benadryl.    Family History  Problem Relation Age of Onset  . Diabetes Mother   . Hypertension Mother   . Alcohol abuse Father   . Asthma Sister      Past medical history, social, surgical and family history all reviewed in electronic medical record.  No pertanent information unless stated regarding to the chief complaint.   Review of Systems:Review of systems updated and as accurate as of 08/29/17  No headache, visual changes, nausea, vomiting, diarrhea, constipation, dizziness, abdominal pain, skin rash, fevers, chills, night sweats, weight loss, swollen lymph nodes, body aches, joint swelling, muscle aches, chest pain, shortness of breath, mood changes.   Objective  There were no vitals taken for this visit. Systems examined below as of 08/29/17   General: No apparent distress alert and oriented x3 mood and affect normal, dressed appropriately.  HEENT: Pupils equal, extraocular movements intact  Respiratory:  Patient's speak in full sentences and does not appear short of breath  Cardiovascular: No lower extremity edema, non tender, no erythema  Skin: Warm dry intact with no signs of infection or rash on extremities or on axial skeleton.  Abdomen: Soft nontender  Neuro: Cranial nerves II through XII are intact, neurovascularly intact in all extremities with 2+ DTRs and 2+ pulses.  Lymph: No lymphadenopathy of posterior or anterior cervical chain or axillae bilaterally.  Gait normal with good balance and coordination.  MSK:   Non tender with full range of motion and good stability and symmetric strength and tone of shoulders, elbows, wrist, hip, knee and ankles bilaterally.     Impression and Recommendations:     This case required medical decision making of moderate complexity.      Note: This dictation was prepared with Dragon dictation along with smaller phrase technology. Any transcriptional errors that result from this process are unintentional.

## 2017-08-30 ENCOUNTER — Ambulatory Visit: Payer: Self-pay | Admitting: Family Medicine

## 2017-08-31 ENCOUNTER — Ambulatory Visit: Payer: Self-pay | Admitting: Registered"

## 2017-09-01 ENCOUNTER — Encounter: Payer: Self-pay | Admitting: Internal Medicine

## 2017-09-15 ENCOUNTER — Other Ambulatory Visit: Payer: Self-pay | Admitting: Internal Medicine

## 2017-09-15 DIAGNOSIS — Z1231 Encounter for screening mammogram for malignant neoplasm of breast: Secondary | ICD-10-CM

## 2017-10-06 ENCOUNTER — Ambulatory Visit
Admission: RE | Admit: 2017-10-06 | Discharge: 2017-10-06 | Disposition: A | Discharge status: Home / Self Care | Payer: BLUE CROSS/BLUE SHIELD | Source: Ambulatory Visit | Attending: Internal Medicine | Admitting: Internal Medicine

## 2017-10-06 DIAGNOSIS — Z1231 Encounter for screening mammogram for malignant neoplasm of breast: Secondary | ICD-10-CM

## 2017-10-07 LAB — HM MAMMOGRAPHY

## 2017-10-10 ENCOUNTER — Other Ambulatory Visit: Payer: Self-pay | Admitting: Family Medicine

## 2017-10-10 MED ORDER — VITAMIN D (ERGOCALCIFEROL) 1.25 MG (50000 UNIT) PO CAPS: 50000.0000 [IU] | ORAL_CAPSULE | ORAL | 0 refills | Status: DC

## 2017-10-12 ENCOUNTER — Ambulatory Visit: Payer: Self-pay | Admitting: Surgery

## 2017-10-14 ENCOUNTER — Other Ambulatory Visit: Payer: Self-pay

## 2017-10-14 ENCOUNTER — Encounter (HOSPITAL_COMMUNITY): Payer: Self-pay

## 2017-10-23 NOTE — H&P (Signed)
Robyn Bryant  [facility] Patient #: [id number] DOB: 06/08/1976 Single / Language: [place] / Race: Black or African American Female  History of Present Illness   The patient is a 42 year old female presenting status-post bariatric surgery.  Her PCP is Dr. Leitha Bryant. [name]  Patient returns for long-term follow-up with history of laparoscopic sleeve gastrectomy in 08/08/2013. She was endoscoped by Dr. Ezzard Bryant early after surgery on 09/06/2013.  Her anatomy at that time was normal post op sleeve gastrectomy.  She followed up for about 6 months but then was lost to follow-up she states due to insurance issues. I saw her 6 months ago after a long absence with a chief complaint of reflux. She had lost initially about 100 pounds and has had about a 50 pound regain. The reflux is more recent in onset. It has been fairly severe. We started her on Protonix couple of months ago and she has not had any improvement and in fact feels it is getting worse. No nausea or vomiting or abdominal pain. Preoperative upper GI series showed minimal reflux and no evidence of hiatal hernia.  Past Medical History: 1.  Lap chole by Dr. Johna Bryant - 09/07/2013 2.  History of knee pain  Allergies Robyn Bryant(Robyn Bryant, [place]; 10/07/2017 11:10 AM) Morphine Sulfate *ANALGESICS - OPIOID*  Latex  Dilaudid *ANALGESICS - OPIOID*  Allergies Reconciled   Medication History Robyn Bryant(Robyn Bryant, RMA; 10/07/2017 11:11 AM) Protonix (40MG  Tablet DR, 1 (one) Oral daily, Taken starting 09/08/2017) Active. Trokendi XR (200MG  Capsule ER 24HR, Oral) Active. Medications Reconciled  Vitals Robyn Bryant(Robyn Bryant RMA; 10/07/2017 11:10 AM) 10/07/2017 11:10 AM Weight: 204 lb Height: 64.5in Body Surface Area: 1.98 m Body Mass Index: 34.48 kg/m  Temp.: 98.1F  Pulse: 91 (Regular)  BP: 125/78 (Sitting, Left Arm, Standard)   Physical Exam: General: No acute distress Lungs: Clear bilaterally without wheezing Abdomen: Soft and  nontender and nondistended. Well-healed laparoscopic incisions.   Assessment & Plan  1.  S/P LAPAROSCOPIC SLEEVE GASTRECTOMY (Z98.84)  08/08/2018 - B. Hoxworth  Impression: Approaching 4 years following laparoscopic sleeve gastrectomy. She has had some weight regain but still has 47 pound weight loss from surgery. I would like her to see the dietitian for refresher course.  Main problem currently is reflux which has developed really only recently. This has not responded to Protonix. I'm going to ask Dr. Ezzard Bryant to go ahead with endoscopy to evaluate her esophagus and look for any evidence of hernia or stricture. We will double up her Protonix. I discussed with her the possibility that she could need revisional surgery to correct her reflux. I'll see her back following her endoscopy.  Current Plans  Upper endoscopy.  Follow up with us in the office in 2 months.   2.  GERD 3.  Obesity  Robyn Kinavid Teresita Fanton, MD, [facility] Pager: [phone] Office phone:  [phone]

## 2017-10-24 ENCOUNTER — Ambulatory Visit (HOSPITAL_COMMUNITY): Payer: BLUE CROSS/BLUE SHIELD | Admitting: Certified Registered Nurse Anesthetist

## 2017-10-24 ENCOUNTER — Encounter (HOSPITAL_COMMUNITY)
Admission: RE | Disposition: A | Discharge status: Home / Self Care | Payer: Self-pay | Source: Ambulatory Visit | Attending: Surgery

## 2017-10-24 ENCOUNTER — Ambulatory Visit (HOSPITAL_COMMUNITY)
Admission: RE | Admit: 2017-10-24 | Discharge: 2017-10-24 | Disposition: A | Discharge status: Home / Self Care | Payer: BLUE CROSS/BLUE SHIELD | Source: Ambulatory Visit | Attending: Surgery | Admitting: Surgery

## 2017-10-24 ENCOUNTER — Other Ambulatory Visit: Payer: Self-pay

## 2017-10-24 ENCOUNTER — Encounter (HOSPITAL_COMMUNITY): Payer: Self-pay | Admitting: *Deleted

## 2017-10-24 DIAGNOSIS — E669 Obesity, unspecified: Secondary | ICD-10-CM | POA: Insufficient documentation

## 2017-10-24 DIAGNOSIS — K219 Gastro-esophageal reflux disease without esophagitis: Secondary | ICD-10-CM | POA: Insufficient documentation

## 2017-10-24 DIAGNOSIS — Z79899 Other long term (current) drug therapy: Secondary | ICD-10-CM | POA: Insufficient documentation

## 2017-10-24 DIAGNOSIS — Z6834 Body mass index (BMI) 34.0-34.9, adult: Secondary | ICD-10-CM | POA: Diagnosis not present

## 2017-10-24 DIAGNOSIS — Z9884 Bariatric surgery status: Secondary | ICD-10-CM | POA: Insufficient documentation

## 2017-10-24 HISTORY — PX: ESOPHAGOGASTRODUODENOSCOPY (EGD) WITH PROPOFOL: SHX5813

## 2017-10-24 SURGERY — ESOPHAGOGASTRODUODENOSCOPY (EGD) WITH PROPOFOL
Anesthesia: General

## 2017-10-24 MED ORDER — MIDAZOLAM HCL 5 MG/5ML IJ SOLN
INTRAMUSCULAR | Status: DC | PRN
  Administered 2017-10-24: 1 mg via INTRAVENOUS

## 2017-10-24 MED ORDER — LACTATED RINGERS IV SOLN
INTRAVENOUS | Status: DC
  Administered 2017-10-24: 11:00:00 via INTRAVENOUS

## 2017-10-24 MED ORDER — FENTANYL CITRATE (PF) 100 MCG/2ML IJ SOLN
INTRAMUSCULAR | Status: AC
  Filled 2017-10-24: qty 2

## 2017-10-24 MED ORDER — ONDANSETRON HCL 4 MG/2ML IJ SOLN
INTRAMUSCULAR | Status: DC | PRN
  Administered 2017-10-24: 4 mg via INTRAVENOUS

## 2017-10-24 MED ORDER — PROPOFOL 10 MG/ML IV BOLUS
INTRAVENOUS | Status: AC
  Filled 2017-10-24: qty 20

## 2017-10-24 MED ORDER — MIDAZOLAM HCL 2 MG/2ML IJ SOLN
INTRAMUSCULAR | Status: AC
  Filled 2017-10-24: qty 2

## 2017-10-24 MED ORDER — PROPOFOL 10 MG/ML IV BOLUS
INTRAVENOUS | Status: DC | PRN
  Administered 2017-10-24: 200 mg via INTRAVENOUS

## 2017-10-24 MED ORDER — SUCCINYLCHOLINE CHLORIDE 200 MG/10ML IV SOSY
PREFILLED_SYRINGE | INTRAVENOUS | Status: DC | PRN
  Administered 2017-10-24: 120 mg via INTRAVENOUS

## 2017-10-24 MED ORDER — FENTANYL CITRATE (PF) 100 MCG/2ML IJ SOLN
INTRAMUSCULAR | Status: DC | PRN
  Administered 2017-10-24 (×2): 50 ug via INTRAVENOUS

## 2017-10-24 MED ORDER — LIDOCAINE 2% (20 MG/ML) 5 ML SYRINGE
INTRAMUSCULAR | Status: DC | PRN
  Administered 2017-10-24: 100 mg via INTRAVENOUS

## 2017-10-24 MED ORDER — SODIUM CHLORIDE 0.9 % IV SOLN: INTRAVENOUS | Status: DC

## 2017-10-24 SURGICAL SUPPLY — 15 items

## 2017-10-24 NOTE — Discharge Instructions (Signed)
[facility] - DISCHARGE INSTRUCTIONS TO PATIENT  Activity:  Driving - May drive tomorrow   Lifting - No limit  Diet:  Post op sleeve diet  Follow up appointment:  Call Dr. Jamse MeadHoxworth's office [facility] at [phone] for an appointment in 3 to 4 weeks.           Our office will arrange and upper GI x-ray to evaluate the pouch.  If you have not heard from anyone in 3 days, call our office and speak to Dr. Jamse MeadHoxworth's nurse.  Medications and dosages:  Resume your home medications.  Call Dr. Johna SheriffHoxworth or his office  [phone]) if you have:  Persistent nausea and vomiting,  Difficulty breathing, headache or visual disturbances,  Any other questions or concerns you may have after discharge.  In an emergency, call 911 or go to an Emergency Department at a nearby hospital.  Esophagogastroduodenoscopy, Care After Refer to this sheet in the next few weeks. These instructions provide you with information about caring for yourself after your procedure. Your health care provider may also give you more specific instructions. Your treatment has been planned according to current medical practices, but problems sometimes occur. Call your health care provider if you have any problems or questions after your procedure. What can I expect after the procedure? After the procedure, it is common to have:  A sore throat.  Nausea.  Bloating.  Dizziness.  Fatigue.  Follow these instructions at home:  Do not eat or drink anything until the numbing medicine (local anesthetic) has worn off and your gag reflex has returned. You will know that the local anesthetic has worn off when you can swallow comfortably.  Do not drive for 24 hours if you received a medicine to help you relax (sedative).  If your health care provider took a tissue sample for testing during the procedure, make sure to get your test results. This is your responsibility. Ask your health care provider or  the department performing the test when your results will be ready.  Keep all follow-up visits as told by your health care provider. This is important. Contact a health care provider if:  You cannot stop coughing.  You are not urinating.  You are urinating less than usual. Get help right away if:  You have trouble swallowing.  You cannot eat or drink.  You have throat or chest pain that gets worse.  You are dizzy or light-headed.  You faint.  You have nausea or vomiting.  You have chills.  You have a fever.  You have severe abdominal pain.  You have black, tarry, or bloody stools. This information is not intended to replace advice given to you by your health care provider. Make sure you discuss any questions you have with your health care provider. Document Released: 07/12/2012 Document Revised: 01/01/2016 Document Reviewed: 06/19/2015 Elsevier Interactive Patient Education  [employer]2018 [employer]

## 2017-10-24 NOTE — Anesthesia Preprocedure Evaluation (Addendum)
Anesthesia Evaluation  Patient identified by MRN, date of birth, ID band Patient awake    Reviewed: Allergy & Precautions, NPO status , Patient's Chart, lab work & pertinent test results  Airway Mallampati: I  TM Distance: >3 FB Neck ROM: Full    Dental  (+) Dental Advisory Given, Partial Lower   Pulmonary neg pulmonary ROS,  Childhood asthma   Pulmonary exam normal breath sounds clear to auscultation       Cardiovascular negative cardio ROS Normal cardiovascular exam Rhythm:Regular Rate:Normal     Neuro/Psych  Headaches, negative psych ROS   GI/Hepatic Neg liver ROS, GERD  Medicated and Poorly Controlled,S/p gastric sleeve   Endo/Other  Obesity  Renal/GU negative Renal ROS  negative genitourinary   Musculoskeletal negative musculoskeletal ROS (+)   Abdominal   Peds  Hematology negative hematology ROS (+)   Anesthesia Other Findings   Reproductive/Obstetrics                           Anesthesia Physical Anesthesia Plan  ASA: II  Anesthesia Plan: General   Post-op Pain Management:    Induction: Intravenous and Rapid sequence  PONV Risk Score and Plan: Treatment may vary due to age or medical condition, Ondansetron and Midazolam  Airway Management Planned: Oral ETT  Additional Equipment: None  Intra-op Plan:   Post-operative Plan: Extubation in OR  Informed Consent: I have reviewed the patients History and Physical, chart, labs and discussed the procedure including the risks, benefits and alternatives for the proposed anesthesia with the patient or authorized representative who has indicated his/her understanding and acceptance.   Dental advisory given  Plan Discussed with: CRNA and Anesthesiologist  Anesthesia Plan Comments:         Anesthesia Quick Evaluation

## 2017-10-24 NOTE — Anesthesia Postprocedure Evaluation (Signed)
Anesthesia Post Note  Patient: Robyn KocherJessica Conlee  Procedure(s) Performed: ESOPHAGOGASTRODUODENOSCOPY (EGD) WITH PROPOFOL (N/A )     Patient location during evaluation: PACU Anesthesia Type: General Level of consciousness: awake and alert Pain management: pain level controlled Vital Signs Assessment: post-procedure vital signs reviewed and stable Respiratory status: spontaneous breathing, nonlabored ventilation and respiratory function stable Cardiovascular status: blood pressure returned to baseline and stable Postop Assessment: no apparent nausea or vomiting Anesthetic complications: no    Last Vitals:  Vitals:   10/24/17 1315 10/24/17 1320  BP: 107/64 97/64  Pulse: 65 60  Resp: 18 18  Temp:    SpO2: 100% 100%    Last Pain:  Vitals:   10/24/17 1305  TempSrc: Axillary                 Beryle Lathehomas E Brock

## 2017-10-24 NOTE — Interval H&P Note (Signed)
History and Physical Interval Note:  10/24/2017 12:08 PM  Robyn KocherJessica Bryant  has presented today for surgery, with the diagnosis of History of gastric sleeve, gastroesophageal reflux  The various methods of treatment have been discussed with the patient and family.  She had a friend, Mariane MastersBernese, with her.  After consideration of risks, benefits and other options for treatment, the patient has consented to  Procedure(s): ESOPHAGOGASTRODUODENOSCOPY (EGD) WITH PROPOFOL (N/A) as a surgical intervention .  The patient's history has been reviewed, patient examined, no change in status, stable for surgery.  I have reviewed the patient's chart and labs.  Questions were answered to the patient's satisfaction.     Kandis Cockingavid H Imara Standiford

## 2017-10-24 NOTE — Transfer of Care (Signed)
Immediate Anesthesia Transfer of Care Note  Patient: Robyn KocherJessica Bryant  Procedure(s) Performed: ESOPHAGOGASTRODUODENOSCOPY (EGD) WITH PROPOFOL (N/A )  Patient Location: PACU  Anesthesia Type:General  Level of Consciousness: awake, alert  and patient cooperative  Airway & Oxygen Therapy: Patient Spontanous Breathing and Patient connected to face mask oxygen  Post-op Assessment: Report given to RN and Post -op Vital signs reviewed and stable  Post vital signs: Reviewed and stable  Last Vitals:  Vitals:   10/24/17 1051  BP: 124/62  Pulse: 82  Resp: (!) 22  Temp: 36.7 C  SpO2: 99%    Last Pain:  Vitals:   10/24/17 1051  TempSrc: Oral         Complications: No apparent anesthesia complications

## 2017-10-24 NOTE — Anesthesia Procedure Notes (Signed)
Procedure Name: Intubation Date/Time: 10/24/2017 12:21 PM Performed by: West Pugh, CRNA Pre-anesthesia Checklist: Patient identified, Emergency Drugs available, Suction available, Patient being monitored and Timeout performed Patient Re-evaluated:Patient Re-evaluated prior to induction Oxygen Delivery Method: Circle system utilized Preoxygenation: Pre-oxygenation with 100% oxygen Induction Type: IV induction, Rapid sequence and Cricoid Pressure applied Laryngoscope Size: Mac and 4 Grade View: Grade I Tube type: Oral Tube size: 7.0 mm Number of attempts: 1 Airway Equipment and Method: Stylet Placement Confirmation: ETT inserted through vocal cords under direct vision,  positive ETCO2,  CO2 detector and breath sounds checked- equal and bilateral Secured at: 20 cm Tube secured with: Tape Dental Injury: Teeth and Oropharynx as per pre-operative assessment

## 2017-10-24 NOTE — Op Note (Signed)
10/24/2017  12:38 PM  PATIENT:  Robyn Bryant, 42 y.o., female, MRN: [id number]  PREOP DIAGNOSIS:  Sleeve gastrectomy, GERD  POSTOP DIAGNOSIS:   Sleeve gastrectomy, normal anatomy except for a somewhat prominent fundus with bile stained fluid in the prominence  PROCEDURE:  Esophagogastroduodenoscopy [photos taken and given to the patient]  SURGEON:   Ovidio Kinavid Bayan Hedstrom, M.D.  ANESTHESIA:   GET Anesthesiologist: Beryle LatheBrock, Thomas E, MD CRNA: Wynonia SoursWalker, Karen L, CRNA  INDICATIONS FOR PROCEDURE:  Robyn Bryant is a 42 y.o. (DOB: 10/29/1975)  hispanic female whose primary care physician is Etta GrandchildJones, Thomas L, MD and comes for upper endoscopy to evaluate significant GERD after sleeve gastrectomy.  The patient had a gastric sleeve on 08/08/2013 by Dr. Johna SheriffHoxworth.   The indications and risks of the endoscopy were explained to the patient.  The risks include, but are not limited to, perforation, bleeding, or injury to the bowel.  If balloon dilatation is needed, the risk of perforation is higher.  PROCEDURE:  [facility]  The patient was monitored with a pulse oximetry, BP cuff, and EKG.  The patient has nasal O2 flowing during the procedure.   She was intubated with anesthesia provided a CRNA.  A flexible Pentax endoscope was passed down the throat without difficulty.   Findings include:   Esophagus:   Normal   GE junction at:  38 cm   Stomach pouch: There was somewhat prominent fundus with bile stained fluid in the prominence, but I could not retroflex the endoscope in this area to see the EG junction.     Distal staple line of sleeve:   50 cm   Pylorus distance:  55 cm   Duodenum:  Normal to 2nd portion of the duodenum   CLO test:  Yes  PLAN:   Photos taken and given to patient.    Will order UGI and have follow up with Dr. Johna SheriffHoxworth.  Ovidio Kinavid Tyrah Broers, MD, [facility] Pager: [phone] Office phone:  [phone]

## 2017-10-25 ENCOUNTER — Encounter (HOSPITAL_COMMUNITY): Payer: Self-pay | Admitting: Surgery

## 2017-10-25 ENCOUNTER — Other Ambulatory Visit: Payer: Self-pay | Admitting: General Surgery

## 2017-10-25 LAB — CLOTEST (H. PYLORI), BIOPSY: HELICOBACTER SCREEN: NEGATIVE

## 2017-10-27 ENCOUNTER — Other Ambulatory Visit: Payer: Self-pay | Admitting: General Surgery

## 2017-10-27 DIAGNOSIS — Z9884 Bariatric surgery status: Secondary | ICD-10-CM

## 2017-11-01 ENCOUNTER — Ambulatory Visit
Admission: RE | Admit: 2017-11-01 | Discharge: 2017-11-01 | Disposition: A | Discharge status: Home / Self Care | Payer: BLUE CROSS/BLUE SHIELD | Source: Ambulatory Visit | Attending: General Surgery | Admitting: General Surgery

## 2017-11-01 DIAGNOSIS — Z9884 Bariatric surgery status: Secondary | ICD-10-CM

## 2017-11-02 ENCOUNTER — Encounter: Payer: Self-pay | Admitting: Internal Medicine

## 2017-11-03 ENCOUNTER — Ambulatory Visit: Payer: Self-pay

## 2017-11-03 NOTE — Telephone Encounter (Signed)
Patient called with c/o "diarrhea."  She says "it started 9 days ago. It doesn't matter what I eat or drink, it comes through me about 30 minutes to 1 hour later. I had a gastric sleeve and called my surgeon, who said to call my PCP." I asked her to describe the stool, she says "straight water, yellowish, mucus-like." I asked about pain, blood in stool, sighns of dehydration, she says "nothing except diarrhea. No fever or anything." According to protocol, see PCP within 24 hours, no availability with PCP, appointment for tomorrow, 11/04/17 at 1500 with Ria ClockLaura Murray, NP, care advice given, patient verbalized understanding.  Reason for Disposition . [1] SEVERE diarrhea (e.g., 7 or more times / day more than normal) AND [2] present > 24 hours (1 day)  Answer Assessment - Initial Assessment Questions 1. DIARRHEA SEVERITY: "How bad is the diarrhea?" "How many extra stools have you had in the past 24 hours than normal?"    - MILD: Few loose or mushy BMs; increase of 1-3 stools over normal daily number of stools; mild increase in ostomy output.   - MODERATE: Increase of 4-6 stools daily over normal; moderate increase in ostomy output.   - SEVERE (or Worst Possible): Increase of 7 or more stools daily over normal; moderate increase in ostomy output; incontinence.     Severe 2. ONSET: "When did the diarrhea begin?"      9 days ago 3. BM CONSISTENCY: "How loose or watery is the diarrhea?"      Watery, yellow, mucus 4. VOMITING: "Are you also vomiting?" If so, ask: "How many times in the past 24 hours?"      No 5. ABDOMINAL PAIN: "Are you having any abdominal pain?" If yes: "What does it feel like?" (e.g., crampy, dull, intermittent, constant)      No 6. ABDOMINAL PAIN SEVERITY: If present, ask: "How bad is the pain?"  (e.g., Scale 1-10; mild, moderate, or severe)    - MILD (1-3): doesn't interfere with normal activities, abdomen soft and not tender to touch     - MODERATE (4-7): interferes with normal  activities or awakens from sleep, tender to touch     - SEVERE (8-10): excruciating pain, doubled over, unable to do any normal activities       N/A 7. ORAL INTAKE: If vomiting, "Have you been able to drink liquids?" "How much fluids have you had in the past 24 hours?"     N/A 8. HYDRATION: "Any signs of dehydration?" (e.g., dry mouth [not just dry lips], too weak to stand, dizziness, new weight loss) "When did you last urinate?"     No 9. EXPOSURE: "Have you traveled to a foreign country recently?" "Have you been exposed to anyone with diarrhea?" "Could you have eaten any food that was spoiled?"     No 10. OTHER SYMPTOMS: "Do you have any other symptoms?" (e.g., fever, blood in stool)       No 11. PREGNANCY: "Is there any chance you are pregnant?" "When was your last menstrual period?"       No  Protocols used: DIARRHEA-A-AH

## 2017-11-04 ENCOUNTER — Other Ambulatory Visit (INDEPENDENT_AMBULATORY_CARE_PROVIDER_SITE_OTHER): Payer: BLUE CROSS/BLUE SHIELD

## 2017-11-04 ENCOUNTER — Encounter: Payer: Self-pay | Admitting: Family

## 2017-11-04 ENCOUNTER — Ambulatory Visit (INDEPENDENT_AMBULATORY_CARE_PROVIDER_SITE_OTHER): Payer: BLUE CROSS/BLUE SHIELD | Admitting: Family

## 2017-11-04 VITALS — BP 108/76 | HR 72 | Temp 98.0°F | Ht 64.0 in | Wt 201.1 lb

## 2017-11-04 DIAGNOSIS — R197 Diarrhea, unspecified: Secondary | ICD-10-CM

## 2017-11-04 LAB — COMPREHENSIVE METABOLIC PANEL
ALT: 12 U/L (ref 0–35)
AST: 11 U/L (ref 0–37)
Albumin: 3.8 g/dL (ref 3.5–5.2)
Alkaline Phosphatase: 51 U/L (ref 39–117)
BUN: 10 mg/dL (ref 6–23)
CHLORIDE: 105 meq/L (ref 96–112)
CO2: 24 mEq/L (ref 19–32)
CREATININE: 0.77 mg/dL (ref 0.40–1.20)
Calcium: 9 mg/dL (ref 8.4–10.5)
GFR: 106.13 mL/min (ref >60.00)
Glucose, Bld: 106 mg/dL — ABNORMAL HIGH (ref 70–99)
Potassium: 3.5 mEq/L (ref 3.5–5.1)
Sodium: 138 mEq/L (ref 135–145)
Total Bilirubin: 0.5 mg/dL (ref 0.2–1.2)
Total Protein: 7.1 g/dL (ref 6.0–8.3)

## 2017-11-04 LAB — CBC WITH DIFFERENTIAL/PLATELET
BASOS ABS: 0.1 10*3/uL (ref 0.0–0.1)
BASOS PCT: 0.9 % (ref 0.0–3.0)
EOS ABS: 0.3 10*3/uL (ref 0.0–0.7)
Eosinophils Relative: 3.4 % (ref 0.0–5.0)
HEMATOCRIT: 39.8 % (ref 36.0–46.0)
Hemoglobin: 13.2 g/dL (ref 12.0–15.0)
LYMPHS PCT: 40 % (ref 12.0–46.0)
Lymphs Abs: 3.2 10*3/uL (ref 0.7–4.0)
MCHC: 33.1 g/dL (ref 30.0–36.0)
MCV: 81.5 fl (ref 78.0–100.0)
Monocytes Absolute: 0.7 10*3/uL (ref 0.1–1.0)
Monocytes Relative: 9 % (ref 3.0–12.0)
Neutro Abs: 3.7 10*3/uL (ref 1.4–7.7)
Neutrophils Relative %: 46.7 % (ref 43.0–77.0)
Platelets: 318 10*3/uL (ref 150.0–400.0)
RBC: 4.88 Mil/uL (ref 3.87–5.11)
RDW: 14.7 % (ref 11.5–15.5)
WBC: 8 10*3/uL (ref 4.0–10.5)

## 2017-11-04 MED ORDER — CIPROFLOXACIN HCL 500 MG PO TABS
500.0000 mg | ORAL_TABLET | Freq: Two times a day (BID) | ORAL | 0 refills | Status: DC
Start: 2017-11-04 — End: 2017-11-22

## 2017-11-04 NOTE — Progress Notes (Signed)
Robyn Bryant is a 42 y.o. female with the following history as recorded in Epic[employer]:  Patient Active Problem List   Diagnosis Date Noted  . Patellofemoral syndrome of both knees 11/09/2016  . Low back pain 08/24/2016  . Visit for screening mammogram 08/23/2016  . Vitamin D deficiency 02/03/2015  . Insomnia 01/18/2014  . Thiamine deficiency 12/24/2013  . Gastric bypass status for obesity 12/20/2013  . GERD (gastroesophageal reflux disease) 09/20/2013  . B12 deficiency 09/20/2013  . Obesity, Class III, BMI 40-49.9 (morbid obesity) s/p lap gleeve gastrectomy 05/10/2013  . Chronic pain of both knees 07/21/2011  . Routine general medical examination at a health care facility 07/14/2011    Current Outpatient Medications  Medication Sig Dispense Refill  . pantoprazole (PROTONIX) 40 MG tablet Take 40 mg by mouth 2 (two) times daily.     . Topiramate ER (TROKENDI XR) 100 MG CP24 Take 150 mg by mouth every evening.    . Vitamin D, Ergocalciferol, (DRISDOL) 50000 units CAPS capsule Take 1 capsule (50,000 Units total) by mouth every 7 (seven) days. (Patient taking differently: Take 50,000 Units by mouth every Sunday. ) 12 capsule 0  . ciprofloxacin (CIPRO) 500 MG tablet Take 1 tablet (500 mg total) by mouth 2 (two) times daily. 20 tablet 0   No current facility-administered medications for this visit.     Allergies: Morphine and related; Amoxicillin; Latex; and Dilaudid [hydromorphone]  Past Medical History:  Diagnosis Date  . Asthma    as teenager, none now  pt.denies  . Headache    migraines    Past Surgical History:  Procedure Laterality Date  . BREAST BIOPSY Left   . BREAST CYST ASPIRATION    . CHOLECYSTECTOMY N/A 09/07/2013   Procedure: LAPAROSCOPIC CHOLECYSTECTOMY WITH INTRAOPERATIVE CHOLANGIOGRAM;  Surgeon: Edward Jolly, MD;  Location: [facility];  Service: General;  Laterality: N/A;  . ESOPHAGOGASTRODUODENOSCOPY N/A 09/06/2013   Procedure: ESOPHAGOGASTRODUODENOSCOPY (EGD);   Surgeon: Shann Medal, MD;  Location: [facility];  Service: General;  Laterality: N/A;  . ESOPHAGOGASTRODUODENOSCOPY (EGD) WITH PROPOFOL N/A 10/24/2017   Procedure: ESOPHAGOGASTRODUODENOSCOPY (EGD) WITH PROPOFOL;  Surgeon: Alphonsa Overall, MD;  Location: [facility];  Service: General;  Laterality: N/A;  . LAPAROSCOPIC GASTRIC SLEEVE RESECTION N/A 08/08/2013   Procedure: LAPAROSCOPIC GASTRIC SLEEVE RESECTION UPPER ENDOSCOPY;  Surgeon: Edward Jolly, MD;  Location: [facility];  Service: General;  Laterality: N/A;  . TUBAL LIGATION  2001  . UPPER GI ENDOSCOPY  08/08/2013   Procedure: UPPER GI ENDOSCOPY;  Surgeon: Edward Jolly, MD;  Location: [facility];  Service: General;;    Family History  Problem Relation Age of Onset  . Diabetes Mother   . Hypertension Mother   . Alcohol abuse Father   . Asthma Sister     Social History   Tobacco Use  . Smoking status: Never Smoker  . Smokeless tobacco: Never Used  Substance Use Topics  . Alcohol use: No    Subjective:  9 day history of diarrhea; denies any recent travel; no household contacts with similar symptoms; describes as "mucusy and yellow." History of sleeve- talked to bariatric surgeon who did not feel symptoms related; no blood in stool; feels like stool is "leaking out of me." Not affected by food; denies any abdominal pain; "feels funny movement in colon." Diarrhea can wake me up out of my sleep; has not taken any antibiotics recently; denies any new medications;  LMP- last week/ normal;   Objective:  Vitals:   11/04/17  1456  BP: 108/76  Pulse: 72  Temp: 98 F (36.7 C)  TempSrc: Oral  SpO2: 99%  Weight: 201 lb 1.3 oz (91.2 kg)  Height: '5\' 4"'  (1.626 m)    General: Well developed, well nourished, in no acute distress  Skin : Warm and dry.  Head: Normocephalic and atraumatic  Eyes: Sclera and conjunctiva clear; pupils round and reactive to light; extraocular movements intact  Ears: External normal; canals clear; tympanic  membranes normal  Oropharynx: Pink, supple. No suspicious lesions  Neck: Supple without thyromegaly, adenopathy  Lungs: Respirations unlabored; clear to auscultation bilaterally without wheeze, rales, rhonchi  CVS exam: normal rate and regular rhythm.  Neurologic: Alert and oriented; speech intact; face symmetrical; moves all extremities well; CNII-XII intact without focal deficit   Assessment:  1. Diarrhea, unspecified type     Plan:  ? Etiology- infectious? Check CBC, CMP today; check stool cultures and C. Diff; will go ahead and start Cipro 500 mg bid x 10 days; follow-up to be determined;   No follow-ups on file.  Orders Placed This Encounter  Procedures  . Stool C-Diff Toxin Assay    Standing Status:   Future    Standing Expiration Date:   11/04/2018  . Stool Culture    Standing Status:   Future    Standing Expiration Date:   11/04/2018  . CBC w/Diff    Standing Status:   Future    Number of Occurrences:   1    Standing Expiration Date:   11/04/2018  . Comp Met (CMET)    Standing Status:   Future    Number of Occurrences:   1    Standing Expiration Date:   11/04/2018    Requested Prescriptions   Signed Prescriptions Disp Refills  . ciprofloxacin (CIPRO) 500 MG tablet 20 tablet 0    Sig: Take 1 tablet (500 mg total) by mouth 2 (two) times daily.

## 2017-11-04 NOTE — Patient Instructions (Signed)
Diarrhea, Adult °Diarrhea is when you have loose and water poop (stool) often. Diarrhea can make you feel weak and cause you to get dehydrated. Dehydration can make you tired and thirsty, make you have a dry mouth, and make it so you pee (urinate) less often. Diarrhea often lasts 2-3 days. However, it can last longer if it is a sign of something more serious. It is important to treat your diarrhea as told by your doctor. °Follow these instructions at home: °Eating and drinking ° °Follow these recommendations as told by your doctor: °· Take an oral rehydration solution (ORS). This is a drink that is sold at pharmacies and stores. °· Drink clear fluids, such as: °? Water. °? Ice chips. °? Diluted fruit juice. °? Low-calorie sports drinks. °· Eat bland, easy-to-digest foods in small amounts as you are able. These foods include: °? Bananas. °? Applesauce. °? Rice. °? Low-fat (lean) meats. °? Toast. °? Crackers. °· Avoid drinking fluids that have a lot of sugar or caffeine in them. °· Avoid alcohol. °· Avoid spicy or fatty foods. ° °General instructions ° °· Drink enough fluid to keep your pee (urine) clear or pale yellow. °· Wash your hands often. If you cannot use soap and water, use hand sanitizer. °· Make sure that all people in your home wash their hands well and often. °· Take over-the-counter and prescription medicines only as told by your doctor. °· Rest at home while you get better. °· Watch your condition for any changes. °· Take a warm bath to help with any burning or pain from having diarrhea. °· Keep all follow-up visits as told by your doctor. This is important. °Contact a doctor if: °· You have a fever. °· Your diarrhea gets worse. °· You have new symptoms. °· You cannot keep fluids down. °· You feel light-headed or dizzy. °· You have a headache. °· You have muscle cramps. °Get help right away if: °· You have chest pain. °· You feel very weak or you pass out (faint). °· You have bloody or black poop or  poop that look like tar. °· You have very bad pain, cramping, or bloating in your belly (abdomen). °· You have trouble breathing or you are breathing very quickly. °· Your heart is beating very quickly. °· Your skin feels cold and clammy. °· You feel confused. °· You have signs of dehydration, such as: °? Dark pee, hardly any pee, or no pee. °? Cracked lips. °? Dry mouth. °? Sunken eyes. °? Sleepiness. °? Weakness. °This information is not intended to replace advice given to you by your health care provider. Make sure you discuss any questions you have with your health care provider. °Document Released: 01/12/2008 Document Revised: 02/13/2016 Document Reviewed: 04/01/2015 °Elsevier Interactive Patient Education © 2018 [employer] ° °

## 2017-11-07 ENCOUNTER — Encounter: Payer: Self-pay | Admitting: Family

## 2017-11-07 NOTE — Telephone Encounter (Signed)
Please advise. Thanks.  

## 2017-11-08 NOTE — Telephone Encounter (Signed)
So glad to hear she is feeling better; let's have her take the Cipro for a total of 7 days; okay to hold the stool cultures unless the diarrhea re-starts.

## 2018-01-09 ENCOUNTER — Other Ambulatory Visit: Payer: Self-pay | Admitting: Family Medicine

## 2018-01-09 NOTE — Telephone Encounter (Signed)
Refill denied. Pt has completed course of treatment.  

## 2018-01-13 ENCOUNTER — Other Ambulatory Visit: Payer: Self-pay | Admitting: Family Medicine

## 2018-01-13 NOTE — Telephone Encounter (Signed)
Refill denied. Pt needs an appointment.  

## 2018-01-18 ENCOUNTER — Other Ambulatory Visit: Payer: Self-pay | Admitting: Family Medicine

## 2018-01-18 NOTE — Telephone Encounter (Signed)
Pt has completed course of treatment. Refill denied.  

## 2018-02-12 ENCOUNTER — Other Ambulatory Visit: Payer: Self-pay | Admitting: Family Medicine

## 2018-06-22 ENCOUNTER — Ambulatory Visit: Payer: BLUE CROSS/BLUE SHIELD

## 2018-06-22 ENCOUNTER — Ambulatory Visit (INDEPENDENT_AMBULATORY_CARE_PROVIDER_SITE_OTHER): Payer: BC Managed Care – PPO

## 2018-06-22 DIAGNOSIS — Z23 Encounter for immunization: Secondary | ICD-10-CM

## 2018-07-24 ENCOUNTER — Encounter: Payer: Self-pay | Admitting: Internal Medicine

## 2018-07-24 ENCOUNTER — Ambulatory Visit: Payer: BC Managed Care – PPO | Admitting: Internal Medicine

## 2018-07-24 VITALS — BP 120/80 | HR 71 | Temp 98.1°F | Ht 64.0 in | Wt 199.8 lb

## 2018-07-24 DIAGNOSIS — J029 Acute pharyngitis, unspecified: Secondary | ICD-10-CM | POA: Diagnosis not present

## 2018-07-24 DIAGNOSIS — J020 Streptococcal pharyngitis: Secondary | ICD-10-CM | POA: Diagnosis not present

## 2018-07-24 LAB — POCT RAPID STREP A (OFFICE): Rapid Strep A Screen: POSITIVE — AB

## 2018-07-24 MED ORDER — AZITHROMYCIN 500 MG PO TABS: 500.0000 mg | ORAL_TABLET | Freq: Every day | ORAL | 0 refills | Status: AC

## 2018-07-24 NOTE — Patient Instructions (Signed)
Strep Throat Strep throat is a bacterial infection of the throat. Your health care provider may call the infection tonsillitis or pharyngitis, depending on whether there is swelling in the tonsils or at the back of the throat. Strep throat is most common during the cold months of the year in children who are 5-41 years of age, but it can happen during any season in people of any age. This infection is spread from person to person (contagious) through coughing, sneezing, or close contact. What are the causes? Strep throat is caused by the bacteria called Streptococcus pyogenes. What increases the risk? This condition is more likely to develop in:  People who spend time in crowded places where the infection can spread easily.  People who have close contact with someone who has strep throat.  What are the signs or symptoms? Symptoms of this condition include:  Fever or chills.  Redness, swelling, or pain in the tonsils or throat.  Pain or difficulty when swallowing.  White or yellow spots on the tonsils or throat.  Swollen, tender glands in the neck or under the jaw.  Red rash all over the body (rare).  How is this diagnosed? This condition is diagnosed by performing a rapid strep test or by taking a swab of your throat (throat culture test). Results from a rapid strep test are usually ready in a few minutes, but throat culture test results are available after one or two days. How is this treated? This condition is treated with antibiotic medicine. Follow these instructions at home: Medicines  Take over-the-counter and prescription medicines only as told by your health care provider.  Take your antibiotic as told by your health care provider. Do not stop taking the antibiotic even if you start to feel better.  Have family members who also have a sore throat or fever tested for strep throat. They may need antibiotics if they have the strep infection. Eating and drinking  Do not  share food, drinking cups, or personal items that could cause the infection to spread to other people.  If swallowing is difficult, try eating soft foods until your sore throat feels better.  Drink enough fluid to keep your urine clear or pale yellow. General instructions  Gargle with a salt-water mixture 3-4 times per day or as needed. To make a salt-water mixture, completely dissolve -1 tsp of salt in 1 cup of warm water.  Make sure that all household members wash their hands well.  Get plenty of rest.  Stay home from school or work until you have been taking antibiotics for 24 hours.  Keep all follow-up visits as told by your health care provider. This is important. Contact a health care provider if:  The glands in your neck continue to get bigger.  You develop a rash, cough, or earache.  You cough up a thick liquid that is green, yellow-brown, or bloody.  You have pain or discomfort that does not get better with medicine.  Your problems seem to be getting worse rather than better.  You have a fever. Get help right away if:  You have new symptoms, such as vomiting, severe headache, stiff or painful neck, chest pain, or shortness of breath.  You have severe throat pain, drooling, or changes in your voice.  You have swelling of the neck, or the skin on the neck becomes red and tender.  You have signs of dehydration, such as fatigue, dry mouth, and decreased urination.  You become increasingly sleepy, or   you cannot wake up completely.  Your joints become red or painful. This information is not intended to replace advice given to you by your health care provider. Make sure you discuss any questions you have with your health care provider. Document Released: 07/23/2000 Document Revised: 03/24/2016 Document Reviewed: 11/18/2014 Elsevier Interactive Patient Education  2018 [employer] Inc[employer]  

## 2018-07-24 NOTE — Progress Notes (Signed)
Subjective:  Patient ID: Robyn Bryant, female    DOB: 1976-05-15  Age: 42 y.o. MRN: [id number]  CC: Sore Throat   HPI Robyn Bryant presents for a 4-day history of sore throat, chills, low-grade fever, and lymphadenopathy.  Outpatient Medications Prior to Visit  Medication Sig Dispense Refill  . pantoprazole (PROTONIX) 40 MG tablet Take 40 mg by mouth 2 (two) times daily.     . ciprofloxacin (CIPRO) 500 MG tablet Take 1 tablet (500 mg total) by mouth 2 (two) times daily. 20 tablet 0  . Topiramate ER (TROKENDI XR) 100 MG CP24 Take 150 mg by mouth every evening.    . Vitamin D, Ergocalciferol, (DRISDOL) 50000 units CAPS capsule TAKE 1 CAPSULE (50,000 UNITS TOTAL) BY MOUTH EVERY 7 (SEVEN) DAYS. 12 capsule 0   No facility-administered medications prior to visit.     ROS Review of Systems  Constitutional: Positive for chills and fever. Negative for diaphoresis and fatigue.  HENT: Positive for sore throat. Negative for trouble swallowing.   Eyes: Negative.   Respiratory: Negative for cough, chest tightness, shortness of breath and wheezing.   Cardiovascular: Negative for chest pain, palpitations and leg swelling.  Gastrointestinal: Negative for abdominal pain, constipation, diarrhea, nausea and vomiting.  Endocrine: Negative.   Genitourinary: Negative.  Negative for difficulty urinating.  Musculoskeletal: Negative.  Negative for arthralgias and myalgias.  Skin: Negative.  Negative for color change and rash.  Neurological: Negative.  Negative for dizziness, weakness and headaches.  Hematological: Negative for adenopathy. Does not bruise/bleed easily.  Psychiatric/Behavioral: Negative.     Objective:  BP 120/80 (BP Location: Left Arm, Patient Position: Sitting, Cuff Size: Large)   Pulse 71   Temp 98.1 F (36.7 C) (Oral)   Ht 5\' 4"  (1.626 m)   Wt 199 lb 12 oz (90.6 kg)   SpO2 99%   BMI 34.29 kg/m   BP Readings from Last 3 Encounters:  07/24/18 120/80  11/04/17 108/76    10/24/17 97/64    Wt Readings from Last 3 Encounters:  07/24/18 199 lb 12 oz (90.6 kg)  11/04/17 201 lb 1.3 oz (91.2 kg)  10/24/17 200 lb (90.7 kg)    Physical Exam Vitals signs reviewed.  Constitutional:      General: She is not in acute distress.    Appearance: She is not ill-appearing, toxic-appearing or diaphoretic.  HENT:     Right Ear: Tympanic membrane and ear canal normal. No middle ear effusion.     Left Ear: Tympanic membrane and ear canal normal.  No middle ear effusion.     Mouth/Throat:     Mouth: Mucous membranes are moist.     Pharynx: Posterior oropharyngeal erythema present. No pharyngeal swelling, oropharyngeal exudate or uvula swelling.     Tonsils: No tonsillar exudate or tonsillar abscesses. Swelling: 0 on the right. 0 on the left.  Neck:     Musculoskeletal: Normal range of motion and neck supple. No neck rigidity or muscular tenderness.  Cardiovascular:     Rate and Rhythm: Normal rate and regular rhythm.     Pulses: Normal pulses.     Heart sounds: Normal heart sounds. No murmur. No friction rub. No gallop.   Pulmonary:     Effort: Pulmonary effort is normal.     Breath sounds: Normal breath sounds. No wheezing, rhonchi or rales.  Abdominal:     General: Abdomen is flat. Bowel sounds are normal.     Palpations: Abdomen is soft. There is no hepatomegaly, splenomegaly  or mass.     Tenderness: There is no abdominal tenderness.  Musculoskeletal: Normal range of motion.        General: No swelling or tenderness.  Lymphadenopathy:     Head:     Right side of head: No occipital adenopathy.     Left side of head: No occipital adenopathy.     Cervical: Cervical adenopathy present.     Right cervical: Superficial cervical adenopathy present. No deep or posterior cervical adenopathy.    Left cervical: Superficial cervical adenopathy present. No deep or posterior cervical adenopathy.     Upper Body:     Right upper body: No supraclavicular, axillary, pectoral  or epitrochlear adenopathy.     Left upper body: No supraclavicular, axillary, pectoral or epitrochlear adenopathy.  Skin:    General: Skin is warm and dry.     Findings: No rash.  Neurological:     General: No focal deficit present.     Lab Results  Component Value Date   WBC 8.0 11/04/2017   HGB 13.2 11/04/2017   HCT 39.8 11/04/2017   PLT 318.0 11/04/2017   GLUCOSE 106 (H) 11/04/2017   CHOL 174 08/23/2016   TRIG 85.0 08/23/2016   HDL 58.70 08/23/2016   LDLCALC 98 08/23/2016   ALT 12 11/04/2017   AST 11 11/04/2017   NA 138 11/04/2017   K 3.5 11/04/2017   CL 105 11/04/2017   CREATININE 0.77 11/04/2017   BUN 10 11/04/2017   CO2 24 11/04/2017   TSH 1.65 08/23/2016   HGBA1C 5.2 02/03/2015    Dg Ugi W/high Density W/kub  Result Date: 11/01/2017 CLINICAL DATA:  Post laparoscopic sleeve gastrectomy, abdominal discomfort EXAM: UPPER GI SERIES WITH KUB TECHNIQUE: After obtaining a scout radiograph a routine upper GI series was performed using thin and high density barium. FLUOROSCOPY TIME:  Fluoroscopy Time:  1 minutes 42 seconds Radiation Exposure Index (if provided by the fluoroscopic device): 127 mGy Number of Acquired Spot Images: 0 COMPARISON:  CT abdomen pelvis of 09/17/2013 FINDINGS: A preliminary film of the abdomen shows a nonspecific bowel gas pattern. Surgical clips are present in the right upper quadrant from prior cholecystectomy. No opaque calculi are seen. A double-contrast upper GI was performed. The mucosa of the esophagus is well seen. No ulceration or mass is evident. It is noted that the gastroesophageal junction is widely patent. Changes of gastric sleeve are noted. No gastric abnormality is noted. The duodenal bulb fills and the duodenal loop is in normal position. At the end of the study, moderate gastroesophageal reflux was demonstrated some of which was spontaneous. A barium pill was given at the end of the study which passed into the stomach without delay.  IMPRESSION: 1. Very patulous gastroesophageal junction allowing moderate spontaneous gastroesophageal reflux to occur. 2. Barium pill passes into the stomach without delay. 3. Changes of gastric sleeve with no complications noted. Electronically Signed   By: Dwyane Dee M.D.   On: 11/01/2017 09:45    Assessment & Plan:   Robyn Bryant was seen today for sore throat.  Diagnoses and all orders for this visit:  Sore throat -     Cancel: POC Influenza A&B (Binax test) -     POCT rapid strep A  Strep throat- Her strep test is positive.  She lists amoxicillin as an allergy so will treat with a 3-day course of azithromycin. -     azithromycin (ZITHROMAX) 500 MG tablet; Take 1 tablet (500 mg total) by mouth daily  for 3 days.   I have discontinued Robyn Bryant's Topiramate ER, ciprofloxacin, and Vitamin D (Ergocalciferol). I am also having her start on azithromycin. Additionally, I am having her maintain her pantoprazole.  Meds ordered this encounter  Medications  . azithromycin (ZITHROMAX) 500 MG tablet    Sig: Take 1 tablet (500 mg total) by mouth daily for 3 days.    Dispense:  3 tablet    Refill:  0     Follow-up: Return if symptoms worsen or fail to improve.  Sanda Lingerhomas [name], MD

## 2018-07-25 ENCOUNTER — Encounter: Payer: Self-pay | Admitting: Internal Medicine

## 2018-08-24 NOTE — Progress Notes (Signed)
Please place orders in Epic as patient has a pre-op appointment on 09/07/2018! Thank you!

## 2018-08-28 ENCOUNTER — Ambulatory Visit: Payer: Self-pay | Admitting: General Surgery

## 2018-08-29 ENCOUNTER — Telehealth (HOSPITAL_COMMUNITY): Payer: Self-pay

## 2018-08-29 NOTE — Telephone Encounter (Signed)
Patient called to discuss appointment time for pre op bariatric class.  Patient states understanding and will come in for one on one class with [facility] and [facility] next week.  NDES to schedule

## 2018-09-06 ENCOUNTER — Encounter: Payer: BC Managed Care – PPO | Attending: General Surgery | Admitting: Skilled Nursing Facility1

## 2018-09-06 ENCOUNTER — Encounter (HOSPITAL_COMMUNITY): Payer: Self-pay

## 2018-09-06 DIAGNOSIS — K219 Gastro-esophageal reflux disease without esophagitis: Secondary | ICD-10-CM | POA: Insufficient documentation

## 2018-09-06 NOTE — Progress Notes (Signed)
Pre-Operative Nutrition Class:  Appt start time: 7340   End time:  1830.  Patient was seen on 09/06/2018 for Pre-Operative Bariatric Surgery Education at [facility].   Pt states she does not want to lose weight and is only doing the conversion due to GERD. Pt states she does not have GERD throughout the day and only has it at night. Pt states she eats about every hour and has Dumping sometimes stating she has not noticed with what types of foods.   Surgery date: February 4th, 2020 Surgery type: conversion from sleeve to RYGB  Weight today: 205.9  llowing the learning objectives were met by the patient during this course:  Identify Pre-Op Dietary Goals and will begin 2 weeks pre-operatively  Identify appropriate sources of fluids and proteins   State protein recommendations and appropriate sources pre and post-operatively  Identify Post-Operative Dietary Goals and will follow for 2 weeks post-operatively  Identify appropriate multivitamin and calcium sources  Describe the need for physical activity post-operatively and will follow MD recommendations  State when to call healthcare provider regarding medication questions or post-operative complications  Handouts given during class include:  Pre-Op Bariatric Surgery Diet Handout  Protein Shake Handout  Post-Op Bariatric Surgery Nutrition Handout  [facility]EL[facility] Program Information Flyer  Support Group Information Flyer  [facility] Bariatric Supplements Price List  Follow-Up Plan: Patient will follow-up at [facility] 2 weeks post operatively for diet advancement per MD.

## 2018-09-06 NOTE — Patient Instructions (Addendum)
Your procedure is scheduled on: Tuesday, Feb. 4, 2020   Surgery Time:  7:30AM-10:44AM   [facility]    Report to admitting at 5:30 AM   Call this number if you have problems the morning of surgery [phone]   NO SOLID FOOD AFTER 600 PM THE NIGHT BEFORE YOUR SURGERY.     YOU MAY DRINK CLEAR FLUIDS.   CLEAR LIQUID DIET Foods Allowed                                                                     Foods Excluded  Water, Black Coffee and tea, regular and decaf                             liquids that you cannot  Plain Jell-O in any flavor                                             see through such as: Fruit ices (not with fruit pulp)                                     milk, soups, orange juice  Iced Popsicles                                    All solid food Carbonated beverages, regular and diet                                    Cranberry, grape and apple juices Sports drinks like Gatorade Lightly seasoned clear broth or consume(fat free) Sugar, honey syrup  Sample Menu Breakfast                                Lunch                                     Supper Cranberry juice                    Beef broth                            Chicken broth Jell-O                                     Grape juice                           Apple juice Coffee or tea  Jell-O                                      Popsicle                                                Coffee or tea                        Coffee or tea   THE SHAKE YOU DRINK BEFORE YOU LEAVE HOME WILL BE THE LAST FLUIDS YOU DRINK BEFORE SURGERY.   Brush your teeth the morning of surgery.   Do NOT smoke after Midnight   Take these medicines the morning of surgery with A SIP OF WATER: Lamotrigine, Pantoprazole                               You may not have any metal on your body including hair pins, jewelry, and body piercings             Do not wear make-up,  lotions, powders, perfumes/cologne, or deodorant             Do not wear nail polish.  Do not shave  48 hours prior to surgery.                Do not bring valuables to the hospital. [facility] IS NOT             RESPONSIBLE   FOR VALUABLES.   Contacts, dentures or bridgework may not be worn into surgery.   Leave suitcase in the car. After surgery it may be brought to your room.    Special Instructions: Bring a copy of your healthcare power of attorney and living will documents         the day of surgery if you haven't scanned them in before.   PAIN IS EXPECTED AFTER SURGERY AND WILL NOT BE COMPLETELY ELIMINATED. AMBULATION AND TYLENOL WILL HELP REDUCE INCISIONAL AND GAS PAIN. MOVEMENT IS KEY!   YOU ARE EXPECTED TO BE OUT OF BED WITHIN 4 HOURS OF ADMISSION TO YOUR PATIENT ROOM.   SITTING IN THE RECLINER THROUGHOUT THE DAY IS IMPORTANT FOR DRINKING FLUIDS AND MOVING GAS THROUGHOUT THE GI TRACT.   COMPRESSION STOCKINGS SHOULD BE [facility]N [facility] UNLESS YOU ARE WALKING.    INCENTIVE SPIROMETER SHOULD BE USED EVERY HOUR WHILE AWAKE TO DECREASE POST-OPERATIVE COMPLICATIONS SUCH AS PNEUMONIA.   WHEN DISCHARGED HOME, IT IS IMPORTANT TO CONTINUE TO WALK EVERY HOUR AND USE THE INCENTIVE SPIROMETER EVERY HOUR.               Please read over the following fact sheets you were given:  [facility] Before surgery, you can play an important role.  Because skin is not sterile, your skin needs to be as free of germs as possible.  You can reduce the number of germs on your skin by washing with CHG (chlorahexidine gluconate) soap before surgery.  CHG is an antiseptic cleaner which kills germs and bonds with the skin to continue killing germs even after washing. Please DO NOT use if you have an allergy to CHG or antibacterial  soaps.  If your skin becomes reddened/irritated stop using the CHG and inform your nurse when you arrive at Short Stay. Do not shave  (including legs and underarms) for at least 48 hours prior to the first CHG shower.  You may shave your face/neck.  Please follow these instructions carefully:  1.  Shower with CHG Soap the night before surgery and the  morning of surgery.  2.  If you choose to wash your hair, wash your hair first as usual with your normal  shampoo.  3.  After you shampoo, rinse your hair and body thoroughly to remove the shampoo.                             4.  Use CHG as you would any other liquid soap.  You can apply chg directly to the skin and wash.  Gently with a scrungie or clean washcloth.  5.  Apply the CHG Soap to your body ONLY FROM THE NECK DOWN.   Do   not use on face/ open                           Wound or open sores. Avoid contact with eyes, ears mouth and   genitals (private parts).                       Wash face,  Genitals (private parts) with your normal soap.             6.  Wash thoroughly, paying special attention to the area where your    surgery  will be performed.  7.  Thoroughly rinse your body with warm water from the neck down.  8.  DO NOT shower/wash with your normal soap after using and rinsing off the CHG Soap.                9.  Pat yourself dry with a clean towel.            10.  Wear clean pajamas.            11.  Place clean sheets on your bed the night of your first shower and do not  sleep with pets. Day of Surgery : Do not apply any lotions/deodorants the morning of surgery.  Please wear clean clothes to the hospital/surgery center.  FAILURE TO FOLLOW THESE INSTRUCTIONS MAY RESULT IN THE CANCELLATION OF YOUR SURGERY  PATIENT SIGNATURE_________________________________  NURSE SIGNATURE__________________________________  ________________________________________________________________________

## 2018-09-07 ENCOUNTER — Encounter (HOSPITAL_COMMUNITY): Payer: Self-pay

## 2018-09-07 ENCOUNTER — Encounter (HOSPITAL_COMMUNITY)
Admission: RE | Admit: 2018-09-07 | Discharge: 2018-09-07 | Disposition: A | Discharge status: Home / Self Care | Payer: BC Managed Care – PPO | Source: Ambulatory Visit | Attending: General Surgery | Admitting: General Surgery

## 2018-09-07 ENCOUNTER — Ambulatory Visit (HOSPITAL_COMMUNITY)
Admission: RE | Admit: 2018-09-07 | Discharge: 2018-09-07 | Disposition: A | Discharge status: Home / Self Care | Payer: BC Managed Care – PPO | Source: Ambulatory Visit | Attending: General Surgery | Admitting: General Surgery

## 2018-09-07 ENCOUNTER — Other Ambulatory Visit: Payer: Self-pay

## 2018-09-07 DIAGNOSIS — Z01818 Encounter for other preprocedural examination: Secondary | ICD-10-CM | POA: Insufficient documentation

## 2018-09-07 HISTORY — DX: Low back pain: M54.5

## 2018-09-07 HISTORY — DX: Gastro-esophageal reflux disease without esophagitis: K21.9

## 2018-09-07 HISTORY — DX: Major depressive disorder, single episode, unspecified: F32.9

## 2018-09-07 HISTORY — DX: Vitamin D deficiency, unspecified: E55.9

## 2018-09-07 HISTORY — DX: Low back pain, unspecified: M54.50

## 2018-09-07 HISTORY — DX: Personal history of other diseases of the nervous system and sense organs: Z86.69

## 2018-09-07 HISTORY — DX: Panniculitis, unspecified: M79.3

## 2018-09-07 HISTORY — DX: Deficiency of other specified B group vitamins: E53.8

## 2018-09-07 HISTORY — DX: Migraine, unspecified, not intractable, without status migrainosus: G43.909

## 2018-09-07 HISTORY — DX: Other specified postprocedural states: Z98.890

## 2018-09-07 HISTORY — DX: Depression, unspecified: F32.A

## 2018-09-07 HISTORY — DX: Other specified postprocedural states: R11.2

## 2018-09-07 HISTORY — DX: Personal history of other specified conditions: Z87.898

## 2018-09-07 HISTORY — DX: Insomnia, unspecified: G47.00

## 2018-09-07 HISTORY — DX: Anxiety disorder, unspecified: F41.9

## 2018-09-07 HISTORY — DX: Unspecified fracture of left toe(s), initial encounter for closed fracture: S92.912A

## 2018-09-07 HISTORY — DX: Unspecified osteoarthritis, unspecified site: M19.90

## 2018-09-07 HISTORY — DX: Thiamine deficiency, unspecified: E51.9

## 2018-09-07 LAB — CBC
HEMATOCRIT: 39.1 % (ref 36.0–46.0)
Hemoglobin: 12.3 g/dL (ref 12.0–15.0)
MCH: 27.3 pg (ref 26.0–34.0)
MCHC: 31.5 g/dL (ref 30.0–36.0)
MCV: 86.9 fL (ref 80.0–100.0)
Platelets: 312 10*3/uL (ref 150–400)
RBC: 4.5 MIL/uL (ref 3.87–5.11)
RDW: 13.7 % (ref 11.5–15.5)
WBC: 7.6 10*3/uL (ref 4.0–10.5)
nRBC: 0 % (ref 0.0–0.2)

## 2018-09-11 MED ORDER — BUPIVACAINE LIPOSOME 1.3 % IJ SUSP
20.0000 mL | Freq: Once | INTRAMUSCULAR | Status: DC
  Filled 2018-09-11: qty 20

## 2018-09-11 NOTE — Anesthesia Preprocedure Evaluation (Addendum)
Anesthesia Evaluation  Patient identified by MRN, date of birth, ID band Patient awake    Reviewed: Allergy & Precautions, NPO status , Patient's Chart, lab work & pertinent test results  History of Anesthesia Complications (+) PONV  Airway Mallampati: II  TM Distance: >3 FB     Dental   Pulmonary asthma ,    breath sounds clear to auscultation       Cardiovascular negative cardio ROS   Rhythm:Regular  History of childhood heart murmur. No adult problems. CG   Neuro/Psych    GI/Hepatic Neg liver ROS, GERD  ,  Endo/Other  negative endocrine ROS  Renal/GU negative Renal ROS     Musculoskeletal  (+) Arthritis ,   Abdominal   Peds  Hematology   Anesthesia Other Findings   Reproductive/Obstetrics                            Anesthesia Physical Anesthesia Plan  ASA: III  Anesthesia Plan: General   Post-op Pain Management:    Induction: Intravenous  PONV Risk Score and Plan: 3 and Treatment may vary due to age or medical condition, Ondansetron, Dexamethasone and Midazolam  Airway Management Planned: Oral ETT  Additional Equipment:   Intra-op Plan:   Post-operative Plan: Possible Post-op intubation/ventilation  Informed Consent: I have reviewed the patients History and Physical, chart, labs and discussed the procedure including the risks, benefits and alternatives for the proposed anesthesia with the patient or authorized representative who has indicated his/her understanding and acceptance.     Dental advisory given  Plan Discussed with: Anesthesiologist and CRNA  Anesthesia Plan Comments:        Anesthesia Quick Evaluation

## 2018-09-12 ENCOUNTER — Encounter (HOSPITAL_COMMUNITY): Payer: Self-pay | Admitting: *Deleted

## 2018-09-12 ENCOUNTER — Inpatient Hospital Stay (HOSPITAL_COMMUNITY)
Admission: RE | Admit: 2018-09-12 | Discharge: 2018-09-13 | DRG: 328 | Disposition: A | Discharge status: Home / Self Care | Payer: BC Managed Care – PPO | Attending: General Surgery | Admitting: General Surgery

## 2018-09-12 ENCOUNTER — Inpatient Hospital Stay (HOSPITAL_COMMUNITY): Payer: BC Managed Care – PPO | Admitting: Certified Registered"

## 2018-09-12 ENCOUNTER — Encounter (HOSPITAL_COMMUNITY)
Admission: RE | Disposition: A | Discharge status: Home / Self Care | Payer: Self-pay | Source: Home / Self Care | Attending: General Surgery

## 2018-09-12 ENCOUNTER — Other Ambulatory Visit: Payer: Self-pay

## 2018-09-12 DIAGNOSIS — K2100 Gastro-esophageal reflux disease with esophagitis, without bleeding: Secondary | ICD-10-CM

## 2018-09-12 DIAGNOSIS — Z9884 Bariatric surgery status: Secondary | ICD-10-CM

## 2018-09-12 DIAGNOSIS — Z79899 Other long term (current) drug therapy: Secondary | ICD-10-CM

## 2018-09-12 DIAGNOSIS — Z6834 Body mass index (BMI) 34.0-34.9, adult: Secondary | ICD-10-CM

## 2018-09-12 DIAGNOSIS — Z8249 Family history of ischemic heart disease and other diseases of the circulatory system: Secondary | ICD-10-CM | POA: Diagnosis not present

## 2018-09-12 DIAGNOSIS — Z885 Allergy status to narcotic agent status: Secondary | ICD-10-CM

## 2018-09-12 DIAGNOSIS — K660 Peritoneal adhesions (postprocedural) (postinfection): Secondary | ICD-10-CM | POA: Diagnosis present

## 2018-09-12 DIAGNOSIS — Z91040 Latex allergy status: Secondary | ICD-10-CM | POA: Diagnosis not present

## 2018-09-12 DIAGNOSIS — Z833 Family history of diabetes mellitus: Secondary | ICD-10-CM | POA: Diagnosis not present

## 2018-09-12 DIAGNOSIS — K219 Gastro-esophageal reflux disease without esophagitis: Principal | ICD-10-CM | POA: Diagnosis present

## 2018-09-12 HISTORY — PX: GASTRIC ROUX-EN-Y: SHX5262

## 2018-09-12 LAB — HEMOGLOBIN AND HEMATOCRIT, BLOOD
HCT: 37.9 % (ref 36.0–46.0)
HEMOGLOBIN: 11.8 g/dL — AB (ref 12.0–15.0)

## 2018-09-12 SURGERY — LAPAROSCOPIC ROUX-EN-Y GASTRIC BYPASS WITH UPPER ENDOSCOPY
Anesthesia: General | Site: Abdomen

## 2018-09-12 MED ORDER — ONDANSETRON HCL 4 MG/2ML IJ SOLN
INTRAMUSCULAR | Status: DC | PRN
  Administered 2018-09-12: 4 mg via INTRAVENOUS

## 2018-09-12 MED ORDER — BUPIVACAINE LIPOSOME 1.3 % IJ SUSP
INTRAMUSCULAR | Status: DC | PRN
  Administered 2018-09-12: 20 mL

## 2018-09-12 MED ORDER — SUGAMMADEX SODIUM 200 MG/2ML IV SOLN
INTRAVENOUS | Status: AC
  Filled 2018-09-12: qty 2

## 2018-09-12 MED ORDER — PROPOFOL 10 MG/ML IV BOLUS
INTRAVENOUS | Status: AC
  Filled 2018-09-12: qty 20

## 2018-09-12 MED ORDER — LACTATED RINGERS IV SOLN
INTRAVENOUS | Status: DC | PRN
  Administered 2018-09-12: 07:00:00 via INTRAVENOUS

## 2018-09-12 MED ORDER — DEXAMETHASONE SODIUM PHOSPHATE 10 MG/ML IJ SOLN
INTRAMUSCULAR | Status: DC | PRN
  Administered 2018-09-12: 4 mg via INTRAVENOUS

## 2018-09-12 MED ORDER — SCOPOLAMINE 1 MG/3DAYS TD PT72
MEDICATED_PATCH | TRANSDERMAL | Status: DC | PRN
  Administered 2018-09-12: 1 via TRANSDERMAL

## 2018-09-12 MED ORDER — CIPROFLOXACIN IN D5W 400 MG/200ML IV SOLN
400.0000 mg | INTRAVENOUS | Status: AC
  Administered 2018-09-12: 400 mg via INTRAVENOUS
  Filled 2018-09-12: qty 200

## 2018-09-12 MED ORDER — KETAMINE HCL 10 MG/ML IJ SOLN
INTRAMUSCULAR | Status: AC
  Filled 2018-09-12: qty 1

## 2018-09-12 MED ORDER — MORPHINE SULFATE (PF) 4 MG/ML IV SOLN
1.0000 mg | INTRAVENOUS | Status: DC | PRN
  Administered 2018-09-12 (×2): 2 mg via INTRAVENOUS
  Filled 2018-09-12 (×2): qty 1

## 2018-09-12 MED ORDER — LIDOCAINE 2% (20 MG/ML) 5 ML SYRINGE: INTRAMUSCULAR | Status: DC | PRN

## 2018-09-12 MED ORDER — SUCCINYLCHOLINE CHLORIDE 200 MG/10ML IV SOSY
PREFILLED_SYRINGE | INTRAVENOUS | Status: DC | PRN
  Administered 2018-09-12: 120 mg via INTRAVENOUS

## 2018-09-12 MED ORDER — PROPOFOL 10 MG/ML IV BOLUS
INTRAVENOUS | Status: DC | PRN
  Administered 2018-09-12: 170 mg via INTRAVENOUS

## 2018-09-12 MED ORDER — OXYCODONE HCL 5 MG/5ML PO SOLN
5.0000 mg | ORAL | Status: DC | PRN
  Administered 2018-09-12 – 2018-09-13 (×5): 10 mg via ORAL
  Filled 2018-09-12 (×5): qty 10

## 2018-09-12 MED ORDER — ONDANSETRON HCL 4 MG/2ML IJ SOLN
4.0000 mg | INTRAMUSCULAR | Status: DC | PRN
  Administered 2018-09-12: 4 mg via INTRAVENOUS
  Filled 2018-09-12: qty 2

## 2018-09-12 MED ORDER — ONDANSETRON HCL 4 MG/2ML IJ SOLN
INTRAMUSCULAR | Status: AC
  Filled 2018-09-12: qty 2

## 2018-09-12 MED ORDER — LAMOTRIGINE 25 MG PO TABS
25.0000 mg | ORAL_TABLET | Freq: Every day | ORAL | Status: DC
  Administered 2018-09-13: 25 mg via ORAL
  Filled 2018-09-12: qty 1

## 2018-09-12 MED ORDER — ROCURONIUM BROMIDE 100 MG/10ML IV SOLN
INTRAVENOUS | Status: AC
  Filled 2018-09-12: qty 1

## 2018-09-12 MED ORDER — EVICEL 5 ML EX KIT
PACK | Freq: Once | CUTANEOUS | Status: AC
  Administered 2018-09-12: 5 mL
  Filled 2018-09-12: qty 1

## 2018-09-12 MED ORDER — ACETAMINOPHEN 500 MG PO TABS
1000.0000 mg | ORAL_TABLET | ORAL | Status: AC
  Administered 2018-09-12: 1000 mg via ORAL
  Filled 2018-09-12: qty 2

## 2018-09-12 MED ORDER — FENTANYL CITRATE (PF) 100 MCG/2ML IJ SOLN
25.0000 ug | INTRAMUSCULAR | Status: DC | PRN
  Administered 2018-09-12: 50 ug via INTRAVENOUS

## 2018-09-12 MED ORDER — KETAMINE HCL 10 MG/ML IJ SOLN
INTRAMUSCULAR | Status: DC | PRN
  Administered 2018-09-12: 10 mg via INTRAVENOUS
  Administered 2018-09-12: 15 mg via INTRAVENOUS

## 2018-09-12 MED ORDER — PANTOPRAZOLE SODIUM 40 MG IV SOLR
40.0000 mg | Freq: Every day | INTRAVENOUS | Status: DC
  Administered 2018-09-12: 40 mg via INTRAVENOUS
  Filled 2018-09-12: qty 40

## 2018-09-12 MED ORDER — LACTATED RINGERS IV SOLN
INTRAVENOUS | Status: DC
  Administered 2018-09-12: 07:00:00 via INTRAVENOUS

## 2018-09-12 MED ORDER — ACETAMINOPHEN 160 MG/5ML PO SOLN
650.0000 mg | Freq: Four times a day (QID) | ORAL | Status: DC
  Administered 2018-09-12 – 2018-09-13 (×4): 650 mg via ORAL
  Filled 2018-09-12 (×4): qty 20.3

## 2018-09-12 MED ORDER — GABAPENTIN 300 MG PO CAPS
300.0000 mg | ORAL_CAPSULE | ORAL | Status: DC
  Filled 2018-09-12: qty 1

## 2018-09-12 MED ORDER — EPHEDRINE 5 MG/ML INJ
INTRAVENOUS | Status: AC
  Filled 2018-09-12: qty 10

## 2018-09-12 MED ORDER — ENOXAPARIN SODIUM 30 MG/0.3ML ~~LOC~~ SOLN
30.0000 mg | Freq: Two times a day (BID) | SUBCUTANEOUS | Status: DC
  Administered 2018-09-12 – 2018-09-13 (×2): 30 mg via SUBCUTANEOUS
  Filled 2018-09-12 (×2): qty 0.3

## 2018-09-12 MED ORDER — FENTANYL CITRATE (PF) 250 MCG/5ML IJ SOLN
INTRAMUSCULAR | Status: DC | PRN
  Administered 2018-09-12: 100 ug via INTRAVENOUS
  Administered 2018-09-12 (×3): 50 ug via INTRAVENOUS

## 2018-09-12 MED ORDER — SODIUM CHLORIDE (PF) 0.9 % IJ SOLN
INTRAMUSCULAR | Status: DC | PRN
  Administered 2018-09-12: 50 mL

## 2018-09-12 MED ORDER — LIDOCAINE 2% (20 MG/ML) 5 ML SYRINGE
INTRAMUSCULAR | Status: AC
  Filled 2018-09-12: qty 5

## 2018-09-12 MED ORDER — CELECOXIB 200 MG PO CAPS
200.0000 mg | ORAL_CAPSULE | ORAL | Status: AC
  Administered 2018-09-12: 200 mg via ORAL
  Filled 2018-09-12: qty 1

## 2018-09-12 MED ORDER — MIDAZOLAM HCL 2 MG/2ML IJ SOLN
INTRAMUSCULAR | Status: AC
  Filled 2018-09-12: qty 2

## 2018-09-12 MED ORDER — PHENYLEPHRINE 40 MCG/ML (10ML) SYRINGE FOR IV PUSH (FOR BLOOD PRESSURE SUPPORT)
PREFILLED_SYRINGE | INTRAVENOUS | Status: DC | PRN
  Administered 2018-09-12 (×3): 40 ug via INTRAVENOUS

## 2018-09-12 MED ORDER — BUPIVACAINE HCL (PF) 0.25 % IJ SOLN
INTRAMUSCULAR | Status: DC | PRN
  Administered 2018-09-12: 30 mL

## 2018-09-12 MED ORDER — MIDAZOLAM HCL 2 MG/2ML IJ SOLN
INTRAMUSCULAR | Status: DC | PRN
  Administered 2018-09-12: 2 mg via INTRAVENOUS

## 2018-09-12 MED ORDER — DEXAMETHASONE SODIUM PHOSPHATE 10 MG/ML IJ SOLN
INTRAMUSCULAR | Status: AC
  Filled 2018-09-12: qty 1

## 2018-09-12 MED ORDER — STERILE WATER FOR IRRIGATION IR SOLN
Status: DC | PRN
  Administered 2018-09-12: 1000 mL

## 2018-09-12 MED ORDER — SCOPOLAMINE 1 MG/3DAYS TD PT72
MEDICATED_PATCH | TRANSDERMAL | Status: AC
  Filled 2018-09-12: qty 1

## 2018-09-12 MED ORDER — POTASSIUM CHLORIDE IN NACL 20-0.9 MEQ/L-% IV SOLN
INTRAVENOUS | Status: DC
  Administered 2018-09-12 – 2018-09-13 (×2): via INTRAVENOUS
  Filled 2018-09-12 (×2): qty 1000

## 2018-09-12 MED ORDER — BUPIVACAINE HCL (PF) 0.25 % IJ SOLN
INTRAMUSCULAR | Status: AC
  Filled 2018-09-12: qty 30

## 2018-09-12 MED ORDER — FENTANYL CITRATE (PF) 100 MCG/2ML IJ SOLN
INTRAMUSCULAR | Status: AC
  Filled 2018-09-12: qty 2

## 2018-09-12 MED ORDER — CHLORHEXIDINE GLUCONATE CLOTH 2 % EX PADS: 6.0000 | MEDICATED_PAD | Freq: Once | CUTANEOUS | Status: DC

## 2018-09-12 MED ORDER — SODIUM CHLORIDE (PF) 0.9 % IJ SOLN
INTRAMUSCULAR | Status: AC
  Filled 2018-09-12: qty 50

## 2018-09-12 MED ORDER — FENTANYL CITRATE (PF) 250 MCG/5ML IJ SOLN
INTRAMUSCULAR | Status: AC
  Filled 2018-09-12: qty 5

## 2018-09-12 MED ORDER — LIDOCAINE 2% (20 MG/ML) 5 ML SYRINGE
INTRAMUSCULAR | Status: DC | PRN
  Administered 2018-09-12: 50 mg via INTRAVENOUS

## 2018-09-12 MED ORDER — ENSURE MAX PROTEIN PO LIQD
2.0000 [oz_av] | ORAL | Status: DC
  Administered 2018-09-13 (×4): 2 [oz_av] via ORAL

## 2018-09-12 MED ORDER — LIDOCAINE 2% (20 MG/ML) 5 ML SYRINGE
INTRAMUSCULAR | Status: DC | PRN
  Administered 2018-09-12: 1.5 mg/kg/h via INTRAVENOUS

## 2018-09-12 MED ORDER — LACTATED RINGERS IR SOLN
Status: DC | PRN
  Administered 2018-09-12: 1000 mL

## 2018-09-12 MED ORDER — SUGAMMADEX SODIUM 200 MG/2ML IV SOLN
INTRAVENOUS | Status: DC | PRN
  Administered 2018-09-12: 200 mg via INTRAVENOUS

## 2018-09-12 MED ORDER — SODIUM CHLORIDE 0.9 % IR SOLN
Status: DC | PRN
  Administered 2018-09-12: 1000 mL

## 2018-09-12 MED ORDER — ROCURONIUM BROMIDE 10 MG/ML (PF) SYRINGE
PREFILLED_SYRINGE | INTRAVENOUS | Status: DC | PRN
  Administered 2018-09-12 (×4): 10 mg via INTRAVENOUS
  Administered 2018-09-12: 30 mg via INTRAVENOUS
  Administered 2018-09-12: 10 mg via INTRAVENOUS
  Administered 2018-09-12: 5 mg via INTRAVENOUS
  Administered 2018-09-12: 10 mg via INTRAVENOUS

## 2018-09-12 SURGICAL SUPPLY — 80 items
ADH SKN CLS APL DERMABOND .7 (GAUZE/BANDAGES/DRESSINGS) ×1
APL SWBSTK 6 STRL LF DISP (MISCELLANEOUS) ×2
APPLICATOR COTTON TIP 6 STRL (MISCELLANEOUS) ×2 IMPLANT
APPLICATOR COTTON TIP 6IN STRL (MISCELLANEOUS) ×4
APPLIER CLIP ROT 10 11.4 M/L (STAPLE)
APPLIER CLIP ROT 13.4 12 LRG (CLIP)
APR CLP LRG 13.4X12 ROT 20 MLT (CLIP)
APR CLP MED LRG 11.4X10 (STAPLE)
BINDER ABDOMINAL 12 ML 46-62 (SOFTGOODS) ×1 IMPLANT
BLADE SURG SZ11 CARB STEEL (BLADE) ×2 IMPLANT
CABLE HIGH FREQUENCY MONO STRZ (ELECTRODE) ×2 IMPLANT
CHLORAPREP W/TINT 26ML (MISCELLANEOUS) ×2 IMPLANT
CLIP APPLIE ROT 10 11.4 M/L (STAPLE) IMPLANT
CLIP APPLIE ROT 13.4 12 LRG (CLIP) IMPLANT
CLIP SUT LAPRA TY ABSORB (SUTURE) ×4 IMPLANT
COVER WAND RF STERILE (DRAPES) ×2 IMPLANT
DECANTER SPIKE VIAL GLASS SM (MISCELLANEOUS) ×2 IMPLANT
DERMABOND ADVANCED (GAUZE/BANDAGES/DRESSINGS) ×1
DERMABOND ADVANCED .7 DNX12 (GAUZE/BANDAGES/DRESSINGS) ×1 IMPLANT
DEVICE SUT QUICK LOAD TK 5 (STAPLE) IMPLANT
DEVICE SUT TI-KNOT TK 5X26 (MISCELLANEOUS) IMPLANT
DEVICE SUTURE ENDOST 10MM (ENDOMECHANICALS) ×2 IMPLANT
DISSECTOR BLUNT TIP ENDO 5MM (MISCELLANEOUS) IMPLANT
DRAIN PENROSE .75X.25X12 SILI (WOUND CARE) ×1 IMPLANT
ELECT REM PT RETURN 15FT ADLT (MISCELLANEOUS) ×2 IMPLANT
GAUZE 4X4 16PLY RFD (DISPOSABLE) ×2 IMPLANT
GLOVE BIOGEL PI IND STRL 7.5 (GLOVE) ×1 IMPLANT
GLOVE BIOGEL PI INDICATOR 7.5 (GLOVE) ×1
GLOVE ECLIPSE 7.5 STRL STRAW (GLOVE) ×1 IMPLANT
GOWN STRL REUS W/TWL XL LVL3 (GOWN DISPOSABLE) ×4 IMPLANT
HEMOSTAT SURGICEL 4X8 (HEMOSTASIS) IMPLANT
HOVERMATT SINGLE USE (MISCELLANEOUS) ×2 IMPLANT
KIT BASIN OR (CUSTOM PROCEDURE TRAY) ×2 IMPLANT
KIT GASTRIC LAVAGE 34FR ADT (SET/KITS/TRAYS/PACK) ×2 IMPLANT
MARKER SKIN DUAL TIP RULER LAB (MISCELLANEOUS) ×2 IMPLANT
NDL SPNL 22GX3.5 QUINCKE BK (NEEDLE) ×1 IMPLANT
NEEDLE SPNL 22GX3.5 QUINCKE BK (NEEDLE) ×2 IMPLANT
PACK CARDIOVASCULAR III (CUSTOM PROCEDURE TRAY) ×2 IMPLANT
RELOAD ENDO STITCH 2.0 (ENDOMECHANICALS) ×20
RELOAD STAPLE 60 2.6 WHT THN (STAPLE) ×2 IMPLANT
RELOAD STAPLE 60 3.6 BLU REG (STAPLE) ×3 IMPLANT
RELOAD STAPLE 60 3.8 GOLD REG (STAPLE) ×1 IMPLANT
RELOAD STAPLER BLUE 60MM (STAPLE) ×3 IMPLANT
RELOAD STAPLER GOLD 60MM (STAPLE) ×1 IMPLANT
RELOAD STAPLER WHITE 60MM (STAPLE) ×2 IMPLANT
RELOAD SUT SNGL STCH ABSRB 2-0 (ENDOMECHANICALS) ×5 IMPLANT
RELOAD SUT SNGL STCH BLK 2-0 (ENDOMECHANICALS) ×5 IMPLANT
SCISSORS LAP 5X45 EPIX DISP (ENDOMECHANICALS) ×2 IMPLANT
SET IRRIG TUBING LAPAROSCOPIC (IRRIGATION / IRRIGATOR) ×2 IMPLANT
SET TUBE SMOKE EVAC HIGH FLOW (TUBING) ×2 IMPLANT
SHEARS HARMONIC ACE PLUS 45CM (MISCELLANEOUS) ×2 IMPLANT
SLEEVE ADV FIXATION 12X100MM (TROCAR) ×4 IMPLANT
SLEEVE ADV FIXATION 5X100MM (TROCAR) ×1 IMPLANT
SOLUTION ANTI FOG 6CC (MISCELLANEOUS) ×2 IMPLANT
STAPLER ECHELON LONG 60 440 (INSTRUMENTS) ×1 IMPLANT
STAPLER RELOAD BLUE 60MM (STAPLE) ×6
STAPLER RELOAD GOLD 60MM (STAPLE) ×2
STAPLER RELOAD WHITE 60MM (STAPLE) ×4
SUT MNCRL AB 4-0 PS2 18 (SUTURE) ×2 IMPLANT
SUT RELOAD ENDO STITCH 2 48X1 (ENDOMECHANICALS) ×5
SUT RELOAD ENDO STITCH 2.0 (ENDOMECHANICALS) ×5
SUT SURGIDAC NAB ES-9 0 48 120 (SUTURE) IMPLANT
SUT VIC AB 2-0 SH 27 (SUTURE)
SUT VIC AB 2-0 SH 27X BRD (SUTURE) IMPLANT
SUTURE RELOAD END STTCH 2 48X1 (ENDOMECHANICALS) ×5 IMPLANT
SUTURE RELOAD ENDO STITCH 2.0 (ENDOMECHANICALS) ×5 IMPLANT
SYR 10ML ECCENTRIC (SYRINGE) ×2 IMPLANT
SYR 20CC LL (SYRINGE) ×4 IMPLANT
SYR 50ML LL SCALE MARK (SYRINGE) ×2 IMPLANT
TIP RIGID 35CM EVICEL (HEMOSTASIS) ×2 IMPLANT
TOWEL OR 17X26 10 PK STRL BLUE (TOWEL DISPOSABLE) ×2 IMPLANT
TOWEL OR NON WOVEN STRL DISP B (DISPOSABLE) ×2 IMPLANT
TRAY FOLEY METER SIL LF 16FR (CATHETERS) ×1 IMPLANT
TRAY FOLEY MTR SLVR 14FR STAT (SET/KITS/TRAYS/PACK) IMPLANT
TROCAR ADV FIXATION 12X100MM (TROCAR) ×2 IMPLANT
TROCAR ADV FIXATION 5X100MM (TROCAR) ×2 IMPLANT
TROCAR BLADELESS OPT 5 100 (ENDOMECHANICALS) ×5 IMPLANT
TROCAR XCEL 12X100 BLDLESS (ENDOMECHANICALS) ×2 IMPLANT
TUBE CALIBRATION LAPBAND (TUBING) IMPLANT
TUBING CONNECTING 10 (TUBING) ×4 IMPLANT

## 2018-09-12 NOTE — Op Note (Signed)
Name:  Robyn Bryant MRN: [id number] Date of Surgery: 09/12/2018  Preop Diagnosis:  Morbid Obesity, S/P RYGB  Postop Diagnosis:  Morbid Obesity, S/P RYGB (Weight - 200, BMI - 34.7)  Procedure:  Upper endoscopy  (Intraoperative)  Surgeon:  Ovidio Kin, M.D.  Anesthesia:  GET  Indications for procedure: Robyn Bryant is a 43 y.o. female whose primary care physician is Etta Grandchild, MD and has completed a conversion of a lap sleeve gastrectomy to a  Roux-en-Y gastric bypass today by Dr. Johna Sheriff.  Her sleeve gastrectomy was done 08/08/2013.  I am doing an intraoperative upper endoscopy to evaluate the gastric pouch and the gastro-jejunal anastomosis.  Operative Note: The patient is under general anesthesia.  Dr. Johna Sheriff is laparoscoping the patient while I do an upper endoscopy to evaluate the stomach pouch and gastrojejunal anastomosis.  With the patient intubated, I passed the Olympus endoscope without difficulty down the esophagus.  The esophago-gastric junction was at 37 cm.  The gastro-jejunal anastomosis was at 41 cm.  The mucosa of the stomach looked viable and the staple line was intact without bleeding.  The gastro-jejunal anastomosis looked okay.  While I insufflated the stomach pouch with air, Dr. Johna Sheriff clamped off the efferent limb of the jejunum.  He then flooded the upper abdomen with saline to put the gastric pouch and gastro-jejunal anastomosis under saline.  There was no bubbling or evidence of a leak.    The scope was then withdrawn.  The esophagus was unremarkable and the patient tolerated the endoscopy without difficulty.  Ovidio Kin, MD, [facility] Surgery Pager: [phone] Office phone:  [phone]

## 2018-09-12 NOTE — Discharge Instructions (Signed)
° ° ° °GASTRIC BYPASS/SLEEVE ° Home Care Instructions ° ° These instructions are to help you care for yourself when you go home. ° °Call: If you have any problems. °• Call [phone] and ask for the surgeon on call °• If you need immediate help, come to the ER at [facility].  °• Tell the ER staff that you are a new post-op gastric bypass or gastric sleeve patient °  °Signs and symptoms to report: • Severe vomiting or nausea °o If you cannot keep down clear liquids for longer than 1 day, call your surgeon  °• Abdominal pain that does not get better after taking your pain medication °• Fever over 100.4° F with chills °• Heart beating over 100 beats a minute °• Shortness of breath at rest °• Chest pain °•  Redness, swelling, drainage, or foul odor at incision (surgical) sites °•  If your incisions open or pull apart °• Swelling or pain in calf (lower leg) °• Diarrhea (Loose bowel movements that happen often), frequent watery, uncontrolled bowel movements °• Constipation, (no bowel movements for 3 days) if this happens: Pick one °o Milk of Magnesia, 2 tablespoons by mouth, 3 times a day for 2 days if needed °o Stop taking Milk of Magnesia once you have a bowel movement °o Call your doctor if constipation continues °Or °o Miralax  (instead of Milk of Magnesia) following the label instructions °o Stop taking Miralax once you have a bowel movement °o Call your doctor if constipation continues °• Anything you think is not normal °  °Normal side effects after surgery: • Unable to sleep at night or unable to focus °• Irritability or moody °• Being tearful (crying) or depressed °These are common complaints, possibly related to your anesthesia medications that put you to sleep, stress of surgery, and change in lifestyle.  This usually goes away a few weeks after surgery.  If these feelings continue, call your primary care doctor. °  °Wound Care: You may have surgical glue, steri-strips, or staples over your incisions after  surgery °• Surgical glue:  Looks like a clear film over your incisions and will wear off a little at a time °• Steri-strips: Strips of tape over your incisions. You may notice a yellowish color on the skin under the steri-strips. This is used to make the   steri-strips stick better. Do not pull the steri-strips off - let them fall off °• Staples: Staples may be removed before you leave the hospital °o If you go home with staples, call [facility], [phone] at for an appointment with your surgeon’s nurse to have staples removed 10 days after surgery. °• Showering: You may shower two (2) days after your surgery unless your surgeon tells you differently °o Wash gently around incisions with warm soapy water, rinse well, and gently pat dry  °o No tub baths until staples are removed, steri-strips fall off or glue is gone.  °  °Medications: • Medications should be liquid or crushed if larger than the size of a dime °• Extended release pills (medication that release a little bit at a time through the day) should NOT be crushed or cut. (examples include XL, ER, DR, SR) °• Depending on the size and number of medications you take, you may need to space (take a few throughout the day)/change the time you take your medications so that you do not over-fill your pouch (smaller stomach) °• Make sure you follow-up with your primary care doctor to   make medication changes needed during rapid weight loss and life-style changes °• If you have diabetes, follow up with the doctor that orders your diabetes medication(s) within one week after surgery and check your blood sugar regularly. °• Do not drive while taking prescription pain medication  °• It is ok to take Tylenol by the bottle instructions with your pain medicine or instead of your pain medicine as needed.  DO NOT TAKE NSAIDS (EXAMPLES OF NSAIDS:  IBUPROFREN/ NAPROXEN)  °Diet:                    First 2 Weeks ° You will see the dietician t about two (2) weeks  after your surgery. The dietician will increase the types of foods you can eat if you are handling liquids well: °• If you have severe vomiting or nausea and cannot keep down clear liquids lasting longer than 1 day, call your surgeon @ ([phone]) °Protein Shake °• Drink at least 2 ounces of shake 5-6 times per day °• Each serving of protein shakes (usually 8 - 12 ounces) should have: °o 15 grams of protein  °o And no more than 5 grams of carbohydrate  °• Goal for protein each day: °o Men = 80 grams per day °o Women = 60 grams per day °• Protein powder may be added to fluids such as non-fat milk or Lactaid milk or unsweetened Soy/Almond milk (limit to 35 grams added protein powder per serving) ° °Hydration °• Slowly increase the amount of water and other clear liquids as tolerated (See Acceptable Fluids) °• Slowly increase the amount of protein shake as tolerated  °•  Sip fluids slowly and throughout the day.  Do not use straws. °• May use sugar substitutes in small amounts (no more than 6 - 8 packets per day; i.e. Splenda) ° °Fluid Goal °• The first goal is to drink at least 8 ounces of protein shake/drink per day (or as directed by the nutritionist); some examples of protein shakes are Syntrax Nectar, Adkins Advantage, EAS Edge HP, and Unjury. See handout from pre-op Bariatric Education Class: °o Slowly increase the amount of protein shake you drink as tolerated °o You may find it easier to slowly sip shakes throughout the day °o It is important to get your proteins in first °• Your fluid goal is to drink 64 - 100 ounces of fluid daily °o It may take a few weeks to build up to this °• 32 oz (or more) should be clear liquids  °And  °• 32 oz (or more) should be full liquids (see below for examples) °• Liquids should not contain sugar, caffeine, or carbonation ° °Clear Liquids: °• Water or Sugar-free flavored water (i.e. Fruit H2O, Propel) °• Decaffeinated coffee or tea (sugar-free) °• Crystal Lite, Wyler’s Lite,  Minute Maid Lite °• Sugar-free Jell-O °• Bouillon or broth °• Sugar-free Popsicle:   *Less than 20 calories each; Limit 1 per day ° °Full Liquids: °Protein Shakes/Drinks + 2 choices per day of other full liquids °• Full liquids must be: °o No More Than 15 grams of Carbs per serving  °o No More Than 3 grams of Fat per serving °• Strained low-fat cream soup (except Cream of Potato or Tomato) °• Non-Fat milk °• Fat-free Lactaid Milk °• Unsweetened Soy Or Unsweetened Almond Milk °• Low Sugar yogurt (Dannon Lite & Fit, Greek yogurt; Oikos Triple Zero; Chobani Simply 100; Yoplait 100 calorie Greek - No Fruit on the Bottom) ° °  °Vitamins   and Minerals • Start 1 day after surgery unless otherwise directed by your surgeon °• 2 Chewable Bariatric Specific Multivitamin / Multimineral Supplement with iron (Example: Bariatric Advantage Multi EA) °• Chewable Calcium with Vitamin D-3 °(Example: 3 Chewable Calcium Plus 600 with Vitamin D-3) °o Take 500 mg three (3) times a day for a total of 1500 mg each day °o Do not take all 3 doses of calcium at one time as it may cause constipation, and you can only absorb 500 mg  at a time  °o Do not mix multivitamins containing iron with calcium supplements; take 2 hours apart °• Menstruating women and those with a history of anemia (a blood disease that causes weakness) may need extra iron °o Talk with your doctor to see if you need more iron °• Do not stop taking or change any vitamins or minerals until you talk to your dietitian or surgeon °• Your Dietitian and/or surgeon must approve all vitamin and mineral supplements °  °Activity and Exercise: Limit your physical activity as instructed by your doctor.  It is important to continue walking at home.  During this time, use these guidelines: °• Do not lift anything greater than ten (10) pounds for at least two (2) weeks °• Do not go back to work or drive until your surgeon says you can °• You may have sex when you feel comfortable  °o It is  VERY important for female patients to use a reliable birth control method; fertility often increases after surgery  °o All hormonal birth control will be ineffective for 30 days after surgery due to medications given during surgery a barrier method must be used. °o Do not get pregnant for at least 18 months °• Start exercising as soon as your doctor tells you that you can °o Make sure your doctor approves any physical activity °• Start with a simple walking program °• Walk 5-15 minutes each day, 7 days per week.  °• Slowly increase until you are walking 30-45 minutes per day °Consider joining our BELT program. [phone] or email [email or web address] °  °Special Instructions Things to remember: °• Use your CPAP when sleeping if this applies to you ° °• [facility] has two free Bariatric Surgery Support Groups that meet monthly °o The 3rd Thursday of each month, 6 pm, [facility]  °o The 2nd Friday of each month, 11:45 am in the private dining room in the basement of  °• It is very important to keep all follow up appointments with your surgeon, dietitian, primary care physician, and behavioral health practitioner °• Routine follow up schedule with your surgeon include appointments at 2-3 weeks, 6-8 weeks, 6 months, and 1 year at a minimum.  Your surgeon may request to see you more often.   °o After the first year, please follow up with your bariatric surgeon and dietitian at least once a year in order to maintain best weight loss results °[facility]: [phone] °[facility]: [phone] °Bariatric Nurse Coordinator: [phone] °  °   Reviewed and Endorsed  °by [facility] Patient Education Committee, June, 2016 °Edits Approved: Aug, 2018 ° ° ° °

## 2018-09-12 NOTE — Op Note (Signed)
Preoperative Diagnosis: H O Gastric Sleeve, GERD  Postoprative Diagnosis: H O Gastric Sleeve, GERD  Procedure: Procedure(s): LAPAROSCOPIC ROUX-EN-Y GASTRIC BYPASS REVISION FROM GASTRIC SLEEVE WITH UPPER ENDOSCOPY, ERAS Pathway   Surgeon: Glenna Fellows T   Assistants: Ovidio Kin  Anesthesia:  General endotracheal anesthesia  Indications: Patient is approximately 1 year status post laparoscopic Roux-en-Y gastric bypass with excellent weight loss but persistent severe GERD with negative upper GI series and endoscopy other than evidence of reflux but no hiatal hernia or stricture.  After extensive preoperative work-up and discussion detailed elsewhere we have elected to proceed with conversion to Roux-en-Y gastric bypass due to her severe GERD.    Procedure Detail: Patient was brought to the operating room, placed in the supine position on the operating table, and general endotracheal anesthesia induced.  She had received preoperative IV antibiotics and subcutaneous heparin.  PAS were in place.  Foley catheter was placed.  The abdomen was widely sterilely prepped and draped.  Patient timeout was performed and correct procedure verified.  Access was obtained with a 5 mm Optiview trocar in the left upper quadrant and pneumoperitoneum established without difficulty.  No evidence of trocar injury.  Under direct vision a 5 mm trocar was placed laterally in the right upper quadrant, a 12 mm trocar in the right mid upper abdomen a 5 mm trocar just to the left of the umbilicus for the camera port and additional 5 mm trocar in the left upper quadrant.  Patient was placed in reverse Trendelenburg and through a 5 mm subxiphoid site the [facility] retractor was placed in the left lobe the liver elevated with excellent exposure of the stomach.  A bilateral T AP block was performed with dilute Exparel.  The gastric sleeve appeared unremarkable.  Minimal scarring.  A point 5 cm  from the EG junction was chosen  to begin the gastric pouch.  We did not see any evidence of a hiatal hernia.  The lesser curve was dissected in a perigastric fashion with the harmonic scalpel and the lesser sac entered.  The sleeve was easily traversed over to the staple line.  The pouch was created with an essentially single firing of the gold load 60 mm stapler across the pouch and completing the small remaining area with a 60 mm blue load stapler.  Staple line of the gastric remnant was oversewn with 2-0 silk.  Patient was then placed flat and ligament of Treitz identified.  There were some omental adhesions in the pelvis taken down from the anterior abdominal wall with the harmonic scalpel.  A 40 cm biliopancreatic limb was measured from the ligament of Treitz and the small bowel divided with a single firing of the 60 mm white load stapler.  A 100 cm Roux limb was carefully measured and at this point a side-to-side anastomosis was created at the end of the biliopancreatic limb to the side of the Roux limb with a single firing of the white load 60 mm stapler.  The staple line was intact and without bleeding.  The common enterotomy was closed from either end with running 2-0 Vicryl.  The mesenteric defect was closed with running 2-0 silk.  Patient was placed back in reverse Trendelenburg.  The omentum was divided up to the transverse colon with harmonic scalpel.  The Roux limb was easily brought up to the gastric pouch.  An initial posterior layer of running 2-0 Vicryl was placed between the Roux limb with the candycane pointing to the patient's left  suturing to the staple line of the gastric remnant.  The Ewald tube was then passed distally into the gastric pouch and enterotomy was made in the pouch and the Roux limb.  An approximately 3 and half centimeter anastomosis was then created with a single firing of the blue load 60 mm stapler.  The staple line was intact and without bleeding.  The common enterotomy was closed from either end with  running 2-0 Vicryl.  The Ewald tube was passed through the anastomosis and an anterior row of running 2-0 Vicryl was placed.  The Vonita Moss defect was closed suturing the transverse mesocolon over to the edge of the small bowel mesentery with 2-0 silk.  Dr. Ezzard Standing then performed upper endoscopy showing a nice tubular 4 to 5 cm pouch with a patent anastomosis and no bleeding.  Under tense insufflation with the outlet clamped under saline irrigation there was no evidence of leak.  Suture staple lines were coated with Evaseal.  The abdomen was carefully inspected for hemostasis or injury and everything looked fine.  The Nathanson retractor was removed under direct vision.  All CO2 was evacuated and trochars removed.  Skin incisions were closed with subcuticular Monocryl and Dermabond.  Sponge needle and instrument counts were correct.    Findings: As above  Estimated Blood Loss:  Minimal         Drains: None  Blood Given: none          Specimens: None        Complications:  * No complications entered in OR log *         Disposition: PACU - hemodynamically stable.         Condition: stable

## 2018-09-12 NOTE — Transfer of Care (Signed)
Immediate Anesthesia Transfer of Care Note  Patient: Robyn Bryant  Procedure(s) Performed: LAPAROSCOPIC ROUX-EN-Y GASTRIC BYPASS REVISION FROM GASTRIC SLEEVE WITH UPPER ENDOSCOPY, ERAS Pathway (N/A Abdomen)  Patient Location: PACU  Anesthesia Type:General  Level of Consciousness: awake, alert  and oriented  Airway & Oxygen Therapy: Patient Spontanous Breathing and Patient connected to face mask oxygen  Post-op Assessment: Report given to RN and Post -op Vital signs reviewed and stable  Post vital signs: Reviewed and stable  Last Vitals:  Vitals Value Taken Time  BP 129/90 09/12/2018 10:30 AM  Temp    Pulse 75 09/12/2018 10:31 AM  Resp 16 09/12/2018 10:31 AM  SpO2 100 % 09/12/2018 10:31 AM  Vitals shown include unvalidated device data.  Last Pain:  Vitals:   09/12/18 0538  TempSrc: Oral  PainSc: 5       Patients Stated Pain Goal: 5 (09/12/18 0538)  Complications: No apparent anesthesia complications

## 2018-09-12 NOTE — Interval H&P Note (Signed)
History and Physical Interval Note:  09/12/2018 7:16 AM  Robyn Bryant  has presented today for surgery, with the diagnosis of H/O Gastric Sleeve, GERD  The various methods of treatment have been discussed with the patient and family. After consideration of risks, benefits and other options for treatment, the patient has consented to  Procedure(s): LAPAROSCOPIC ROUX-EN-Y GASTRIC BYPASS WITH UPPER ENDOSCOPY, ERAS Pathway (N/A) as a surgical intervention .  The patient's history has been reviewed, patient examined, no change in status, stable for surgery.  I have reviewed the patient's chart and labs.  Questions were answered to the patient's satisfaction.     Lorne Skeens Costa Jha

## 2018-09-12 NOTE — H&P (Signed)
History of Present Illness  The patient is a 43 year old female presenting status-post bariatric surgery. Patient returns for long-term follow-up with history of laparoscopic sleeve gastrectomy in December 2014. She followed up for about 6 months but then was lost to follow-up she states due to insurance issues. She recently returned with complaints of significant reflux. This includes heartburn after meals and water brash and regurgitation occasionally at night that awakens her.  We proceeded with a workup that included upper endoscopy by Dr. Ezzard Standing which showed some slight bile pooling in the upper sleeve but no definite abnormalities. Upper GI series showed normal sleeve anatomy. No hiatal hernia but "patulous" EG junction with reflux noted. CLOtest was negative.  The spring we increased her Protonix to 40 mg twice a day. She had some temporary improvement but now is again experiencing severe reflux with daily heartburn and occasional night time regurgitation and water brash. This is despite maximal medical management with medication and diet control.     Problem List/Past Medical  PANNICULITIS (M79.3)  CHRONIC GERD (K21.9)  S/P LAPAROSCOPIC SLEEVE GASTRECTOMY (Z98.84)   Past Surgical History  Cesarean Section - 1  Gallbladder Surgery - Laparoscopic  Sleeve Gastrectomy   Diagnostic Studies History  Colonoscopy  never Mammogram  within last year Pap Smear  1-5 years ago  Allergies Morphine Sulfate *ANALGESICS - OPIOID*  Latex  Dilaudid *ANALGESICS - OPIOID*  Allergies Reconciled   Medication History  Protonix (40MG  Tablet DR, 1 Oral twice a day, Taken starting 12/01/2017) Active. Trokendi XR (200MG  Capsule ER 24HR, Oral) Active. Medications Reconciled  Social History  Alcohol use  Occasional alcohol use. Caffeine use  Tea. No drug use  Tobacco use  Never smoker.  Family History Alcohol Abuse  Father. Diabetes Mellitus  Mother. Hypertension   Mother. Thyroid problems  Mother.  Pregnancy / Birth History  Age at menarche  12 years. Gravida  7 Length (months) of breastfeeding  3-6 Maternal age  <15 Para  5 Regular periods   Other Problems  Back Pain  Gastroesophageal Reflux Disease  Migraine Headache   PE BP 121/78   Pulse 84   Temp 98.6 F (37 C) (Oral)   Resp 16   Ht 5\' 4"  (1.626 m)   Wt 91.8 kg   LMP 09/06/2018 (Exact Date) Comment: "bilateral Tubal ligation 2001" per Pt, denies pregnancy  SpO2 98%   BMI 34.74 kg/m  Note:General: Alert, moderately obese female in no distress Skin: Warm and dry without rash or infection. HEENT: No palpable masses or thyromegaly. Sclera nonicteric. Pupils equal round and reactive. Lymph nodes: No cervical, supraclavicular, or inguinal nodes palpable. Lungs: Breath sounds clear and equal. No wheezing or increased work of breathing. Cardiovascular: Regular rate and rhythm without murmer. No JVD or edema. Abdomen: Nondistended. Soft and nontender. No masses palpable. No organomegaly. No palpable hernias. Extremities: No edema or joint swelling or deformity. No chronic venous stasis changes. Neurologic: Alert and fully oriented. Gait normal. No focal weakness. Psychiatric: Normal mood and affect. Thought content appropriate with normal judgement and insight    Assessment & Plan  CHRONIC GERD (K21.9) Impression: Severe GERD with workup as above status post sleeve gastrectomy. Not controlled with medications and conservative management. She is going to require conversion to Roux-en-Y gastric bypass. We had discussed this previously and she has come to the same conclusion. We discussed the nature of the problem. We discussed the nature of gastric bypass in detail including risks of staple line leak, bleeding, infection  and rare chance of death, DVT, anesthetic complications and side effects such as dumping and changes in bowel habits. Discussed risk of long-term nutritional  deficiencies and risk of bowel obstruction. She is currently in an intolerable situation and ready to proceed with surgery.  Current Plans Laparoscopic conversion to sleeve gastrectomy to Roux-en-Y gastric bypass

## 2018-09-12 NOTE — Progress Notes (Signed)
Discussed post op day goals with patient including ambulation, IS, diet progression, pain, and nausea control.  Questions answered. 

## 2018-09-12 NOTE — Anesthesia Postprocedure Evaluation (Signed)
Anesthesia Post Note  Patient: Robyn Bryant  Procedure(s) Performed: LAPAROSCOPIC ROUX-EN-Y GASTRIC BYPASS REVISION FROM GASTRIC SLEEVE WITH UPPER ENDOSCOPY, ERAS Pathway (N/A Abdomen)     Patient location during evaluation: PACU Anesthesia Type: General Level of consciousness: awake Pain management: pain level controlled Vital Signs Assessment: post-procedure vital signs reviewed and stable Respiratory status: spontaneous breathing Cardiovascular status: stable Postop Assessment: no apparent nausea or vomiting Anesthetic complications: no    Last Vitals:  Vitals:   09/12/18 1306 09/12/18 1402  BP: 123/81 135/69  Pulse: 65 63  Resp:  14  Temp: (!) 36.3 C (!) 36.3 C  SpO2: 100% 100%    Last Pain:  Vitals:   09/12/18 1447  TempSrc:   PainSc: 2                  Martavious Hartel

## 2018-09-12 NOTE — Anesthesia Procedure Notes (Signed)
Procedure Name: Intubation Date/Time: 09/12/2018 7:35 AM Performed by: Eben Burow, CRNA Pre-anesthesia Checklist: Patient identified, Emergency Drugs available, Suction available, Patient being monitored and Timeout performed Patient Re-evaluated:Patient Re-evaluated prior to induction Oxygen Delivery Method: Circle system utilized Preoxygenation: Pre-oxygenation with 100% oxygen Induction Type: IV induction, Rapid sequence and Cricoid Pressure applied Laryngoscope Size: Mac and 4 Grade View: Grade I Tube type: Oral Tube size: 7.0 mm Number of attempts: 1 Airway Equipment and Method: Stylet Placement Confirmation: ETT inserted through vocal cords under direct vision,  positive ETCO2 and breath sounds checked- equal and bilateral Secured at: 22 cm Tube secured with: Tape Dental Injury: Teeth and Oropharynx as per pre-operative assessment

## 2018-09-12 NOTE — Progress Notes (Addendum)
PHARMACY CONSULT FOR:  Risk Assessment for Post-Discharge VTE Following Bariatric Surgery  Post-Discharge VTE Risk Assessment: This patient's probability of 30-day post-discharge VTE is increased due to the factors marked:   Female    Age >/=60 years    BMI >/=50 kg/m2    CHF    Dyspnea at Rest    Paraplegia   X Non-gastric-band surgery    Operation Time >/=3 hr    Return to OR     Length of Stay >/= 3 d   Predicted probability of 30-day post-discharge VTE: 0.16%  Other patient-specific factors to consider:  Recommendation for Discharge: No pharmacologic prophylaxis post-discharge  Robyn Bryant is a 43 y.o. female who underwent  Roux-En-Y Gastric Bypass Graft revision of Gastric sleeve on 09/12/2018(date)   Case start: 0801 Case end: 1017   Allergies  Allergen Reactions  . Morphine And Related Itching and Other (See Comments)    Pt can tolerate with Benadryl.   . Amoxicillin Itching  . Latex Hives  . Dilaudid [Hydromorphone] Itching and Other (See Comments)    Pt can tolerate with Benadryl.   . Gabapentin     Feel dizzy   Patient Measurements: Height: 5\' 4"  (162.6 cm) Weight: 202 lb 6 oz (91.8 kg) IBW/kg (Calculated) : 54.7 Body mass index is 34.74 kg/m.  No results for input(s): WBC, HGB, HCT, PLT, APTT, CREATININE, LABCREA, CREATININE, CREAT24HRUR, MG, PHOS, ALBUMIN, PROT, ALBUMIN, AST, ALT, ALKPHOS, BILITOT, BILIDIR, IBILI in the last 72 hours. CrCl cannot be calculated (Patient's most recent lab result is older than the maximum 21 days allowed.).  Past Medical History:  Diagnosis Date  . Anxiety   . Arthritis    Bilateral knees, wrist  . Asthma    as teenager, none now  pt.denies  . Depression   . GERD (gastroesophageal reflux disease)   . History of carpal tunnel syndrome   . History of seizure    noted on  EEG, trigger by migraines  . Insomnia    resolved  . Low back pain    resolved  . Migraines   . Panniculitis   . PONV (postoperative nausea  and vomiting)   . Thiamine deficiency   . Toe fracture, left 12/26/2015  . Vitamin B 12 deficiency   . Vitamin D deficiency    pt unaware   Medications Prior to Admission  Medication Sig Dispense Refill Last Dose  . Galcanezumab-gnlm (EMGALITY) 120 MG/ML SOAJ Inject 120 mg into the skin every 30 (thirty) days.   09/09/2018  . lamoTRIgine (LAMICTAL) 25 MG tablet Take 25 mg by mouth daily.   09/12/2018 at 0440  . pantoprazole (PROTONIX) 40 MG tablet Take 40 mg by mouth 2 (two) times daily.    09/12/2018 at 0440  . acetaminophen (TYLENOL) 325 MG tablet Take 650 mg by mouth every 6 (six) hours as needed.   More than a month at Unknown time   Otho Bellows 09/12/2018,11:57 AM

## 2018-09-13 ENCOUNTER — Encounter (HOSPITAL_COMMUNITY): Payer: Self-pay | Admitting: General Surgery

## 2018-09-13 LAB — CBC WITH DIFFERENTIAL/PLATELET
Abs Immature Granulocytes: 0.03 10*3/uL (ref 0.00–0.07)
Basophils Absolute: 0 10*3/uL (ref 0.0–0.1)
Basophils Relative: 1 %
Eosinophils Absolute: 0.1 10*3/uL (ref 0.0–0.5)
Eosinophils Relative: 1 %
HCT: 34.5 % — ABNORMAL LOW (ref 36.0–46.0)
Hemoglobin: 10.7 g/dL — ABNORMAL LOW (ref 12.0–15.0)
Immature Granulocytes: 0 %
Lymphocytes Relative: 32 %
Lymphs Abs: 2.5 10*3/uL (ref 0.7–4.0)
MCH: 27.1 pg (ref 26.0–34.0)
MCHC: 31 g/dL (ref 30.0–36.0)
MCV: 87.3 fL (ref 80.0–100.0)
Monocytes Absolute: 0.7 10*3/uL (ref 0.1–1.0)
Monocytes Relative: 9 %
Neutro Abs: 4.6 10*3/uL (ref 1.7–7.7)
Neutrophils Relative %: 57 %
Platelets: 260 10*3/uL (ref 150–400)
RBC: 3.95 MIL/uL (ref 3.87–5.11)
RDW: 13.9 % (ref 11.5–15.5)
WBC: 7.9 10*3/uL (ref 4.0–10.5)
nRBC: 0 % (ref 0.0–0.2)

## 2018-09-13 MED ORDER — ONDANSETRON HCL 4 MG PO TABS: 4.0000 mg | ORAL_TABLET | Freq: Three times a day (TID) | ORAL | 1 refills | Status: DC | PRN

## 2018-09-13 MED ORDER — OXYCODONE HCL 5 MG/5ML PO SOLN: 5.0000 mg | Freq: Four times a day (QID) | ORAL | 0 refills | Status: DC | PRN

## 2018-09-13 NOTE — Plan of Care (Signed)
Nutrition Education Note  Received consult for diet education per DROP protocol.   Patient to discharge today. Diet was reviewed by bariatric coordinator. Pt with no questions for RD at this time. RD checked to see if clear liquid tray was on it's way per patient request. RD ordered tray.   Discussed 2 week post op diet with pt. Emphasized that liquids must be non carbonated, non caffeinated, and sugar free. Fluid goals discussed. Pt to follow up with outpatient bariatric RD for further diet progression after 2 weeks. Multivitamins and minerals also reviewed. Teach back method used, pt expressed understanding, expect good compliance.   Diet: First 2 Weeks  You will see the dietitian about two (2) weeks after your surgery. The dietitian will increase the types of foods you can eat if you are handling liquids well:  If you have severe vomiting or nausea and cannot handle clear liquids lasting longer than 1 day, call your surgeon  Protein Shake  Drink at least 2 ounces of shake 5-6 times per day  Each serving of protein shakes (usually 8 - 12 ounces) should have a minimum of:  15 grams of protein  And no more than 5 grams of carbohydrate  Goal for protein each day:  Men = 80 grams per day  Women = 60 grams per day  Protein powder may be added to fluids such as non-fat milk or Lactaid milk or Soy milk (limit to 35 grams added protein powder per serving)   Hydration  Slowly increase the amount of water and other clear liquids as tolerated (See Acceptable Fluids)  Slowly increase the amount of protein shake as tolerated  Sip fluids slowly and throughout the day  May use sugar substitutes in small amounts (no more than 6 - 8 packets per day; i.e. Splenda)   Fluid Goal  The first goal is to drink at least 8 ounces of protein shake/drink per day (or as directed by the nutritionist); some examples of protein shakes are Premier Protein, [employer], Dillard's, EAS Edge HP, and Unjury. See  handout from pre-op Bariatric Education Class:  Slowly increase the amount of protein shake you drink as tolerated  You may find it easier to slowly sip shakes throughout the day  It is important to get your proteins in first  Your fluid goal is to drink 64 - 100 ounces of fluid daily  It may take a few weeks to build up to this  32 oz (or more) should be clear liquids  And  32 oz (or more) should be full liquids (see below for examples)  Liquids should not contain sugar, caffeine, or carbonation   Clear Liquids:  Water or Sugar-free flavored water (i.e. Fruit H2O, Propel)  Decaffeinated coffee or tea (sugar-free)  Crystal Lite, Wyler's Lite, Minute Maid Lite  Sugar-free Jell-O  Bouillon or broth  Sugar-free Popsicle: *Less than 20 calories each; Limit 1 per day   Full Liquids:  Protein Shakes/Drinks + 2 choices per day of other full liquids  Full liquids must be:  No More Than 12 grams of Carbs per serving  No More Than 3 grams of Fat per serving  Strained low-fat cream soup  Non-Fat milk  Fat-free Lactaid Milk  Sugar-free yogurt (Dannon Lite & Fit, [place] yogurt, Oikos Zero)   Tilda Franco, MS, RD, LDN [employer] Inpatient Clinical Dietitian Pager: [phone] After Hours Pager: [phone]

## 2018-09-13 NOTE — Progress Notes (Signed)
Patient ID: Robyn Bryant, female   DOB: 1976-03-02, 43 y.o.   MRN: [id number] 1 Day Post-Op   Subjective: Some incisional pain and pulling with movement.  Okay at rest.  Has been up walking.  Tolerating fluids and protein shakes without nausea.  Objective: Vital signs in last 24 hours: Temp:  [97.3 F (36.3 C)-98.6 F (37 C)] 98.1 F (36.7 C) (02/05 0529) Pulse Rate:  [53-82] 67 (02/05 0529) Resp:  [14-20] 16 (02/05 0529) BP: (90-140)/(47-90) 103/63 (02/05 0529) SpO2:  [94 %-100 %] 97 % (02/05 0529)    Intake/Output from previous day: 02/04 0701 - 02/05 0700 In: 4058.4 [P.O.:840; I.V.:3018.4; IV Piggyback:200] Out: 1275 [Urine:1250; Blood:25] Intake/Output this shift: No intake/output data recorded.  General appearance: alert, cooperative and no distress Resp: clear to auscultation bilaterally GI: Minimal epigastric tenderness without guarding Incision/Wound: No erythema or drainage  Lab Results:  Recent Labs    09/12/18 1357 09/13/18 0502  WBC  --  7.9  HGB 11.8* 10.7*  HCT 37.9 34.5*  PLT  --  260   BMET No results for input(s): NA, K, CL, CO2, GLUCOSE, BUN, CREATININE, CALCIUM in the last 72 hours.   Studies/Results: No results found.  Anti-infectives: Anti-infectives (From admission, onward)   Start     Dose/Rate Route Frequency Ordered Stop   09/12/18 0600  ciprofloxacin (CIPRO) IVPB 400 mg     400 mg 200 mL/hr over 60 Minutes Intravenous On call to O.R. 09/12/18 0539 09/12/18 0845      Assessment/Plan: s/p Procedure(s): LAPAROSCOPIC ROUX-EN-Y GASTRIC BYPASS REVISION FROM GASTRIC SLEEVE WITH UPPER ENDOSCOPY, ERAS Pathway Doing very well postoperatively without apparent complication. Plan discharge later today if tolerating protein shakes well.   LOS: 1 day    Mariella Saa 09/13/2018

## 2018-09-13 NOTE — Progress Notes (Signed)
Patient alert and oriented, pain is controlled. Patient is tolerating fluids, advanced to protein shake today, patient is tolerating well. Reviewed Gastric Bypass discharge instructions with patient and patient is able to articulate understanding. Provided information on BELT program, Support Group and [facility] outpatient pharmacy. All questions answered, will continue to monitor.  Total fluid intake 1040 Per dehydration protocol call back one week post op

## 2018-09-13 NOTE — Progress Notes (Signed)
Patient has no questions regarding her discharge. NT will wheel patient out to the front of the building

## 2018-09-13 NOTE — Discharge Summary (Signed)
  Patient ID: Robyn Bryant [id number] 43 y.o. 1976/03/28  09/12/2018  Discharge date and time: 09/13/2018   Admitting Physician: Mariella Saa  Discharge Physician: Mariella Saa  Admission Diagnoses: H O Gastric Sleeve, GERD  Discharge Diagnoses: Same  Operations: Procedure(s): LAPAROSCOPIC ROUX-EN-Y GASTRIC BYPASS REVISION FROM GASTRIC SLEEVE WITH UPPER ENDOSCOPY, ERAS Pathway  Admission Condition: fair  Discharged Condition: fair  Indication for Admission: Patient is approximately 1 year status post laparoscopic sleeve gastrectomy for morbid obesity.  She has had excellent weight loss but has developed persistent and worsening gastroesophageal reflux disease.  No obstruction or hiatal hernia or anatomic problem seen on work-up.  With failure to control symptoms with maximal medical management who recommended proceeding with conversion to laparoscopic Roux-en-Y gastric bypass.  Hospital Course: Patient on the morning of admission underwent an uneventful laparoscopic conversion of her sleeve gastrectomy to gastric bypass.  Her postoperative course was uncomplicated.  The following morning she has some expected mild pain controlled with medications.  Tolerating fluids well.  Abdomen is benign.  Vital signs are within normal limits.  CBC is unremarkable.  She is felt ready for discharge.   Disposition: Home  Patient Instructions:  Allergies as of 09/13/2018      Reactions   Morphine And Related Itching, Other (See Comments)   Pt can tolerate with Benadryl.    Amoxicillin Itching   Latex Hives   Dilaudid [hydromorphone] Itching, Other (See Comments)   Pt can tolerate with Benadryl.    Gabapentin    Feel dizzy      Medication List    TAKE these medications   acetaminophen 325 MG tablet Commonly known as:  TYLENOL Take 650 mg by mouth every 6 (six) hours as needed.   EMGALITY 120 MG/ML Soaj Generic drug:  Galcanezumab-gnlm Inject 120 mg into the skin every 30  (thirty) days.   lamoTRIgine 25 MG tablet Commonly known as:  LAMICTAL Take 25 mg by mouth daily.   ondansetron 4 MG tablet Commonly known as:  ZOFRAN Take 1 tablet (4 mg total) by mouth every 8 (eight) hours as needed for nausea.   oxyCODONE 5 MG/5ML solution Commonly known as:  ROXICODONE Take 5 mLs (5 mg total) by mouth every 6 (six) hours as needed for moderate pain or severe pain.   pantoprazole 40 MG tablet Commonly known as:  PROTONIX Take 40 mg by mouth 2 (two) times daily.       Activity: activity as tolerated Diet: Bariatric protein shakes Wound Care: none needed  Follow-up:  With Jaikob Borgwardt in 3 weeks.  Signed: Mariella Saa MD, FACS  09/13/2018, 7:48 AM

## 2018-09-14 ENCOUNTER — Encounter: Payer: Self-pay | Admitting: Internal Medicine

## 2018-09-18 ENCOUNTER — Telehealth (HOSPITAL_COMMUNITY): Payer: Self-pay

## 2018-09-18 NOTE — Telephone Encounter (Signed)
Patient called to discuss post bariatric surgery follow up questions.  See below:   1.  Tell me about your pain and pain management?no problem  2.  Let's talk about fluid intake.  How much total fluid are you taking in?44 ounces  3.  How much protein have you taken in the last 2 days?40 grams of protein  4.  Have you had nausea?  Tell me about when have experienced nausea and what you did to help? No nausea or acid reflux   5.  Has the frequency or color changed with your urine?urinating regularly clear in color, no dizziness or headache   6.  Tell me what your incisions look like?itchy but no problem  7.  Have you been passing gas? BM?had bm no problem  8.  If a problem or question were to arise who would you call?  Do you know contact numbers for [facility], [facility], and [facility]?aware of how to contact all services  9.  How has the walking going?wallking every hour   10.  How are your vitamins and calcium going?  How are you taking them?No problems with vitamins taking TUMS for calcium

## 2018-09-26 ENCOUNTER — Encounter: Payer: BC Managed Care – PPO | Attending: General Surgery | Admitting: Skilled Nursing Facility1

## 2018-09-26 DIAGNOSIS — E669 Obesity, unspecified: Secondary | ICD-10-CM

## 2018-09-26 DIAGNOSIS — K219 Gastro-esophageal reflux disease without esophagitis: Secondary | ICD-10-CM | POA: Diagnosis not present

## 2018-09-26 NOTE — Progress Notes (Signed)
Bariatric Class:  Appt start time: 1530 end time:  1630.  2 Week Post-Operative Nutrition Class  Patient was seen on 09/26/2018 for Post-Operative Nutrition education at the [facility].   Pt states she regrets getting surgery due to rashes around her incisions and wants to eat more than the guidelines say she should be eating (amount and type of food). Pt states she does not want to lose anymore weight. Pt states she is sleeping perfectly. Pt states she has eaten pudding and steak and oatmeal.   Supplements: celebrate multi and calcium  Surgery date: 09/12/2018 Surgery type: Sleeve to RYGB Start weight at [facility]: 255 Weight today: 194.2   TANITA  BODY COMP RESULTS  09/26/2018   BMI (kg/m^2) 32.8   Fat Mass (lbs) 92.2   Fat Free Mass (lbs) 102   Total Body Water (lbs) 73.4   Woke 7:30am Blended Oatmeal (8 ounces) with cinnamon and 1% milk Drinking flavored water  4 hours later strawberry yogurt carb master 2 cauliflower patties Protein clear water (20g)  Blended Oatmeal with 1% milk and cinnamon  Pudding with protein powder added   72 ounces of water; 60 g protein   The following the learning objectives were met by the patient during this course:  Identifies Phase 3A (Soft, High Proteins) Dietary Goals and will begin from 2 weeks post-operatively to 2 months post-operatively  Identifies appropriate sources of fluids and proteins   States protein recommendations and appropriate sources post-operatively  Identifies the need for appropriate texture modifications, mastication, and bite sizes when consuming solids  Identifies appropriate multivitamin and calcium sources post-operatively  Describes the need for physical activity post-operatively and will follow MD recommendations  States when to call healthcare provider regarding medication questions or post-operative complications  Handouts given during class include:  Phase 3A: Soft, High  Protein Diet Handout  Follow-Up Plan: Patient will follow-up at [facility] in 6 weeks for 2 month post-op nutrition visit for diet advancement per MD.

## 2018-10-09 ENCOUNTER — Other Ambulatory Visit: Payer: Self-pay | Admitting: Internal Medicine

## 2018-10-18 ENCOUNTER — Encounter: Payer: Self-pay | Admitting: Internal Medicine

## 2018-10-18 ENCOUNTER — Other Ambulatory Visit: Payer: Self-pay

## 2018-10-18 ENCOUNTER — Other Ambulatory Visit: Payer: BC Managed Care – PPO

## 2018-10-18 ENCOUNTER — Ambulatory Visit (INDEPENDENT_AMBULATORY_CARE_PROVIDER_SITE_OTHER): Payer: BC Managed Care – PPO | Admitting: Internal Medicine

## 2018-10-18 VITALS — BP 104/80 | HR 66 | Temp 98.5°F | Resp 16 | Ht 64.0 in | Wt 194.2 lb

## 2018-10-18 DIAGNOSIS — E519 Thiamine deficiency, unspecified: Secondary | ICD-10-CM | POA: Diagnosis not present

## 2018-10-18 DIAGNOSIS — Z0000 Encounter for general adult medical examination without abnormal findings: Secondary | ICD-10-CM

## 2018-10-18 DIAGNOSIS — E538 Deficiency of other specified B group vitamins: Secondary | ICD-10-CM

## 2018-10-18 DIAGNOSIS — Z9884 Bariatric surgery status: Secondary | ICD-10-CM

## 2018-10-18 DIAGNOSIS — E559 Vitamin D deficiency, unspecified: Secondary | ICD-10-CM

## 2018-10-18 DIAGNOSIS — D539 Nutritional anemia, unspecified: Secondary | ICD-10-CM

## 2018-10-18 MED ORDER — CYANOCOBALAMIN 1000 MCG/ML IJ SOLN: 1000.0000 ug | Freq: Once | INTRAMUSCULAR | 5 refills | Status: DC

## 2018-10-18 MED ORDER — "SYRINGE/NEEDLE (DISP) 25G X 1"" 3 ML MISC": 6 refills | Status: DC

## 2018-10-18 MED ORDER — CYANOCOBALAMIN 1000 MCG/ML IJ SOLN
1000.0000 ug | Freq: Once | INTRAMUSCULAR | Status: AC
  Administered 2018-10-18: 1000 ug via INTRAMUSCULAR

## 2018-10-18 NOTE — Progress Notes (Signed)
Subjective:  Patient ID: Robyn Bryant, female    DOB: [birth year]  Age: 43 y.o. MRN: [id number]  CC: Annual Exam and Anemia   HPI Krystiana Fornes presents for a CPX.  She recently underwent Roux-en-Y bariatric surgery.  She was found to be anemic.  She complains of recent episodes of orthostatic dizziness.  She has not recently been receiving a B12 supplement.  Outpatient Medications Prior to Visit  Medication Sig Dispense Refill  . Galcanezumab-gnlm (EMGALITY) 120 MG/ML SOAJ Inject 120 mg into the skin every 30 (thirty) days.    . pantoprazole (PROTONIX) 40 MG tablet Take 40 mg by mouth 2 (two) times daily.     [name] acetaminophen (TYLENOL) 325 MG tablet Take 650 mg by mouth every 6 (six) hours as needed.    . lamoTRIgine (LAMICTAL) 25 MG tablet Take 25 mg by mouth daily.    . ondansetron (ZOFRAN) 4 MG tablet Take 1 tablet (4 mg total) by mouth every 8 (eight) hours as needed for nausea. 8 tablet 1  . oxyCODONE (ROXICODONE) 5 MG/5ML solution Take 5 mLs (5 mg total) by mouth every 6 (six) hours as needed for moderate pain or severe pain. 100 mL 0   No facility-administered medications prior to visit.     ROS Review of Systems  Constitutional: Negative for diaphoresis, fatigue and unexpected weight change.  HENT: Negative.   Eyes: Negative.   Respiratory: Negative.  Negative for cough, chest tightness, shortness of breath and wheezing.   Cardiovascular: Negative for chest pain, palpitations and leg swelling.  Gastrointestinal: Negative for abdominal pain, constipation, diarrhea, nausea and vomiting.  Endocrine: Negative.   Genitourinary: Negative.  Negative for difficulty urinating, dysuria and hematuria.  Musculoskeletal: Negative.  Negative for arthralgias and myalgias.  Skin: Negative.  Negative for pallor.  Neurological: Positive for dizziness. Negative for weakness and light-headedness.  Hematological: Negative for adenopathy. Does not bruise/bleed easily.   Psychiatric/Behavioral: Negative.     Objective:  BP 104/80 (BP Location: Left Arm, Patient Position: Sitting, Cuff Size: Large)   Pulse 66   Temp 98.5 F (36.9 C) (Oral)   Resp 16   Ht  (1.626 m)   Wt 194 lb 4 oz (88.1 kg)   SpO2 93%   BMI 33.34 kg/m   BP Readings from Last 3 Encounters:  10/18/18 104/80  09/13/18 119/69  09/07/18 115/60    Wt Readings from Last 3 Encounters:  10/18/18 194 lb 4 oz (88.1 kg)  09/26/18 194 lb 3.2 oz (88.1 kg)  09/12/18 202 lb 6 oz (91.8 kg)    Physical Exam Vitals signs reviewed.  Constitutional:      Appearance: She is not ill-appearing or diaphoretic.  HENT:     Nose: Nose normal. No congestion or rhinorrhea.     Mouth/Throat:     Mouth: Mucous membranes are moist. Mucous membranes are pale.     Pharynx: No oropharyngeal exudate or posterior oropharyngeal erythema.  Eyes:     General: No scleral icterus.    Conjunctiva/sclera: Conjunctivae normal.  Neck:     Musculoskeletal: Normal range of motion and neck supple. No muscular tenderness.  Cardiovascular:     Rate and Rhythm: Normal rate and regular rhythm.     Heart sounds: No murmur. No gallop.   Pulmonary:     Breath sounds: No stridor. No wheezing, rhonchi or rales.  Abdominal:     General: Bowel sounds are normal.     Palpations: There is no hepatomegaly, splenomegaly or  mass.     Tenderness: There is no abdominal tenderness. There is no guarding.  Musculoskeletal: Normal range of motion.        General: No swelling.     Right lower leg: No edema.     Left lower leg: No edema.  Lymphadenopathy:     Cervical: No cervical adenopathy.  Skin:    General: Skin is warm and dry.     Coloration: Skin is pale. Skin is not jaundiced.  Neurological:     General: No focal deficit present.     Mental Status: She is oriented to person, place, and time. Mental status is at baseline.  Psychiatric:        Mood and Affect: Mood normal.        Behavior: Behavior normal.      Lab Results  Component Value Date   WBC 7.9 09/13/2018   HGB 10.7 (L) 09/13/2018   HCT 34.5 (L) 09/13/2018   PLT 260 09/13/2018   GLUCOSE 106 (H) 11/04/2017   CHOL 102 10/20/2018   TRIG 109.0 10/20/2018   HDL 42.50 10/20/2018   LDLCALC 38 10/20/2018   ALT 12 11/04/2017   AST 11 11/04/2017   NA 138 11/04/2017   K 3.5 11/04/2017   CL 105 11/04/2017   CREATININE 0.77 11/04/2017   BUN 10 11/04/2017   CO2 24 11/04/2017   TSH 1.65 08/23/2016   HGBA1C 5.2 02/03/2015    Dg Chest 2 View  Result Date: 09/08/2018 CLINICAL DATA:  Preop gastric bypass EXAM: CHEST - 2 VIEW COMPARISON:  06/06/2013 FINDINGS: The heart size and mediastinal contours are within normal limits. Both lungs are clear. Cholecystectomy clips are seen in the right upper quadrant. The visualized skeletal structures are unremarkable. IMPRESSION: No active cardiopulmonary disease. Electronically Signed   By: Tollie Eth M.D.   On: 09/08/2018 03:19    Assessment & Plan:   Yeymi was seen today for annual exam and anemia.  Diagnoses and all orders for this visit:  B12 deficiency- She agrees to restart monthly parenteral B12 replacement therapy. -     cyanocobalamin ((VITAMIN B-12)) injection 1,000 mcg  Gastric bypass status for obesity- I will monitor her for vitamin deficiencies. -     Vitamin B1; Future -     Zinc; Future -     IBC panel; Future -     Ferritin; Future -     VITAMIN D 25 Hydroxy (Vit-D Deficiency, Fractures); Future  Thiamine deficiency  Vitamin D deficiency -     VITAMIN D 25 Hydroxy (Vit-D Deficiency, Fractures); Future -     Cholecalciferol 50 MCG (2000 UT) TABS; Take 2 tablets (4,000 Units total) by mouth daily.  Deficiency anemia- Will treat the B12 deficiency.  I will also check for other vitamin deficiencies. -     Vitamin B1; Future -     Zinc; Future -     IBC panel; Future -     Ferritin; Future  Routine general medical examination at a health care facility- Exam completed, labs  reviewed, vaccines reviewed, mammogram is up-to-date, Pap is up-to-date, patient education material was given. -     Lipid panel; Future  Other orders -     cyanocobalamin (,VITAMIN B-12,) 1000 MCG/ML injection; Inject 1 mL (1,000 mcg total) into the muscle once for 1 dose. -     SYRINGE-NEEDLE, DISP, 3 ML 25G X 1" 3 ML MISC; Use in inject b12 monthly   I have discontinued [place]  lamoTRIgine, acetaminophen, oxyCODONE, and ondansetron. I am also having her start on cyanocobalamin, SYRINGE-NEEDLE (DISP) 3 ML, and Cholecalciferol. Additionally, I am having her maintain her pantoprazole and Galcanezumab-gnlm. We administered cyanocobalamin.  Meds ordered this encounter  Medications  . cyanocobalamin ((VITAMIN B-12)) injection 1,000 mcg  . cyanocobalamin (,VITAMIN B-12,) 1000 MCG/ML injection    Sig: Inject 1 mL (1,000 mcg total) into the muscle once for 1 dose.    Dispense:  1 mL    Refill:  5  . SYRINGE-NEEDLE, DISP, 3 ML 25G X 1" 3 ML MISC    Sig: Use in inject b12 monthly    Dispense:  1 each    Refill:  6    May substitute syringe needle with what is available.  . Cholecalciferol 50 MCG (2000 UT) TABS    Sig: Take 2 tablets (4,000 Units total) by mouth daily.    Dispense:  180 tablet    Refill:  0     Follow-up: Return in about 3 months (around 01/18/2019).  Sanda Linger, MD

## 2018-10-18 NOTE — Patient Instructions (Signed)
Anemia  Anemia is a condition in which you do not have enough red blood cells or hemoglobin. Hemoglobin is a substance in red blood cells that carries oxygen. When you do not have enough red blood cells or hemoglobin (are anemic), your body cannot get enough oxygen and your organs may not work properly. As a result, you may feel very tired or have other problems. What are the causes? Common causes of anemia include:  Excessive bleeding. Anemia can be caused by excessive bleeding inside or outside the body, including bleeding from the intestine or from periods in women.  Poor nutrition.  Long-lasting (chronic) kidney, thyroid, and liver disease.  Bone marrow disorders.  Cancer and treatments for cancer.  HIV (human immunodeficiency virus) and AIDS (acquired immunodeficiency syndrome).  Treatments for HIV and AIDS.  Spleen problems.  Blood disorders.  Infections, medicines, and autoimmune disorders that destroy red blood cells. What are the signs or symptoms? Symptoms of this condition include:  Minor weakness.  Dizziness.  Headache.  Feeling heartbeats that are irregular or faster than normal (palpitations).  Shortness of breath, especially with exercise.  Paleness.  Cold sensitivity.  Indigestion.  Nausea.  Difficulty sleeping.  Difficulty concentrating. Symptoms may occur suddenly or develop slowly. If your anemia is mild, you may not have symptoms. How is this diagnosed? This condition is diagnosed based on:  Blood tests.  Your medical history.  A physical exam.  Bone marrow biopsy. Your health care provider may also check your stool (feces) for blood and may do additional testing to look for the cause of your bleeding. You may also have other tests, including:  Imaging tests, such as a CT scan or MRI.  Endoscopy.  Colonoscopy. How is this treated? Treatment for this condition depends on the cause. If you continue to lose a lot of blood, you may  need to be treated at a hospital. Treatment may include:  Taking supplements of iron, vitamin S31, or folic acid.  Taking a hormone medicine (erythropoietin) that can help to stimulate red blood cell growth.  Having a blood transfusion. This may be needed if you lose a lot of blood.  Making changes to your diet.  Having surgery to remove your spleen. Follow these instructions at home:  Take over-the-counter and prescription medicines only as told by your health care provider.  Take supplements only as told by your health care provider.  Follow any diet instructions that you were given.  Keep all follow-up visits as told by your health care provider. This is important. Contact a health care provider if:  You develop new bleeding anywhere in the body. Get help right away if:  You are very weak.  You are short of breath.  You have pain in your abdomen or chest.  You are dizzy or feel faint.  You have trouble concentrating.  You have bloody or black, tarry stools.  You vomit repeatedly or you vomit up blood. Summary  Anemia is a condition in which you do not have enough red blood cells or enough of a substance in your red blood cells that carries oxygen (hemoglobin).  Symptoms may occur suddenly or develop slowly.  If your anemia is mild, you may not have symptoms.  This condition is diagnosed with blood tests as well as a medical history and physical exam. Other tests may be needed.  Treatment for this condition depends on the cause of the anemia. This information is not intended to replace advice given to you by  your health care provider. Make sure you discuss any questions you have with your health care provider. Document Released: 09/02/2004 Document Revised: 08/27/2016 Document Reviewed: 08/27/2016 Elsevier Interactive Patient Education  2019 [employer].

## 2018-10-19 ENCOUNTER — Encounter: Payer: Self-pay | Admitting: Internal Medicine

## 2018-10-20 ENCOUNTER — Other Ambulatory Visit (INDEPENDENT_AMBULATORY_CARE_PROVIDER_SITE_OTHER): Payer: BC Managed Care – PPO

## 2018-10-20 DIAGNOSIS — Z0000 Encounter for general adult medical examination without abnormal findings: Secondary | ICD-10-CM

## 2018-10-20 DIAGNOSIS — D539 Nutritional anemia, unspecified: Secondary | ICD-10-CM | POA: Diagnosis not present

## 2018-10-20 DIAGNOSIS — E559 Vitamin D deficiency, unspecified: Secondary | ICD-10-CM | POA: Diagnosis not present

## 2018-10-20 DIAGNOSIS — Z9884 Bariatric surgery status: Secondary | ICD-10-CM

## 2018-10-20 LAB — LIPID PANEL
Cholesterol: 102 mg/dL (ref 0–200)
HDL: 42.5 mg/dL (ref >39.00)
LDL Cholesterol: 38 mg/dL (ref 0–99)
NonHDL: 59.89
Total CHOL/HDL Ratio: 2
Triglycerides: 109 mg/dL (ref 0.0–149.0)
VLDL: 21.8 mg/dL (ref 0.0–40.0)

## 2018-10-20 LAB — VITAMIN D 25 HYDROXY (VIT D DEFICIENCY, FRACTURES): VITD: 28.31 ng/mL — ABNORMAL LOW (ref 30.00–100.00)

## 2018-10-20 LAB — IBC PANEL
Iron: 66 ug/dL (ref 42–145)
Saturation Ratios: 19.1 % — ABNORMAL LOW (ref 20.0–50.0)
Transferrin: 247 mg/dL (ref 212.0–360.0)

## 2018-10-20 LAB — FERRITIN: Ferritin: 23.1 ng/mL (ref 10.0–291.0)

## 2018-10-21 ENCOUNTER — Encounter: Payer: Self-pay | Admitting: Internal Medicine

## 2018-10-21 MED ORDER — CHOLECALCIFEROL 50 MCG (2000 UT) PO TABS: 2.0000 | ORAL_TABLET | Freq: Every day | ORAL | 0 refills | Status: DC

## 2018-10-22 LAB — VITAMIN B1: Vitamin B1 (Thiamine): 25 nmol/L (ref 8–30)

## 2018-10-22 LAB — ZINC: Zinc: 91 ug/dL (ref 60–130)

## 2018-10-23 ENCOUNTER — Ambulatory Visit: Payer: BC Managed Care – PPO | Admitting: Skilled Nursing Facility1

## 2018-10-24 ENCOUNTER — Encounter: Payer: BC Managed Care – PPO | Attending: General Surgery | Admitting: Skilled Nursing Facility1

## 2018-10-24 ENCOUNTER — Other Ambulatory Visit: Payer: Self-pay

## 2018-10-24 ENCOUNTER — Encounter: Payer: Self-pay | Admitting: Internal Medicine

## 2018-10-24 DIAGNOSIS — K219 Gastro-esophageal reflux disease without esophagitis: Secondary | ICD-10-CM | POA: Diagnosis present

## 2018-10-24 DIAGNOSIS — E669 Obesity, unspecified: Secondary | ICD-10-CM

## 2018-10-24 NOTE — Patient Instructions (Addendum)
-  Always meet the 60 grams of protein a day  -Always have at minium of 50 grams of complex carbohydrates a day (brown rice, fruit, Quinoa, potatoes sweet and white, corn, peas)  -Avoid juice, soda, fried foods, other surgery beverages  -Limit Desserts, packaged dessert cakes, chocolate   -Physical activity: Aim for a minimum of 30 minutes 5-6 days a week getting sweaty and out of breath  -Aim for a minimum of 64 fluid ounces a day with at least half being plain water   -Continue to take the appropriate multivitamin and calcium daily

## 2018-10-24 NOTE — Progress Notes (Signed)
Follow-up visit:  8 Weeks Post-Operative Sleeve to RYGB Surgery Primary concerns today: Post-operative Bariatric Surgery Nutrition Management.  Pt states she has struggled with low vitamin D since her first sleeve surgery.  Pt states she cannot do any liquids warm because they cause sharp pains in her stomach and cannot do plain water because it has no taste. Pt reports that she drinks water with flavoring added to it. Pt states the smell of popcorn makes her physically sick with diarrhea. Pt states that she is able to eat 2-3 spoonfuls of ice cream without consequence. Pt states that she recently went to her PCP and was found to have iron deficiency anemia. Pt states she recently tried eating broiled fish and became sick; thought maybe it was due to it being too dry.Pt reports that she is happy with her current wt and would not want to drop below 165lbs. Pt reports she wishes to continue to maintain her current wt.   Vitamin D: 28.31 HGB: 10.7 HCT: 34.5  Surgery date: 09/12/2018 Surgery type: Sleeve to RYGB Start weight at [facility]: 255 Weight today: 188.3 Weight change: 6 pounds    Body Composition Scale  Total Body Fat: 38.2 %  Visceral Fat: 10  Fat-Free Mass: 61.7  %   Total Body Water:  45.3 %  Muscle-Mass: 30.5 lbs  Body Fat Displacement:  Torso:  44.4 lbs Left Leg:  8.8lbs Right Leg:  8.8lbs Left Arm:  4.4lbs Right Arm: 4.4 lbs   24-hr recall: 1 pack Blended Oatmeal (8 ounces) and 1% milk (1.5 hours to finish) or premier protein shake  cheese Cheese and nuts or lunch meat and cheese Deli meat  6 in philly cheesesteak sub (taking 2 hours)   Fluid intake: 50-65 ounces of water; appeljuice Estimated total protein intake: 60 g protein   Medications: see list Supplementation: celebrate, calcium, iron  Using straws: n/a Drinking while eating: no Having you been chewing well:n/a Chewing/swallowing difficulties: no Changes in vision: no Changes to mood/headaches:  no Hair loss/Cahnges to skin/Changes to nails: n/a Any difficulty focusing or concentrating: n/a Sweating: n/a Dizziness/Lightheaded:  n/a Palpitations: n/a Carbonated beverages: no N/V/D/C/GAS: 2 episodes of diarrhea since surgery Abdominal Pain: no  Recent physical activity:  Walking, ADL's  Progress Towards Goal(s):  In progress.  Nutritional Diagnosis:  [place]-1.4 Altered GI function As related to previous sleeve surgery and conversion from sleeve to RYGB.  As evidenced by chronic vitamin D deficiency and current iron deficiency anemia.    Intervention:  Nutrition Counseling. Pts diet was advanced full maintenance.   Due to the bodies need for essential vitamins, minerals, and fats the pt was educated on the need to consume a certain amount of calories as well as certain nutrients daily. Pt was educated on the need for daily physical activity and to reach a goal of at least 150 minutes of moderate to vigorous physical activity as directed by their physician due to such benefits as increased musculature and improved lab values.    Goals: -Always meet the 60 grams of protein a day  -Always have at minium of 50 grams of complex carbohydrates a day (brown rice, fruit, Quinoa, potatoes sweet and white, corn, peas)  -Avoid juice, soda, fried foods, other surgery beverages  -Limit Desserts, packaged dessert cakes, chocolate   -Physical activity: Aim for a minimum of 30 minutes 5-6 days a week getting sweaty and out of breath  -Aim for a minimum of 64 fluid ounces a day with at least  half being plain water   -Continue to take the appropriate multivitamin and calcium daily  Teaching Method Utilized:  Visual Auditory Hands on  Barriers to learning/adherence to lifestyle change: motivation  Demonstrated degree of understanding via:  Teach Back   Monitoring/Evaluation:  Dietary intake, exercise, and body weight.

## 2018-11-06 ENCOUNTER — Encounter: Payer: Self-pay | Admitting: Internal Medicine

## 2018-12-15 ENCOUNTER — Encounter: Payer: Self-pay | Admitting: Internal Medicine

## 2018-12-28 ENCOUNTER — Encounter: Payer: Self-pay | Admitting: Internal Medicine

## 2019-01-03 ENCOUNTER — Ambulatory Visit: Payer: BC Managed Care – PPO | Admitting: Internal Medicine

## 2019-01-04 ENCOUNTER — Ambulatory Visit: Payer: BC Managed Care – PPO | Admitting: Internal Medicine

## 2019-01-04 ENCOUNTER — Encounter: Payer: Self-pay | Admitting: Internal Medicine

## 2019-01-18 ENCOUNTER — Other Ambulatory Visit: Payer: Self-pay | Admitting: Internal Medicine

## 2019-01-18 DIAGNOSIS — E559 Vitamin D deficiency, unspecified: Secondary | ICD-10-CM

## 2019-04-22 ENCOUNTER — Encounter: Payer: Self-pay | Admitting: Internal Medicine

## 2019-04-25 ENCOUNTER — Ambulatory Visit (INDEPENDENT_AMBULATORY_CARE_PROVIDER_SITE_OTHER): Payer: BC Managed Care – PPO

## 2019-04-25 DIAGNOSIS — Z23 Encounter for immunization: Secondary | ICD-10-CM

## 2019-05-21 DIAGNOSIS — R42 Dizziness and giddiness: Secondary | ICD-10-CM | POA: Insufficient documentation

## 2019-05-22 ENCOUNTER — Other Ambulatory Visit: Payer: Self-pay

## 2019-05-22 DIAGNOSIS — Z20822 Contact with and (suspected) exposure to covid-19: Secondary | ICD-10-CM

## 2019-05-24 LAB — NOVEL CORONAVIRUS, NAA: SARS-CoV-2, NAA: NOT DETECTED

## 2019-05-28 ENCOUNTER — Encounter: Payer: Self-pay | Admitting: Internal Medicine

## 2019-05-31 ENCOUNTER — Ambulatory Visit (INDEPENDENT_AMBULATORY_CARE_PROVIDER_SITE_OTHER): Payer: BC Managed Care – PPO | Admitting: Internal Medicine

## 2019-05-31 ENCOUNTER — Other Ambulatory Visit (INDEPENDENT_AMBULATORY_CARE_PROVIDER_SITE_OTHER): Payer: BC Managed Care – PPO

## 2019-05-31 ENCOUNTER — Encounter: Payer: Self-pay | Admitting: Internal Medicine

## 2019-05-31 DIAGNOSIS — R42 Dizziness and giddiness: Secondary | ICD-10-CM

## 2019-05-31 DIAGNOSIS — Z9884 Bariatric surgery status: Secondary | ICD-10-CM

## 2019-05-31 LAB — COMPREHENSIVE METABOLIC PANEL
ALT: 21 U/L (ref 0–35)
AST: 20 U/L (ref 0–37)
Albumin: 4 g/dL (ref 3.5–5.2)
Alkaline Phosphatase: 72 U/L (ref 39–117)
BUN: 10 mg/dL (ref 6–23)
CO2: 25 mEq/L (ref 19–32)
Calcium: 9 mg/dL (ref 8.4–10.5)
Chloride: 105 mEq/L (ref 96–112)
Creatinine, Ser: 0.71 mg/dL (ref 0.40–1.20)
GFR: 108.82 mL/min (ref >60.00)
Glucose, Bld: 118 mg/dL — ABNORMAL HIGH (ref 70–99)
Potassium: 4.1 mEq/L (ref 3.5–5.1)
Sodium: 138 mEq/L (ref 135–145)
Total Bilirubin: 0.9 mg/dL (ref 0.2–1.2)
Total Protein: 6.8 g/dL (ref 6.0–8.3)

## 2019-05-31 LAB — VITAMIN B12: Vitamin B-12: 297 pg/mL (ref 211–911)

## 2019-05-31 LAB — FERRITIN: Ferritin: 19.3 ng/mL (ref 10.0–291.0)

## 2019-05-31 LAB — CBC
HCT: 38.3 % (ref 36.0–46.0)
Hemoglobin: 12.6 g/dL (ref 12.0–15.0)
MCHC: 32.8 g/dL (ref 30.0–36.0)
MCV: 84.4 fl (ref 78.0–100.0)
Platelets: 307 10*3/uL (ref 150.0–400.0)
RBC: 4.54 Mil/uL (ref 3.87–5.11)
RDW: 14.4 % (ref 11.5–15.5)
WBC: 7.4 10*3/uL (ref 4.0–10.5)

## 2019-05-31 LAB — TSH: TSH: 1.53 u[IU]/mL (ref 0.35–4.50)

## 2019-05-31 LAB — T4, FREE: Free T4: 0.73 ng/dL (ref 0.60–1.60)

## 2019-05-31 LAB — HEMOGLOBIN A1C: Hgb A1c MFr Bld: 5.2 % (ref 4.6–6.5)

## 2019-05-31 LAB — VITAMIN D 25 HYDROXY (VIT D DEFICIENCY, FRACTURES): VITD: 26.52 ng/mL — ABNORMAL LOW (ref 30.00–100.00)

## 2019-05-31 NOTE — Progress Notes (Signed)
Virtual Visit via Video Note  I connected with Robyn Bryant on 05/31/19 at  3:20 PM EDT by a video enabled telemedicine application and verified that I am speaking with the correct person using two identifiers.  The patient and the provider were at separate locations throughout the entire encounter.   I discussed the limitations of evaluation and management by telemedicine and the availability of in person appointments. The patient expressed understanding and agreed to proceed. The patient and the provider were the only parties present for the visit unless noted in HPI below.  History of Present Illness: The patient is a 43 y.o. female with visit for lightheadedness with standing. Started about 1-2 weeks ago. Has not changed diet, exercise or medications. S/P gastric bypass in February and is taking multivitamin daily. She takes emgality for migraines and those are about the same lately. Denies fevers or chills. Denies cough or SOB. Having some lightheadedness with standing and about 1 month ago got so lightheaded in the shower she passed out. Did not seek care at the time and denies injury from that. She is concerned as in the last week or so every time she stands up she is getting lightheaded. Overall it is stable but bad. Has tried nothing as she did not know what to try. Her weight is down to 140 from over 200 in the last 6 months intentionally with the gastric bypass at about 1-2 pounds per week average. Denies blood in stool or heavy periods.She was exposed to known positive for covid-19 on 05/21/19 and was tested the next day and was negative. She currently denies symptoms of covid-19.   Observations/Objective: Appearance: normal, sitting, breathing appears normal, casual grooming, abdomen does not appear distended, throat normal, memory normal, mental status is A and O times 3  Assessment and Plan: See problem oriented charting  Follow Up Instructions: labs then assess  Visit time 25  minutes: greater than 50% of that time was spent in face to face counseling and coordination of care with the patient: counseled about the serious nature of her concerns and possible etiologies as well as the timeline with her symptoms and weight loss  I discussed the assessment and treatment plan with the patient. The patient was provided an opportunity to ask questions and all were answered. The patient agreed with the plan and demonstrated an understanding of the instructions.   The patient was advised to call back or seek an in-person evaluation if the symptoms worsen or if the condition fails to improve as anticipated.  Hoyt Koch, MD

## 2019-06-01 ENCOUNTER — Other Ambulatory Visit: Payer: Self-pay

## 2019-06-01 DIAGNOSIS — R42 Dizziness and giddiness: Secondary | ICD-10-CM | POA: Insufficient documentation

## 2019-06-01 DIAGNOSIS — Z20822 Contact with and (suspected) exposure to covid-19: Secondary | ICD-10-CM

## 2019-06-01 NOTE — Assessment & Plan Note (Signed)
Checking labs to rule out anemia or vitamin deficiency due to gastric bypass. It is possible she is having low BP with her significant weight loss. If no etiology on labs will likely need face to face visit with vitals, EKG to assess etiology.

## 2019-06-03 LAB — NOVEL CORONAVIRUS, NAA: SARS-CoV-2, NAA: NOT DETECTED

## 2019-06-17 ENCOUNTER — Other Ambulatory Visit: Payer: Self-pay | Admitting: Internal Medicine

## 2019-06-17 ENCOUNTER — Encounter: Payer: Self-pay | Admitting: Internal Medicine

## 2019-06-18 MED ORDER — CYANOCOBALAMIN 1000 MCG/ML IJ SOLN: 1000.0000 ug | Freq: Once | INTRAMUSCULAR | 5 refills | Status: AC

## 2019-07-03 ENCOUNTER — Other Ambulatory Visit: Payer: Self-pay

## 2019-07-03 DIAGNOSIS — Z20822 Contact with and (suspected) exposure to covid-19: Secondary | ICD-10-CM

## 2019-07-05 LAB — NOVEL CORONAVIRUS, NAA: SARS-CoV-2, NAA: DETECTED — AB

## 2019-07-16 ENCOUNTER — Encounter: Payer: Self-pay | Admitting: Internal Medicine

## 2019-08-17 ENCOUNTER — Other Ambulatory Visit: Payer: Self-pay | Admitting: Internal Medicine

## 2019-10-16 ENCOUNTER — Encounter: Payer: BC Managed Care – PPO | Admitting: Internal Medicine

## 2019-10-16 ENCOUNTER — Encounter: Payer: Self-pay | Admitting: Internal Medicine

## 2019-10-25 ENCOUNTER — Encounter: Payer: Self-pay | Admitting: Internal Medicine

## 2019-11-13 ENCOUNTER — Encounter: Payer: Self-pay | Admitting: Internal Medicine

## 2019-11-19 ENCOUNTER — Other Ambulatory Visit: Payer: Self-pay

## 2019-11-19 ENCOUNTER — Encounter: Payer: Self-pay | Admitting: Internal Medicine

## 2019-11-19 ENCOUNTER — Ambulatory Visit (INDEPENDENT_AMBULATORY_CARE_PROVIDER_SITE_OTHER): Payer: 59 | Admitting: Internal Medicine

## 2019-11-19 VITALS — BP 120/80 | HR 73 | Temp 98.2°F | Resp 16 | Ht 64.0 in | Wt 140.2 lb

## 2019-11-19 DIAGNOSIS — Z9884 Bariatric surgery status: Secondary | ICD-10-CM | POA: Diagnosis not present

## 2019-11-19 DIAGNOSIS — E559 Vitamin D deficiency, unspecified: Secondary | ICD-10-CM

## 2019-11-19 DIAGNOSIS — E538 Deficiency of other specified B group vitamins: Secondary | ICD-10-CM | POA: Diagnosis not present

## 2019-11-19 DIAGNOSIS — Z0000 Encounter for general adult medical examination without abnormal findings: Secondary | ICD-10-CM | POA: Diagnosis not present

## 2019-11-19 DIAGNOSIS — D539 Nutritional anemia, unspecified: Secondary | ICD-10-CM

## 2019-11-19 DIAGNOSIS — Z124 Encounter for screening for malignant neoplasm of cervix: Secondary | ICD-10-CM | POA: Insufficient documentation

## 2019-11-19 DIAGNOSIS — E519 Thiamine deficiency, unspecified: Secondary | ICD-10-CM | POA: Diagnosis not present

## 2019-11-19 DIAGNOSIS — Z1231 Encounter for screening mammogram for malignant neoplasm of breast: Secondary | ICD-10-CM

## 2019-11-19 DIAGNOSIS — R7989 Other specified abnormal findings of blood chemistry: Secondary | ICD-10-CM

## 2019-11-19 LAB — CBC WITH DIFFERENTIAL/PLATELET
Basophils Absolute: 0.1 10*3/uL (ref 0.0–0.1)
Basophils Relative: 1.2 % (ref 0.0–3.0)
Eosinophils Absolute: 0.2 10*3/uL (ref 0.0–0.7)
Eosinophils Relative: 3 % (ref 0.0–5.0)
HCT: 34.2 % — ABNORMAL LOW (ref 36.0–46.0)
Hemoglobin: 11.6 g/dL — ABNORMAL LOW (ref 12.0–15.0)
Lymphocytes Relative: 39.8 % (ref 12.0–46.0)
Lymphs Abs: 2.4 10*3/uL (ref 0.7–4.0)
MCHC: 34 g/dL (ref 30.0–36.0)
MCV: 82.1 fl (ref 78.0–100.0)
Monocytes Absolute: 0.5 10*3/uL (ref 0.1–1.0)
Monocytes Relative: 7.5 % (ref 3.0–12.0)
Neutro Abs: 3 10*3/uL (ref 1.4–7.7)
Neutrophils Relative %: 48.5 % (ref 43.0–77.0)
Platelets: 384 10*3/uL (ref 150.0–400.0)
RBC: 4.17 Mil/uL (ref 3.87–5.11)
RDW: 14.5 % (ref 11.5–15.5)
WBC: 6.1 10*3/uL (ref 4.0–10.5)

## 2019-11-19 LAB — HEPATIC FUNCTION PANEL
ALT: 45 U/L — ABNORMAL HIGH (ref 0–35)
AST: 30 U/L (ref 0–37)
Albumin: 4 g/dL (ref 3.5–5.2)
Alkaline Phosphatase: 66 U/L (ref 39–117)
Bilirubin, Direct: 0.1 mg/dL (ref 0.0–0.3)
Total Bilirubin: 0.6 mg/dL (ref 0.2–1.2)
Total Protein: 6.7 g/dL (ref 6.0–8.3)

## 2019-11-19 LAB — LIPID PANEL
Cholesterol: 139 mg/dL (ref 0–200)
HDL: 59 mg/dL (ref >39.00)
LDL Cholesterol: 65 mg/dL (ref 0–99)
NonHDL: 79.81
Total CHOL/HDL Ratio: 2
Triglycerides: 76 mg/dL (ref 0.0–149.0)
VLDL: 15.2 mg/dL (ref 0.0–40.0)

## 2019-11-19 LAB — BASIC METABOLIC PANEL
BUN: 10 mg/dL (ref 6–23)
CO2: 26 mEq/L (ref 19–32)
Calcium: 9.3 mg/dL (ref 8.4–10.5)
Chloride: 103 mEq/L (ref 96–112)
Creatinine, Ser: 0.56 mg/dL (ref 0.40–1.20)
GFR: 142.79 mL/min (ref >60.00)
Glucose, Bld: 96 mg/dL (ref 70–99)
Potassium: 4 mEq/L (ref 3.5–5.1)
Sodium: 137 mEq/L (ref 135–145)

## 2019-11-19 LAB — VITAMIN B12: Vitamin B-12: 370 pg/mL (ref 211–911)

## 2019-11-19 LAB — IBC PANEL
Iron: 55 ug/dL (ref 42–145)
Saturation Ratios: 11.9 % — ABNORMAL LOW (ref 20.0–50.0)
Transferrin: 329 mg/dL (ref 212.0–360.0)

## 2019-11-19 LAB — FOLATE: Folate: 15.9 ng/mL (ref >5.9)

## 2019-11-19 LAB — VITAMIN D 25 HYDROXY (VIT D DEFICIENCY, FRACTURES): VITD: 25.4 ng/mL — ABNORMAL LOW (ref 30.00–100.00)

## 2019-11-19 NOTE — Patient Instructions (Addendum)
Health Maintenance, Female Adopting a healthy lifestyle and getting preventive care are important in promoting health and wellness. Ask your health care provider about:  The right schedule for you to have regular tests and exams.  Things you can do on your own to prevent diseases and keep yourself healthy. What should I know about diet, weight, and exercise? Eat a healthy diet   Eat a diet that includes plenty of vegetables, fruits, low-fat dairy products, and lean protein.  Do not eat a lot of foods that are high in solid fats, added sugars, or sodium. Maintain a healthy weight Body mass index (BMI) is used to identify weight problems. It estimates body fat based on height and weight. Your health care provider can help determine your BMI and help you achieve or maintain a healthy weight. Get regular exercise Get regular exercise. This is one of the most important things you can do for your health. Most adults should:  Exercise for at least 150 minutes each week. The exercise should increase your heart rate and make you sweat (moderate-intensity exercise).  Do strengthening exercises at least twice a week. This is in addition to the moderate-intensity exercise.  Spend less time sitting. Even light physical activity can be beneficial. Watch cholesterol and blood lipids Have your blood tested for lipids and cholesterol at 43 years of age, then have this test every 5 years. Have your cholesterol levels checked more often if:  Your lipid or cholesterol levels are high.  You are older than 43 years of age.  You are at high risk for heart disease. What should I know about cancer screening? Depending on your health history and family history, you may need to have cancer screening at various ages. This may include screening for:  Breast cancer.  Cervical cancer.  Colorectal cancer.  Skin cancer.  Lung cancer. What should I know about heart disease, diabetes, and high blood  pressure? Blood pressure and heart disease  High blood pressure causes heart disease and increases the risk of stroke. This is more likely to develop in people who have high blood pressure readings, are of African descent, or are overweight.  Have your blood pressure checked: ? Every 3-5 years if you are 18-39 years of age. ? Every year if you are 40 years old or older. Diabetes Have regular diabetes screenings. This checks your fasting blood sugar level. Have the screening done:  Once every three years after age 40 if you are at a normal weight and have a low risk for diabetes.  More often and at a younger age if you are overweight or have a high risk for diabetes. What should I know about preventing infection? Hepatitis B If you have a higher risk for hepatitis B, you should be screened for this virus. Talk with your health care provider to find out if you are at risk for hepatitis B infection. Hepatitis C Testing is recommended for:  Everyone born from 1945 through 1965.  Anyone with known risk factors for hepatitis C. Sexually transmitted infections (STIs)  Get screened for STIs, including gonorrhea and chlamydia, if: ? You are sexually active and are younger than 43 years of age. ? You are older than 43 years of age and your health care provider tells you that you are at risk for this type of infection. ? Your sexual activity has changed since you were last screened, and you are at increased risk for chlamydia or gonorrhea. Ask your health care provider if   you are at risk.  Ask your health care provider about whether you are at high risk for HIV. Your health care provider may recommend a prescription medicine to help prevent HIV infection. If you choose to take medicine to prevent HIV, you should first get tested for HIV. You should then be tested every 3 months for as long as you are taking the medicine. Pregnancy  If you are about to stop having your period (premenopausal) and  you may become pregnant, seek counseling before you get pregnant.  Take 400 to 800 micrograms (mcg) of folic acid every day if you become pregnant.  Ask for birth control (contraception) if you want to prevent pregnancy. Osteoporosis and menopause Osteoporosis is a disease in which the bones lose minerals and strength with aging. This can result in bone fractures. If you are 65 years old or older, or if you are at risk for osteoporosis and fractures, ask your health care provider if you should:  Be screened for bone loss.  Take a calcium or vitamin D supplement to lower your risk of fractures.  Be given hormone replacement therapy (HRT) to treat symptoms of menopause. Follow these instructions at home: Lifestyle  Do not use any products that contain nicotine or tobacco, such as cigarettes, e-cigarettes, and chewing tobacco. If you need help quitting, ask your health care provider.  Do not use street drugs.  Do not share needles.  Ask your health care provider for help if you need support or information about quitting drugs. Alcohol use  Do not drink alcohol if: ? Your health care provider tells you not to drink. ? You are pregnant, may be pregnant, or are planning to become pregnant.  If you drink alcohol: ? Limit how much you use to 0-1 drink a day. ? Limit intake if you are breastfeeding.  Be aware of how much alcohol is in your drink. In the [place] one drink equals one 12 oz bottle of beer (355 mL), one 5 oz glass of wine (148 mL), or one 1 oz glass of hard liquor (44 mL). General instructions  Schedule regular health, dental, and eye exams.  Stay current with your vaccines.  Tell your health care provider if: ? You often feel depressed. ? You have ever been abused or do not feel safe at home. Summary  Adopting a healthy lifestyle and getting preventive care are important in promoting health and wellness.  Follow your health care provider's instructions about healthy  diet, exercising, and getting tested or screened for diseases.  Follow your health care provider's instructions on monitoring your cholesterol and blood pressure. This information is not intended to replace advice given to you by your health care provider. Make sure you discuss any questions you have with your health care provider. Document Revised: 07/19/2018 Document Reviewed: 07/19/2018 Elsevier Patient Education  2020 [employer]  

## 2019-11-19 NOTE — Progress Notes (Signed)
Subjective:  Patient ID: Robyn Bryant, female    DOB: 10-28-1975  Age: 44 y.o. MRN: [id number]  CC: Annual Exam and Anemia  This visit occurred during the SARS-CoV-2 public health emergency.  Safety protocols were in place, including screening questions prior to the visit, additional usage of staff PPE, and extensive cleaning of exam room while observing appropriate contact time as indicated for disinfecting solutions.    HPI Robyn Bryant presents for a CPX.  She is status post bariatric surgery and tells me that she feels well.  She takes her vitamin.  She tells me that her energy level and endurance are good.  She denies chest pain, shortness of breath, abdominal pain, constipation, or diarrhea.  She has no symptoms of heartburn or indigestion.  Outpatient Medications Prior to Visit  Medication Sig Dispense Refill  . rizatriptan (MAXALT) 10 MG tablet TAKE 1 TAB AT ONSET OF HEADACHE MAY REPEAT IN 2 HOURS. MAX 2/24 HOURS FOR 30 DAYS    . B-D 3CC LUER-LOK SYR 22GX1" 22G X 1" 3 ML MISC USE FOR B12 MONTHLY INJECTION    . B-D 3CC LUER-LOK SYR 23GX1" 23G X 1" 3 ML MISC USE FOR B12 MONTHLY INJECTION 1 each 6  . cyanocobalamin (,VITAMIN B-12,) 1000 MCG/ML injection INJECT 1 ML (1,000 MCG TOTAL) INTO THE MUSCLE ONCE FOR 1 DOSE.    . Galcanezumab-gnlm (EMGALITY) 120 MG/ML SOAJ Inject 120 mg into the skin every 30 (thirty) days.    [name] lamoTRIgine (LAMICTAL) 100 MG tablet Take by mouth.    . SYRINGE-NEEDLE, DISP, 3 ML 25G X 1" 3 ML MISC Use in inject b12 monthly 1 each 6   No facility-administered medications prior to visit.    ROS Review of Systems  Constitutional: Negative for appetite change, chills, diaphoresis, fatigue and fever.  HENT: Negative.   Eyes: Negative.   Respiratory: Negative for cough, chest tightness, shortness of breath and wheezing.   Cardiovascular: Negative for chest pain, palpitations and leg swelling.  Gastrointestinal: Negative for abdominal pain, constipation,  diarrhea, nausea and vomiting.  Endocrine: Negative.   Genitourinary: Negative for difficulty urinating, menstrual problem and vaginal bleeding.       Her periods are long and irregular  Musculoskeletal: Negative.  Negative for arthralgias and myalgias.  Skin: Positive for pallor.  Neurological: Negative.  Negative for dizziness, weakness, light-headedness, numbness and headaches.  Hematological: Negative for adenopathy. Does not bruise/bleed easily.  Psychiatric/Behavioral: Negative.     Objective:  BP 120/80 (BP Location: Left Arm, Patient Position: Sitting, Cuff Size: Large)   Pulse 73   Temp 98.2 F (36.8 C) (Oral)   Resp 16   Ht 5\' 4"  (1.626 m)   Wt 140 lb 4 oz (63.6 kg)   LMP 10/19/2019 Comment: BTL  SpO2 99%   BMI 24.07 kg/m   BP Readings from Last 3 Encounters:  11/19/19 120/80  10/18/18 104/80  09/13/18 119/69    Wt Readings from Last 3 Encounters:  11/19/19 140 lb 4 oz (63.6 kg)  10/24/18 188 lb 4.8 oz (85.4 kg)  10/18/18 194 lb 4 oz (88.1 kg)    Physical Exam Vitals reviewed.  Constitutional:      Appearance: Normal appearance.  HENT:     Nose: Nose normal.     Mouth/Throat:     Mouth: Mucous membranes are moist. Mucous membranes are pale.  Eyes:     General: No scleral icterus.    Conjunctiva/sclera: Conjunctivae normal.  Cardiovascular:     Rate  and Rhythm: Normal rate and regular rhythm.     Heart sounds: No murmur.  Pulmonary:     Effort: Pulmonary effort is normal.     Breath sounds: No stridor. No wheezing, rhonchi or rales.  Abdominal:     General: Abdomen is flat. Bowel sounds are normal. There is no distension.     Palpations: Abdomen is soft. There is no hepatomegaly, splenomegaly or mass.     Tenderness: There is no abdominal tenderness.  Musculoskeletal:        General: Normal range of motion.     Cervical back: Normal range of motion and neck supple.     Right lower leg: No edema.     Left lower leg: No edema.  Skin:    General:  Skin is warm and dry.     Coloration: Skin is pale.     Findings: No rash.  Neurological:     General: No focal deficit present.     Mental Status: She is alert.  Psychiatric:        Mood and Affect: Mood normal.        Behavior: Behavior normal.     Lab Results  Component Value Date   WBC 6.1 11/19/2019   HGB 11.6 (L) 11/19/2019   HCT 34.2 (L) 11/19/2019   PLT 384.0 11/19/2019   GLUCOSE 96 11/19/2019   CHOL 139 11/19/2019   TRIG 76.0 11/19/2019   HDL 59.00 11/19/2019   LDLCALC 65 11/19/2019   ALT 45 (H) 11/19/2019   AST 30 11/19/2019   NA 137 11/19/2019   K 4.0 11/19/2019   CL 103 11/19/2019   CREATININE 0.56 11/19/2019   BUN 10 11/19/2019   CO2 26 11/19/2019   TSH 1.53 05/31/2019   HGBA1C 5.2 05/31/2019    DG Chest 2 View  Result Date: 09/08/2018 CLINICAL DATA:  Preop gastric bypass EXAM: CHEST - 2 VIEW COMPARISON:  06/06/2013 FINDINGS: The heart size and mediastinal contours are within normal limits. Both lungs are clear. Cholecystectomy clips are seen in the right upper quadrant. The visualized skeletal structures are unremarkable. IMPRESSION: No active cardiopulmonary disease. Electronically Signed   By: Tollie Eth M.D.   On: 09/08/2018 03:19    Assessment & Plan:   Robyn Bryant was seen today for annual exam and anemia.  Diagnoses and all orders for this visit:  B12 deficiency- Her B12 and folate levels are normal. -     CBC with Differential/Platelet; Future -     Vitamin B12; Future -     Folate; Future -     Folate -     Vitamin B12 -     CBC with Differential/Platelet  Thiamine deficiency- Her thiamine level is normal. -     Vitamin B1; Future -     Vitamin B1  Vitamin D deficiency- Her vitamin D level is normal. -     VITAMIN D 25 Hydroxy (Vit-D Deficiency, Fractures); Future -     Basic metabolic panel; Future -     VITAMIN D 25 Hydroxy (Vit-D Deficiency, Fractures) -     Basic metabolic panel  Routine general medical examination at a health  care facility- Exam completed, labs reviewed, no vaccines are indicated, she is due for breast cancer screening so I ordered a mammogram, she is due for cervical cancer screening, colon cancer screening is not indicated at her age. -     Lipid panel; Future -     Lipid panel  Gastric bypass  status for obesity-she is anemic so I will screen her for vitamin and zinc deficiencies. -     IBC panel; Future -     Hepatic function panel; Future -     Hepatic function panel -     IBC panel -     Zinc; Future  Visit for screening mammogram -     MM DIGITAL SCREENING BILATERAL; Future  Cervical cancer screening -     Ambulatory referral to Gynecology  Elevated LFTs- Her LFTs are elevated.  I will screen her for viral hepatitis.       -     Cancel: Hepatitis B surface antibody,quantitative; Future -     Cancel: Hepatitis B surface antigen; Future -     Cancel: Hepatitis A antibody, total; Future -     Cancel: Hepatitis B core antibody, total; Future -     Cancel: Hepatitis C antibody; Future -     Hepatitis A Antibody, Total; Future -     Hepatitis B Core Antibody, total; Future -     Hepatitis B surface antibody,quantitative; Future -     Hepatitis C Antibody; Future -     Hepatitis B surface antigen; Future  Deficiency anemia -     Zinc; Future   I am having [name] maintain her Galcanezumab-gnlm, SYRINGE-NEEDLE (DISP) 3 ML, B-D 3CC LUER-LOK SYR 23GX1", cyanocobalamin, lamoTRIgine, rizatriptan, and B-D 3CC LUER-LOK SYR 22GX1".  No orders of the defined types were placed in this encounter.  In addition to time spent on CPE, I spent 50 minutes in preparing to see the patient by review of recent labs, imaging and procedures, obtaining and reviewing separately obtained history, communicating with the patient and family or caregiver, ordering medications, tests or procedures, and documenting clinical information in the EHR including the differential Dx, treatment, and any further  evaluation and other management of 1. B12 deficiency 2. Thiamine deficiency 3. Vitamin D deficiency 4. Gastric bypass status for obesity 5. Elevated LFTs 6. Deficiency anemia     Follow-up: Return in about 3 months (around 02/18/2020).  Sanda Linger, MD

## 2019-11-20 ENCOUNTER — Encounter: Payer: Self-pay | Admitting: Internal Medicine

## 2019-11-20 DIAGNOSIS — R7989 Other specified abnormal findings of blood chemistry: Secondary | ICD-10-CM | POA: Insufficient documentation

## 2019-11-22 ENCOUNTER — Other Ambulatory Visit: Payer: Self-pay

## 2019-11-22 ENCOUNTER — Other Ambulatory Visit: Payer: 59

## 2019-11-22 DIAGNOSIS — R7989 Other specified abnormal findings of blood chemistry: Secondary | ICD-10-CM

## 2019-11-22 LAB — VITAMIN B1: Vitamin B1 (Thiamine): 19 nmol/L (ref 8–30)

## 2019-11-23 ENCOUNTER — Encounter: Payer: Self-pay | Admitting: Internal Medicine

## 2019-11-23 ENCOUNTER — Other Ambulatory Visit: Payer: 59

## 2019-11-23 DIAGNOSIS — D539 Nutritional anemia, unspecified: Secondary | ICD-10-CM

## 2019-11-23 DIAGNOSIS — R7989 Other specified abnormal findings of blood chemistry: Secondary | ICD-10-CM

## 2019-11-23 DIAGNOSIS — Z9884 Bariatric surgery status: Secondary | ICD-10-CM

## 2019-11-26 ENCOUNTER — Encounter: Payer: Self-pay | Admitting: Internal Medicine

## 2019-11-26 LAB — HEPATITIS B SURFACE ANTIGEN: Hepatitis B Surface Ag: NONREACTIVE

## 2019-11-26 LAB — HEPATITIS C ANTIBODY
Hepatitis C Ab: NONREACTIVE
SIGNAL TO CUT-OFF: 0.01 (ref <1.00)

## 2019-11-26 LAB — HEPATITIS B CORE ANTIBODY, TOTAL: Hep B Core Total Ab: NONREACTIVE

## 2019-11-26 LAB — HEPATITIS B SURFACE ANTIBODY, QUANTITATIVE: Hep B S AB Quant (Post): <5 m[IU]/mL — ABNORMAL LOW (ref >=10)

## 2019-11-26 LAB — HEPATITIS A ANTIBODY, TOTAL: Hepatitis A AB,Total: NONREACTIVE

## 2019-11-26 LAB — ZINC: Zinc: 81 ug/dL (ref 60–130)

## 2019-11-28 ENCOUNTER — Encounter: Payer: BC Managed Care – PPO | Admitting: Internal Medicine

## 2019-12-03 ENCOUNTER — Ambulatory Visit (INDEPENDENT_AMBULATORY_CARE_PROVIDER_SITE_OTHER): Payer: 59 | Admitting: *Deleted

## 2019-12-03 ENCOUNTER — Other Ambulatory Visit: Payer: Self-pay

## 2019-12-03 DIAGNOSIS — Z23 Encounter for immunization: Secondary | ICD-10-CM

## 2019-12-03 DIAGNOSIS — E538 Deficiency of other specified B group vitamins: Secondary | ICD-10-CM | POA: Diagnosis not present

## 2019-12-04 ENCOUNTER — Encounter: Payer: Self-pay | Admitting: Internal Medicine

## 2019-12-05 MED ORDER — CYANOCOBALAMIN 1000 MCG/ML IJ SOLN
1000.0000 ug | Freq: Once | INTRAMUSCULAR | Status: AC
  Administered 2019-12-03: 1000 ug via INTRAMUSCULAR

## 2019-12-05 NOTE — Addendum Note (Signed)
Addended by: Verlan Friends on: 12/05/2019 01:33 PM   Modules accepted: Orders

## 2019-12-05 NOTE — Progress Notes (Signed)
I have reviewed and agree.

## 2019-12-11 ENCOUNTER — Encounter: Payer: Self-pay | Admitting: Internal Medicine

## 2020-01-02 ENCOUNTER — Other Ambulatory Visit: Payer: Self-pay

## 2020-01-02 ENCOUNTER — Ambulatory Visit (INDEPENDENT_AMBULATORY_CARE_PROVIDER_SITE_OTHER): Payer: BC Managed Care – PPO | Admitting: *Deleted

## 2020-01-02 DIAGNOSIS — Z23 Encounter for immunization: Secondary | ICD-10-CM

## 2020-02-10 ENCOUNTER — Ambulatory Visit
Admission: EM | Admit: 2020-02-10 | Discharge: 2020-02-10 | Disposition: A | Discharge status: Home / Self Care | Payer: BC Managed Care – PPO | Attending: Emergency Medicine | Admitting: Emergency Medicine

## 2020-02-10 ENCOUNTER — Ambulatory Visit (INDEPENDENT_AMBULATORY_CARE_PROVIDER_SITE_OTHER): Payer: BC Managed Care – PPO

## 2020-02-10 ENCOUNTER — Other Ambulatory Visit: Payer: Self-pay

## 2020-02-10 ENCOUNTER — Encounter: Payer: Self-pay | Admitting: Emergency Medicine

## 2020-02-10 DIAGNOSIS — M25571 Pain in right ankle and joints of right foot: Secondary | ICD-10-CM

## 2020-02-10 DIAGNOSIS — W1830XA Fall on same level, unspecified, initial encounter: Secondary | ICD-10-CM | POA: Diagnosis not present

## 2020-02-10 DIAGNOSIS — S93401A Sprain of unspecified ligament of right ankle, initial encounter: Secondary | ICD-10-CM

## 2020-02-10 NOTE — Discharge Instructions (Signed)

## 2020-02-10 NOTE — ED Provider Notes (Signed)
[facility]    CSN: [id number] Arrival date & time: 02/10/20  0944      History   Chief Complaint Chief Complaint  Patient presents with  . Ankle Pain    HPI Robyn Bryant is a 44 y.o. female presenting for right ankle pain and swelling.  States that she fell on her knees when she tripped over her right foot.  States this is due to her foot feeling numb: Has resolved/is chronic for patient.  Endorsing pain, swelling.  No deformity, numbness, open wound.   Past Medical History:  Diagnosis Date  . Anxiety   . Arthritis    Bilateral knees, wrist  . Asthma    as teenager, none now  pt.denies  . Depression   . GERD (gastroesophageal reflux disease)   . History of carpal tunnel syndrome   . History of seizure    noted on  EEG, trigger by migraines  . Insomnia    resolved  . Low back pain    resolved  . Migraines   . Panniculitis   . PONV (postoperative nausea and vomiting)   . Thiamine deficiency   . Toe fracture, left 12/26/2015  . Vitamin B 12 deficiency   . Vitamin D deficiency    pt unaware    Patient Active Problem List   Diagnosis Date Noted  . Elevated LFTs 11/20/2019  . Cervical cancer screening 11/19/2019  . Deficiency anemia 10/18/2018  . Patellofemoral syndrome of both knees 11/09/2016  . Low back pain 08/24/2016  . Visit for screening mammogram 08/23/2016  . Vitamin D deficiency 02/03/2015  . Insomnia 01/18/2014  . Thiamine deficiency 12/24/2013  . Gastric bypass status for obesity 12/20/2013  . GERD (gastroesophageal reflux disease) 09/20/2013  . B12 deficiency 09/20/2013  . Obesity, Class III, BMI 40-49.9 (morbid obesity) s/p lap gleeve gastrectomy 05/10/2013  . Routine general medical examination at a health care facility 07/14/2011    Past Surgical History:  Procedure Laterality Date  . BREAST BIOPSY Left   . BREAST CYST ASPIRATION    . CARPAL TUNNEL RELEASE Right 2016  . CESAREAN SECTION  2001  . CHOLECYSTECTOMY N/A  09/07/2013   Procedure: LAPAROSCOPIC CHOLECYSTECTOMY WITH INTRAOPERATIVE CHOLANGIOGRAM;  Surgeon: Mariella Saa, MD;  Location: [facility];  Service: General;  Laterality: N/A;  . ESOPHAGOGASTRODUODENOSCOPY N/A 09/06/2013   Procedure: ESOPHAGOGASTRODUODENOSCOPY (EGD);  Surgeon: Kandis Cocking, MD;  Location: [facility];  Service: General;  Laterality: N/A;  . ESOPHAGOGASTRODUODENOSCOPY (EGD) WITH PROPOFOL N/A 10/24/2017   Procedure: ESOPHAGOGASTRODUODENOSCOPY (EGD) WITH PROPOFOL;  Surgeon: Ovidio Kin, MD;  Location: [facility];  Service: General;  Laterality: N/A;  . GASTRIC ROUX-EN-Y N/A 09/12/2018   Procedure: LAPAROSCOPIC ROUX-EN-Y GASTRIC BYPASS REVISION FROM GASTRIC SLEEVE WITH UPPER ENDOSCOPY, ERAS Pathway;  Surgeon: Glenna Fellows, MD;  Location: [facility];  Service: General;  Laterality: N/A;  . LAPAROSCOPIC GASTRIC SLEEVE RESECTION N/A 08/08/2013   Procedure: LAPAROSCOPIC GASTRIC SLEEVE RESECTION UPPER ENDOSCOPY;  Surgeon: Mariella Saa, MD;  Location: [facility];  Service: General;  Laterality: N/A;  . TUBAL LIGATION  2001  . UPPER GI ENDOSCOPY  08/08/2013   Procedure: UPPER GI ENDOSCOPY;  Surgeon: Mariella Saa, MD;  Location: [facility];  Service: General;;    OB History   No obstetric history on file.      Home Medications    Prior to Admission medications   Medication Sig Start Date End Date Taking? Authorizing Provider  B-D 3CC LUER-LOK SYR 22GX1" 22G X 1"  3 ML MISC USE FOR B12 MONTHLY INJECTION 09/27/19   [provider]  B-D 3CC LUER-LOK SYR 23GX1" 23G X 1" 3 ML MISC USE FOR B12 MONTHLY INJECTION 08/18/19   Etta Grandchild, MD  cyanocobalamin (,VITAMIN B-12,) 1000 MCG/ML injection INJECT 1 ML (1,000 MCG TOTAL) INTO THE MUSCLE ONCE FOR 1 DOSE. 09/27/19   [provider]  Galcanezumab-gnlm (EMGALITY) 120 MG/ML SOAJ Inject 120 mg into the skin every 30 (thirty) days.    [provider]  lamoTRIgine (LAMICTAL) 100 MG tablet Take by mouth.     [provider]  rizatriptan (MAXALT) 10 MG tablet TAKE 1 TAB AT ONSET OF HEADACHE MAY REPEAT IN 2 HOURS. MAX 2/24 HOURS FOR 30 DAYS 04/25/19   [provider]  SYRINGE-NEEDLE, DISP, 3 ML 25G X 1" 3 ML MISC Use in inject b12 monthly 10/18/18   Etta Grandchild, MD    Family History Family History  Problem Relation Age of Onset  . Diabetes Mother   . Hypertension Mother   . Alcohol abuse Father   . Asthma Sister     Social History Social History   Tobacco Use  . Smoking status: Never Smoker  . Smokeless tobacco: Never Used  Vaping Use  . Vaping Use: Never used  Substance Use Topics  . Alcohol use: No  . Drug use: No     Allergies   Morphine and related, Amoxicillin, Latex, Dilaudid [hydromorphone], and Gabapentin   Review of Systems As per HPI   Physical Exam Triage Vital Signs ED Triage Vitals [02/10/20 0955]  Enc Vitals Group     BP (!) 102/53     Pulse Rate 66     Resp 18     Temp 98.1 F (36.7 C)     Temp Source Oral     SpO2 100 %     Weight      Height      Head Circumference      Peak Flow      Pain Score 10     Pain Loc      Pain Edu?      Excl. in GC?    No data found.  Updated Vital Signs BP (!) 102/53 (BP Location: Left Arm)   Pulse 66   Temp 98.1 F (36.7 C) (Oral)   Resp 18   LMP 01/25/2020   SpO2 100%   Visual Acuity Right Eye Distance:   Left Eye Distance:   Bilateral Distance:    Right Eye Near:   Left Eye Near:    Bilateral Near:     Physical Exam Constitutional:      General: She is not in acute distress. HENT:     Head: Normocephalic and atraumatic.  Eyes:     General: No scleral icterus.    Pupils: Pupils are equal, round, and reactive to light.  Cardiovascular:     Rate and Rhythm: Normal rate.  Pulmonary:     Effort: Pulmonary effort is normal.  Musculoskeletal:     Comments: Moderate edema of right ankle with lateral and medial malleoli tenderness.  No deformity, bruising.  NVI.  Strength  deferred.  Skin:    Coloration: Skin is not jaundiced or pale.  Neurological:     Mental Status: She is alert and oriented to person, place, and time.      UC Treatments / Results  Labs (all labs ordered are listed, but only abnormal results are displayed) Labs Reviewed -  No data to display  EKG   Radiology DG Ankle Complete Right  Result Date: 02/10/2020 CLINICAL DATA:  Right ankle pain, fall EXAM: RIGHT ANKLE - COMPLETE 3+ VIEW COMPARISON:  None. FINDINGS: There is no evidence of fracture, dislocation, or joint effusion. There is no evidence of arthropathy or other focal bone abnormality. Soft tissue edema over the lateral malleolus. IMPRESSION: No fracture or dislocation of the right ankle. Joint spaces are preserved. Soft tissue edema over the lateral malleolus. Electronically Signed   By: Lauralyn Primes M.D.   On: 02/10/2020 10:18    Procedures Procedures (including critical care time)  Medications Ordered in UC Medications - No data to display  Initial Impression / Assessment and Plan / UC Course  I have reviewed the triage vital signs and the nursing notes.  Pertinent labs & imaging results that were available during my care of the patient were reviewed by me and considered in my medical decision making (see chart for details).     Right ankle x-ray done office, reviewed by me radiology: Negative for fracture dislocation of the right ankle.  Joint spaces preserved.  Soft tissue edema over lateral malleolus.  Reviewed findings with patient verbalized understanding.  Patient declined crutches, ASO brace/boot.  Applied Ace wrap in office which she tolerated well.  Provided sports medicine contact information if needed for follow-up.  Return precautions discussed, patient verbalized understanding and is agreeable to plan. Final Clinical Impressions(s) / UC Diagnoses   Final diagnoses:  Sprain of right ankle, unspecified ligament, initial encounter     Discharge  Instructions     Recommend RICE: rest, ice, compression, elevation as needed for pain.    Heat therapy (hot compress, warm wash rag, hot showers, etc.) can help relax muscles and soothe muscle aches. Cold therapy (ice packs) can be used to help swelling both after injury and after prolonged use of areas of chronic pain/aches.  For pain: recommend 350 mg-1000 mg of Tylenol (acetaminophen) and/or 200 mg - 800 mg of Advil (ibuprofen, Motrin) every 8 hours as needed.  May alternate between the two throughout the day as they are generally safe to take together.  DO NOT exceed more than 3000 mg of Tylenol or 3200 mg of ibuprofen in a 24 hour period as this could damage your stomach, kidneys, liver, or increase your bleeding risk.    ED Prescriptions    None     PDMP not reviewed this encounter.   Hall[place], [place], [place] 02/10/20 1100

## 2020-02-10 NOTE — ED Triage Notes (Signed)
Pt presents to [facility] for assessment of right ankle, lower leg and foot pain after she fell forward onto her knees on a slab when she tried to get up while he feet fell numb.  Patient has abrasions to bilateral knees, denies knee pain.  Pt c/o external rotation.

## 2020-02-13 ENCOUNTER — Ambulatory Visit (INDEPENDENT_AMBULATORY_CARE_PROVIDER_SITE_OTHER): Payer: BC Managed Care – PPO

## 2020-02-13 ENCOUNTER — Other Ambulatory Visit: Payer: Self-pay

## 2020-02-13 ENCOUNTER — Ambulatory Visit (INDEPENDENT_AMBULATORY_CARE_PROVIDER_SITE_OTHER): Payer: BC Managed Care – PPO | Admitting: Family Medicine

## 2020-02-13 ENCOUNTER — Ambulatory Visit: Payer: Self-pay

## 2020-02-13 VITALS — BP 116/70 | HR 89 | Ht 64.0 in | Wt 140.0 lb

## 2020-02-13 DIAGNOSIS — M79604 Pain in right leg: Secondary | ICD-10-CM | POA: Diagnosis not present

## 2020-02-13 DIAGNOSIS — M25571 Pain in right ankle and joints of right foot: Secondary | ICD-10-CM

## 2020-02-13 DIAGNOSIS — R203 Hyperesthesia: Secondary | ICD-10-CM

## 2020-02-13 DIAGNOSIS — M79671 Pain in right foot: Secondary | ICD-10-CM

## 2020-02-13 MED ORDER — PREGABALIN 75 MG PO CAPS: 75.0000 mg | ORAL_CAPSULE | Freq: Two times a day (BID) | ORAL | 3 refills | Status: DC

## 2020-02-13 NOTE — Patient Instructions (Addendum)
Thank you for coming in today. Plan for xray today.  Try Lyrica for nerve pain up to 2x daily.  Ok to take tylenol Ok to also use topical voltaren gel over the counter.  Use crutches or a walker.  OK to use a cam walker boot as needed.  Bring the boot with you on recheck.   OK to use ice if if helps.    Use regular Vaseline on your knee wounds.   Recheck in 1 week.

## 2020-02-13 NOTE — Progress Notes (Signed)
I, Christoper Fabian, LAT, ATC, am serving as scribe for Dr. Clementeen Graham.  Robyn Bryant is a 44 y.o. female who presents to [facility] today for R ankle pain after suffering an ankle injury on 02/10/20.  She was seen at the [facility] on 02/10/20 for her ankle and had a R ankle XR.  She locates her pain to the entire ankle. Unable to bear weight. Toes are numb.  She notes even light touch is quite painful.  She does have an left of her cam walker boot but cannot tolerate having it on her leg.  Also having right knee pain. Feel directly onto knee. Pain to both sides of patella.    Diagnostic imaging: R ankle XR- 02/10/20  Pertinent review of systems: No fevers or chills  Relevant historical information: History gastric bypass surgery with significant weight loss. B12 checked in April normal.  Vitamin D checked in April low at 25  Exam:  BP 116/70   Pulse 89   Ht 5\' 4"  (1.626 m)   Wt 140 lb (63.5 kg)   LMP 01/25/2020   SpO2 98%   BMI 24.03 kg/m  General: Well Developed, well nourished, and in no acute distress.   MSK: Right knee abrasion anterior knee with no effusion.  Mildly tender palpation at anterior knee near abrasion. Normal knee motion.  Guarding with ligament exam testing.  Right ankle swollen and bruised most prominently at lateral malleolus. Hyperesthesia pain to even light touch throughout the entirety of the foot and ankle. Very limited motion.  Motion is limited by guarding. Unable to complete strength or stability testing due to guarding. Pulses and cap refill are intact distally. Sensation to light touch is intact but Sowles pain.   Lab and Radiology Results No results found for this or any previous visit (from the past 72 hour(s)). DG Ankle Complete Right  Result Date: 02/10/2020 CLINICAL DATA:  Right ankle pain, fall EXAM: RIGHT ANKLE - COMPLETE 3+ VIEW COMPARISON:  None. FINDINGS: There is no evidence of fracture,  dislocation, or joint effusion. There is no evidence of arthropathy or other focal bone abnormality. Soft tissue edema over the lateral malleolus. IMPRESSION: No fracture or dislocation of the right ankle. Joint spaces are preserved. Soft tissue edema over the lateral malleolus. Electronically Signed   By: 04/12/2020 M.D.   On: 02/10/2020 10:18  I, 04/12/2020, personally (independently) visualized and performed the interpretation of the images attached in this note.   X-ray images right foot and right tib-fib obtained today personally and independently reviewed.  Right foot: No acute fractures.  Soft tissue swelling.  Right tib-fib soft tissue swelling with no fractures or significant bony changes tib-fib.  Await formal radiology review  Assessment and Plan: 44 y.o. female with right foot and ankle pain after ankle sprain.  Patient has bruising and swelling as expected following a more significant ankle sprain.  However she has significant hyperesthesia which is not to be expected.  Doubtful for single isolated nerve injury given the diffuseness of her symptoms.  They do not follow typical dermatomal pattern.  Right now she is too painful to really truly participate in much physical therapy or home exercise program.  Plan for relative rest and trial of Lyrica for a bit of desensitization.  We will go ahead and refer to physical therapy with the hopes of starting in about 2 weeks.  Recheck back in 1 week to reassess and reexamine.  Hopefully she will be a little less painful and I will be able to do more exam testing at that point.  Knee pain and knee abrasion: Transition to Vaseline and stop Neosporin.   PDMP not reviewed this encounter. Orders Placed This Encounter  Procedures  . DG Foot Complete Right    Standing Status:   Future    Number of Occurrences:   1    Standing Expiration Date:   02/12/2021    Order Specific Question:   Reason for Exam (SYMPTOM  OR DIAGNOSIS REQUIRED)    Answer:    eval foot pain    Order Specific Question:   Is patient pregnant?    Answer:   No    Order Specific Question:   Preferred imaging location?    Answer:   [facility]    Order Specific Question:   Radiology Contrast Protocol - do NOT remove file path    Answer:   \\[email or web address]  . DG Tibia/Fibula Right    Standing Status:   Future    Number of Occurrences:   1    Standing Expiration Date:   02/12/2021    Order Specific Question:   Reason for Exam (SYMPTOM  OR DIAGNOSIS REQUIRED)    Answer:   eval right leg pain    Order Specific Question:   Is patient pregnant?    Answer:   No    Order Specific Question:   Preferred imaging location?    Answer:   [facility]    Order Specific Question:   Radiology Contrast Protocol - do NOT remove file path    Answer:   \\[email or web address]  . Ambulatory referral to Physical Therapy    Referral Priority:   Routine    Referral Type:   Physical Medicine    Referral Reason:   Specialty Services Required    Requested Specialty:   Physical Therapy   Meds ordered this encounter  Medications  . pregabalin (LYRICA) 75 MG capsule    Sig: Take 1 capsule (75 mg total) by mouth 2 (two) times daily. Nerve pain    Dispense:  60 capsule    Refill:  3     Discussed warning signs or symptoms. Please see discharge instructions. Patient expresses understanding.   The above documentation has been reviewed and is accurate and complete Clementeen Graham, M.D.

## 2020-02-14 NOTE — Progress Notes (Signed)
Xray lower leg is normal appearing

## 2020-02-14 NOTE — Progress Notes (Signed)
Xray foot looks normal. No fractures.

## 2020-02-15 ENCOUNTER — Encounter: Payer: Self-pay | Admitting: Family Medicine

## 2020-02-20 ENCOUNTER — Ambulatory Visit: Payer: Self-pay

## 2020-02-20 ENCOUNTER — Encounter: Payer: Self-pay | Admitting: Family Medicine

## 2020-02-20 ENCOUNTER — Ambulatory Visit (INDEPENDENT_AMBULATORY_CARE_PROVIDER_SITE_OTHER): Payer: BC Managed Care – PPO | Admitting: Family Medicine

## 2020-02-20 ENCOUNTER — Other Ambulatory Visit: Payer: Self-pay

## 2020-02-20 VITALS — BP 92/64 | HR 64 | Ht 64.0 in | Wt 134.0 lb

## 2020-02-20 DIAGNOSIS — M25571 Pain in right ankle and joints of right foot: Secondary | ICD-10-CM

## 2020-02-20 NOTE — Patient Instructions (Signed)
Thank you for coming in today. Plan for PT as scheduled.  Ok to transition to ankle brace when able.  Continue cam walker boot as needed.  Recheck in 4 weeks.  Let me know sooner if you are not doing well.

## 2020-02-20 NOTE — Progress Notes (Signed)
I, Christoper Fabian, LAT, ATC, am serving as scribe for Dr. Clementeen Graham.  Robyn Bryant is a 44 y.o. female who presents to [facility] at [facility] today for f/u of R ankle and foot pain after suffering an ankle injury on 02/10/20.  She was last seen by Dr. Denyse Amass on 02/13/20 w/ significant hyperesthesia to the R foot and ankle and was prescribed Lyrica and desensitization.  She was also referred to PT.  Since her last visit, pt reports that her R ankle is better but notes that she con't to get a shooting pain up her shin.  She states that the swelling is better.  She states that she has been in a walking boot since Monday and reports being able to get off her crutches since yesterday.  She rates her current pain as mild-mod.  She notes that she has the most pain first thing in the morning and then again at the end of the day.  Diagnostic imaging: R foot and tib/fib XR- 02/13/20; R ankle XR- 02/10/20  Pertinent review of systems: No fevers or chills  Relevant historical information: Allergic to gabapentin.  Allergy clarified as hives.   Exam:  BP 92/64 (BP Location: Right Arm, Patient Position: Sitting, Cuff Size: Normal)   Pulse 64   Ht 5\' 4"  (1.626 m)   Wt 134 lb (60.8 kg)   LMP 01/25/2020   SpO2 99%   BMI 23.00 kg/m  General: Well Developed, well nourished, and in no acute distress.   MSK: Right ankle bruising at lateral and medial aspect of ankle.  Tender palpation ATFL area. Decreased motion.  Still too much guarding for true ligament laxity testing. Skin is much less tender to palpation.   Lab and Radiology Results DG Tibia/Fibula Right  Result Date: 02/13/2020 CLINICAL DATA:  Fall 5 days ago, pain EXAM: RIGHT TIBIA AND FIBULA - 2 VIEW COMPARISON:  02/10/2020 FINDINGS: No acute bony abnormality. Specifically, no fracture, subluxation, or dislocation. Soft tissues are intact. Joint spaces maintained. IMPRESSION: Negative. Electronically Signed   By: 04/12/2020 M.D.    On: 02/13/2020 21:19   DG Ankle Complete Right  Result Date: 02/10/2020 CLINICAL DATA:  Right ankle pain, fall EXAM: RIGHT ANKLE - COMPLETE 3+ VIEW COMPARISON:  None. FINDINGS: There is no evidence of fracture, dislocation, or joint effusion. There is no evidence of arthropathy or other focal bone abnormality. Soft tissue edema over the lateral malleolus. IMPRESSION: No fracture or dislocation of the right ankle. Joint spaces are preserved. Soft tissue edema over the lateral malleolus. Electronically Signed   By: 04/12/2020 M.D.   On: 02/10/2020 10:18   DG Foot Complete Right  Result Date: 02/13/2020 CLINICAL DATA:  Fall 5 days ago, foot pain EXAM: RIGHT FOOT COMPLETE - 3+ VIEW COMPARISON:  Ankle series 02/10/2020 FINDINGS: There is no evidence of fracture or dislocation. There is no evidence of arthropathy or other focal bone abnormality. Soft tissues are unremarkable. IMPRESSION: Negative. Electronically Signed   By: 04/12/2020 M.D.   On: 02/13/2020 21:19  I, 04/15/2020, personally (independently) visualized and performed the interpretation of the images attached in this note.      Assessment and Plan: 45 y.o. female with right ankle sprain.  Improved after 1 week with less hyperesthesia.  Currently in cam walker boot.  Provided ASO brace which patient can wean into when able.  She already has physical therapy scheduled for next week.  Recheck back in 1 month or  sooner if needed.   Discussed warning signs or symptoms. Please see discharge instructions. Patient expresses understanding.   The above documentation has been reviewed and is accurate and complete Clementeen Graham, M.D.

## 2020-02-25 ENCOUNTER — Encounter: Payer: Self-pay | Admitting: Internal Medicine

## 2020-02-26 ENCOUNTER — Ambulatory Visit: Payer: BC Managed Care – PPO | Admitting: Physical Therapy

## 2020-02-27 NOTE — Telephone Encounter (Signed)
  Patient states form has not been completed properly Please call [phone]

## 2020-02-29 NOTE — Telephone Encounter (Signed)
Spoke to pt and she stated that she has picked up the corrected form.

## 2020-03-10 ENCOUNTER — Telehealth: Payer: Self-pay | Admitting: Family Medicine

## 2020-03-10 NOTE — Telephone Encounter (Signed)
Patient called stating that she was given a brace but it did not work for her. She returned it and contacted [employer] but her insurance is still being charged. I advised her to reach back out to them regarding this issue but wanted you to be aware also.

## 2020-03-13 ENCOUNTER — Encounter: Payer: Self-pay | Admitting: Internal Medicine

## 2020-03-14 ENCOUNTER — Encounter: Payer: Self-pay | Admitting: Internal Medicine

## 2020-03-18 NOTE — Telephone Encounter (Signed)
Spoke to [employer] regarding this pt and he said he'll follow-up.

## 2020-03-19 ENCOUNTER — Ambulatory Visit: Payer: BC Managed Care – PPO | Admitting: Family Medicine

## 2020-04-04 ENCOUNTER — Encounter (HOSPITAL_COMMUNITY): Payer: Self-pay

## 2020-04-07 ENCOUNTER — Ambulatory Visit
Admission: EM | Admit: 2020-04-07 | Discharge: 2020-04-07 | Disposition: A | Discharge status: Home / Self Care | Payer: BC Managed Care – PPO | Attending: Emergency Medicine | Admitting: Emergency Medicine

## 2020-04-07 ENCOUNTER — Encounter: Payer: Self-pay | Admitting: Internal Medicine

## 2020-04-07 DIAGNOSIS — R1013 Epigastric pain: Secondary | ICD-10-CM

## 2020-04-07 MED ORDER — LIDOCAINE VISCOUS HCL 2 % MT SOLN
15.0000 mL | Freq: Once | OROMUCOSAL | Status: AC
  Administered 2020-04-07: 15 mL via ORAL

## 2020-04-07 MED ORDER — ALUM & MAG HYDROXIDE-SIMETH 200-200-20 MG/5ML PO SUSP
30.0000 mL | Freq: Once | ORAL | Status: AC
  Administered 2020-04-07: 30 mL via ORAL

## 2020-04-07 MED ORDER — ALUM & MAG HYDROXIDE-SIMETH 400-400-40 MG/5ML PO SUSP: 15.0000 mL | Freq: Four times a day (QID) | ORAL | 0 refills | Status: DC | PRN

## 2020-04-07 MED ORDER — PANTOPRAZOLE SODIUM 20 MG PO TBEC: 40.0000 mg | DELAYED_RELEASE_TABLET | Freq: Every day | ORAL | 0 refills | Status: DC

## 2020-04-07 NOTE — ED Provider Notes (Signed)
[facility]    CSN: [id number] Arrival date & time: 04/07/20  1239      History   Chief Complaint Chief Complaint  Patient presents with  . Abdominal Pain    HPI Robyn Bryant is a 44 y.o. female history of prior cholecystectomy, gastric bypass with revision, GERD, presenting today for evaluation of abdominal pain.  Patient reports beginning Thursday she was having intermittent sharp sensation in her upper abdomen.  Since it has become more constant and has been worsening.  Describes it as a stabbing sensation.  She has been eating and drinking normally without affecting pain.  Reports normal bowel movements as well without diarrhea or constipation.  She had normal bowel movement this morning.  Denies blood in stool or vomit.  Has tried Tums without significant relief.  Denies chest pain or shortness of breath.  Denies URI symptoms.  Denies fevers.    HPI  Past Medical History:  Diagnosis Date  . Anxiety   . Arthritis    Bilateral knees, wrist  . Asthma    as teenager, none now  pt.denies  . Depression   . GERD (gastroesophageal reflux disease)   . History of carpal tunnel syndrome   . History of seizure    noted on  EEG, trigger by migraines  . Insomnia    resolved  . Low back pain    resolved  . Migraines   . Panniculitis   . PONV (postoperative nausea and vomiting)   . Thiamine deficiency   . Toe fracture, left 12/26/2015  . Vitamin B 12 deficiency   . Vitamin D deficiency    pt unaware    Patient Active Problem List   Diagnosis Date Noted  . Elevated LFTs 11/20/2019  . Cervical cancer screening 11/19/2019  . Deficiency anemia 10/18/2018  . Patellofemoral syndrome of both knees 11/09/2016  . Low back pain 08/24/2016  . Visit for screening mammogram 08/23/2016  . Vitamin D deficiency 02/03/2015  . Insomnia 01/18/2014  . Thiamine deficiency 12/24/2013  . Gastric bypass status for obesity 12/20/2013  . GERD (gastroesophageal reflux disease)  09/20/2013  . B12 deficiency 09/20/2013  . Obesity, Class III, BMI 40-49.9 (morbid obesity) s/p lap gleeve gastrectomy 05/10/2013  . Routine general medical examination at a health care facility 07/14/2011    Past Surgical History:  Procedure Laterality Date  . BREAST BIOPSY Left   . BREAST CYST ASPIRATION    . CARPAL TUNNEL RELEASE Right 2016  . CESAREAN SECTION  2001  . CHOLECYSTECTOMY N/A 09/07/2013   Procedure: LAPAROSCOPIC CHOLECYSTECTOMY WITH INTRAOPERATIVE CHOLANGIOGRAM;  Surgeon: Mariella Saa, MD;  Location: [facility];  Service: General;  Laterality: N/A;  . ESOPHAGOGASTRODUODENOSCOPY N/A 09/06/2013   Procedure: ESOPHAGOGASTRODUODENOSCOPY (EGD);  Surgeon: Kandis Cocking, MD;  Location: [facility];  Service: General;  Laterality: N/A;  . ESOPHAGOGASTRODUODENOSCOPY (EGD) WITH PROPOFOL N/A 10/24/2017   Procedure: ESOPHAGOGASTRODUODENOSCOPY (EGD) WITH PROPOFOL;  Surgeon: Ovidio Kin, MD;  Location: [facility];  Service: General;  Laterality: N/A;  . GASTRIC ROUX-EN-Y N/A 09/12/2018   Procedure: LAPAROSCOPIC ROUX-EN-Y GASTRIC BYPASS REVISION FROM GASTRIC SLEEVE WITH UPPER ENDOSCOPY, ERAS Pathway;  Surgeon: Glenna Fellows, MD;  Location: [facility];  Service: General;  Laterality: N/A;  . LAPAROSCOPIC GASTRIC SLEEVE RESECTION N/A 08/08/2013   Procedure: LAPAROSCOPIC GASTRIC SLEEVE RESECTION UPPER ENDOSCOPY;  Surgeon: Mariella Saa, MD;  Location: [facility];  Service: General;  Laterality: N/A;  . TUBAL LIGATION  2001  . UPPER GI ENDOSCOPY  08/08/2013  Procedure: UPPER GI ENDOSCOPY;  Surgeon: Mariella Saa, MD;  Location: [facility];  Service: General;;    OB History   No obstetric history on file.      Home Medications    Prior to Admission medications   Medication Sig Start Date End Date Taking? Authorizing Provider  alum & mag hydroxide-simeth (MAALOX PLUS) 400-400-40 MG/5ML suspension Take 15 mLs by mouth every 6 (six) hours as needed for indigestion. 04/07/20    Yitzel Shasteen C, PA-C  pantoprazole (PROTONIX) 20 MG tablet Take 2 tablets (40 mg total) by mouth daily. 04/07/20 05/07/20  Sigfredo Schreier, Junius Creamer, PA-C    Family History Family History  Problem Relation Age of Onset  . Diabetes Mother   . Hypertension Mother   . Alcohol abuse Father   . Asthma Sister     Social History Social History   Tobacco Use  . Smoking status: Never Smoker  . Smokeless tobacco: Never Used  Vaping Use  . Vaping Use: Never used  Substance Use Topics  . Alcohol use: No  . Drug use: No     Allergies   Gabapentin, Morphine and related, Amoxicillin, Latex, and Dilaudid [hydromorphone]   Review of Systems Review of Systems  Constitutional: Negative for activity change, appetite change, chills, fatigue and fever.  HENT: Negative for congestion, ear pain, rhinorrhea, sinus pressure, sore throat and trouble swallowing.   Eyes: Negative for discharge and redness.  Respiratory: Negative for cough, chest tightness and shortness of breath.   Cardiovascular: Negative for chest pain.  Gastrointestinal: Positive for abdominal pain. Negative for diarrhea, nausea and vomiting.  Musculoskeletal: Negative for myalgias.  Skin: Negative for rash.  Neurological: Negative for dizziness, light-headedness and headaches.     Physical Exam Triage Vital Signs ED Triage Vitals  Enc Vitals Group     BP 04/07/20 1417 121/77     Pulse Rate 04/07/20 1417 76     Resp 04/07/20 1417 18     Temp 04/07/20 1417 98 F (36.7 C)     Temp Source 04/07/20 1417 Oral     SpO2 04/07/20 1417 99 %     Weight --      Height --      Head Circumference --      Peak Flow --      Pain Score 04/07/20 1418 7     Pain Loc --      Pain Edu? --      Excl. in GC? --    No data found.  Updated Vital Signs BP 121/77 (BP Location: Left Arm)   Pulse 76   Temp 98 F (36.7 C) (Oral)   Resp 18   LMP 03/18/2020   SpO2 99%   Visual Acuity Right Eye Distance:   Left Eye Distance:     Bilateral Distance:    Right Eye Near:   Left Eye Near:    Bilateral Near:     Physical Exam Vitals and nursing note reviewed.  Constitutional:      Appearance: She is well-developed.     Comments: No acute distress  HENT:     Head: Normocephalic and atraumatic.     Nose: Nose normal.     Mouth/Throat:     Comments: Oral mucosa pink and moist, no tonsillar enlargement or exudate. Posterior pharynx patent and nonerythematous, no uvula deviation or swelling. Normal phonation.  Eyes:     Conjunctiva/sclera: Conjunctivae normal.  Cardiovascular:     Rate and Rhythm: Normal rate.  Pulmonary:     Effort: Pulmonary effort is normal. No respiratory distress.     Comments: Breathing comfortably at rest, CTABL, no wheezing, rales or other adventitious sounds auscultated Abdominal:     General: There is no distension.     Comments: Generalized tenderness throughout entire abdomen, more focal nontender to epigastrium, negative rebound  Musculoskeletal:        General: Normal range of motion.     Cervical back: Neck supple.  Skin:    General: Skin is warm and dry.  Neurological:     Mental Status: She is alert and oriented to person, place, and time.      UC Treatments / Results  Labs (all labs ordered are listed, but only abnormal results are displayed) Labs Reviewed  COMPREHENSIVE METABOLIC PANEL - Abnormal; Notable for the following components:      Result Value   ALT 38 (*)    All other components within normal limits   Narrative:    Performed at:  [address], [place], [place]  [id number] Lab Director: Jolene Schimke MD, Phone:  [phone]  CBC   Narrative:    Performed at:  [address] [place], [place]  [id number] Lab Director: Jolene Schimke MD, Phone:  [phone]  LIPASE   Narrative:    Performed at:  [address] [place], [place]  [id number] Lab Director: Jolene Schimke MD, Phone:   [phone]    EKG   Radiology No results found.  Procedures Procedures (including critical care time)  Medications Ordered in UC Medications  alum & mag hydroxide-simeth (MAALOX/MYLANTA) 200-200-20 MG/5ML suspension 30 mL (30 mLs Oral Given 04/07/20 1516)    And  lidocaine (XYLOCAINE) 2 % viscous mouth solution 15 mL (15 mLs Oral Given 04/07/20 1515)    Initial Impression / Assessment and Plan / UC Course  I have reviewed the triage vital signs and the nursing notes.  Pertinent labs & imaging results that were available during my care of the patient were reviewed by me and considered in my medical decision making (see chart for details).     GI cocktail provided which did significantly improve pain.  Suspect likely underlying GERD/gastritis.  Will reinitiate on PPI and may supplement with Pepcid or Maalox as needed for more immediate relief.  Follow-up with gastroenterology/PCP if symptoms becoming recurrent or persistent.  Do not suspect abdominal emergency at this time.  Checking basic labs to ensure stable.  CBC, CMP and lipase pending.  Will call if altering plan.  Discussed strict return precautions. Patient verbalized understanding and is agreeable with plan.  Final Clinical Impressions(s) / UC Diagnoses   Final diagnoses:  Epigastric pain     Discharge Instructions     Begin protonix daily for the next 2 weeks for prevention Maalox as needed for pain for more immediate relief or over the counter Pepcid Follow up with primary care I will call if blood work abnormal    ED Prescriptions    Medication Sig Dispense Auth. Provider   pantoprazole (PROTONIX) 20 MG tablet Take 2 tablets (40 mg total) by mouth daily. 60 tablet Iisha Soyars C, PA-C   alum & mag hydroxide-simeth (MAALOX PLUS) 400-400-40 MG/5ML suspension Take 15 mLs by mouth every 6 (six) hours as needed for indigestion. 355 mL Rodel Glaspy, [place] C, PA-C     PDMP not reviewed this encounter.   Lew Dawes, [place] 04/08/20 [phone]

## 2020-04-07 NOTE — ED Triage Notes (Signed)
Pt c/o center epigastric pain since last Thursday that has worsen over time. States able to eat and drink ok, having normal BMs. States its a stabbing constant pain. Took OTC with no relief.

## 2020-04-07 NOTE — Discharge Instructions (Addendum)
Begin protonix daily for the next 2 weeks for prevention Maalox as needed for pain for more immediate relief or over the counter Pepcid Follow up with primary care I will call if blood work abnormal

## 2020-04-08 LAB — CBC
Hematocrit: 37.5 % (ref 34.0–46.6)
Hemoglobin: 12.1 g/dL (ref 11.1–15.9)
MCH: 27.2 pg (ref 26.6–33.0)
MCHC: 32.3 g/dL (ref 31.5–35.7)
MCV: 84 fL (ref 79–97)
Platelets: 328 10*3/uL (ref 150–450)
RBC: 4.45 x10E6/uL (ref 3.77–5.28)
RDW: 13 % (ref 11.7–15.4)
WBC: 5.4 10*3/uL (ref 3.4–10.8)

## 2020-04-08 LAB — COMPREHENSIVE METABOLIC PANEL
ALT: 38 IU/L — ABNORMAL HIGH (ref 0–32)
AST: 29 IU/L (ref 0–40)
Albumin/Globulin Ratio: 1.7 (ref 1.2–2.2)
Albumin: 4.3 g/dL (ref 3.8–4.8)
Alkaline Phosphatase: 87 IU/L (ref 48–121)
BUN/Creatinine Ratio: 17 (ref 9–23)
BUN: 11 mg/dL (ref 6–24)
Bilirubin Total: 0.7 mg/dL (ref 0.0–1.2)
CO2: 23 mmol/L (ref 20–29)
Calcium: 9.2 mg/dL (ref 8.7–10.2)
Chloride: 104 mmol/L (ref 96–106)
Creatinine, Ser: 0.65 mg/dL (ref 0.57–1.00)
GFR calc Af Amer: 126 mL/min/{1.73_m2} (ref >59)
GFR calc non Af Amer: 109 mL/min/{1.73_m2} (ref >59)
Globulin, Total: 2.5 g/dL (ref 1.5–4.5)
Glucose: 79 mg/dL (ref 65–99)
Potassium: 4.3 mmol/L (ref 3.5–5.2)
Sodium: 140 mmol/L (ref 134–144)
Total Protein: 6.8 g/dL (ref 6.0–8.5)

## 2020-04-08 LAB — LIPASE: Lipase: 70 U/L (ref 14–72)

## 2020-04-09 ENCOUNTER — Ambulatory Visit: Payer: BC Managed Care – PPO | Admitting: Internal Medicine

## 2020-04-09 ENCOUNTER — Encounter: Payer: Self-pay | Admitting: Internal Medicine

## 2020-05-21 ENCOUNTER — Encounter: Payer: Self-pay | Admitting: Internal Medicine

## 2020-06-04 ENCOUNTER — Ambulatory Visit (INDEPENDENT_AMBULATORY_CARE_PROVIDER_SITE_OTHER): Payer: BC Managed Care – PPO

## 2020-06-04 ENCOUNTER — Other Ambulatory Visit: Payer: Self-pay

## 2020-06-04 DIAGNOSIS — Z23 Encounter for immunization: Secondary | ICD-10-CM

## 2020-08-27 ENCOUNTER — Encounter: Payer: Self-pay | Admitting: Internal Medicine

## 2020-10-09 ENCOUNTER — Other Ambulatory Visit: Payer: Self-pay

## 2020-10-09 ENCOUNTER — Ambulatory Visit (INDEPENDENT_AMBULATORY_CARE_PROVIDER_SITE_OTHER): Payer: BC Managed Care – PPO | Admitting: Internal Medicine

## 2020-10-09 ENCOUNTER — Encounter: Payer: Self-pay | Admitting: Internal Medicine

## 2020-10-09 VITALS — BP 122/74 | HR 63 | Temp 98.4°F | Resp 18 | Ht 64.0 in | Wt 134.6 lb

## 2020-10-09 DIAGNOSIS — N6310 Unspecified lump in the right breast, unspecified quadrant: Secondary | ICD-10-CM

## 2020-10-09 MED ORDER — CEPHALEXIN 500 MG PO CAPS: 500.0000 mg | ORAL_CAPSULE | Freq: Two times a day (BID) | ORAL | 0 refills | Status: AC

## 2020-10-09 NOTE — Patient Instructions (Signed)
We have sent in keflex to take 1 pill twice a day for 1 week.  We will get you in at the breast center for the ultrasound of the breast and drainage of the cyst at the same time.

## 2020-10-09 NOTE — Assessment & Plan Note (Signed)
Ordered US and aspiration for drainage of likely recurrent cyst. Rx keflex to cover for infection 7 day course.

## 2020-10-09 NOTE — Progress Notes (Signed)
   Subjective:   Patient ID: Robyn Bryant, female    DOB: [birth year], 45 y.o.   MRN: [id number]  HPI The patient is a 45 YO female coming in for right breast mass noticed 3 days ago. Has history of cysts in breast which have been drained in the past right breast. Has caffeine consumption but overall stable. Denies fevers or chills. This is very tender. Family history of breast cancer but recent mammogram within a few months normal. Overall feels like it is growing some.  Review of Systems  Constitutional: Negative.   HENT: Negative.   Eyes: Negative.   Respiratory: Negative for cough, chest tightness and shortness of breath.   Cardiovascular: Negative for chest pain, palpitations and leg swelling.  Gastrointestinal: Negative for abdominal distention, abdominal pain, constipation, diarrhea, nausea and vomiting.  Genitourinary:       Breast pain and mass  Musculoskeletal: Negative.   Skin: Negative.   Neurological: Negative.   Psychiatric/Behavioral: Negative.     Objective:  Physical Exam Constitutional:      Appearance: She is well-developed and well-nourished.  HENT:     Head: Normocephalic and atraumatic.  Eyes:     Extraocular Movements: EOM normal.  Cardiovascular:     Rate and Rhythm: Normal rate and regular rhythm.     Comments: Right breast with swelling and heat 9 oclock with likely cyst palpable, no axillary LAD detected, left breast normal exam, prior scars from skin removal s/p weight loss Pulmonary:     Effort: Pulmonary effort is normal. No respiratory distress.     Breath sounds: Normal breath sounds. No wheezing or rales.  Abdominal:     General: Bowel sounds are normal. There is no distension.     Palpations: Abdomen is soft.     Tenderness: There is no abdominal tenderness. There is no rebound.  Musculoskeletal:        General: No edema.     Cervical back: Normal range of motion.  Skin:    General: Skin is warm and dry.  Neurological:     Mental Status:  She is alert and oriented to person, place, and time.     Coordination: Coordination normal.  Psychiatric:        Mood and Affect: Mood and affect normal.     Vitals:   10/09/20 1346  BP: 122/74  Pulse: 63  Weight: 134 lb 9.6 oz (61.1 kg)  Height: 4\' 11"  (1.499 m)    This visit occurred during the SARS-CoV-2 public health emergency.  Safety protocols were in place, including screening questions prior to the visit, additional usage of staff PPE, and extensive cleaning of exam room while observing appropriate contact time as indicated for disinfecting solutions.   Assessment & Plan:

## 2020-10-10 ENCOUNTER — Other Ambulatory Visit: Payer: Self-pay | Admitting: Internal Medicine

## 2020-10-10 ENCOUNTER — Encounter: Payer: Self-pay | Admitting: Internal Medicine

## 2020-10-10 ENCOUNTER — Ambulatory Visit: Payer: BC Managed Care – PPO | Admitting: Internal Medicine

## 2020-10-10 ENCOUNTER — Ambulatory Visit
Admission: RE | Admit: 2020-10-10 | Discharge: 2020-10-10 | Disposition: A | Discharge status: Home / Self Care | Payer: BC Managed Care – PPO | Source: Ambulatory Visit | Attending: Internal Medicine | Admitting: Internal Medicine

## 2020-10-10 DIAGNOSIS — N6310 Unspecified lump in the right breast, unspecified quadrant: Secondary | ICD-10-CM

## 2020-10-15 LAB — AEROBIC/ANAEROBIC CULTURE W GRAM STAIN (SURGICAL/DEEP WOUND): Culture: NO GROWTH

## 2020-10-23 ENCOUNTER — Encounter: Payer: Self-pay | Admitting: Internal Medicine

## 2020-10-24 ENCOUNTER — Other Ambulatory Visit: Payer: BC Managed Care – PPO

## 2020-10-24 ENCOUNTER — Telehealth (INDEPENDENT_AMBULATORY_CARE_PROVIDER_SITE_OTHER): Payer: BC Managed Care – PPO | Admitting: Family

## 2020-10-24 ENCOUNTER — Other Ambulatory Visit: Payer: Self-pay | Admitting: Family

## 2020-10-24 DIAGNOSIS — J209 Acute bronchitis, unspecified: Secondary | ICD-10-CM

## 2020-10-24 MED ORDER — AZITHROMYCIN 250 MG PO TABS: ORAL_TABLET | ORAL | 0 refills | Status: DC

## 2020-10-24 MED ORDER — BENZONATATE 100 MG PO CAPS: 100.0000 mg | ORAL_CAPSULE | Freq: Three times a day (TID) | ORAL | 0 refills | Status: DC | PRN

## 2020-10-24 NOTE — Progress Notes (Signed)
Robyn Bryant is a 45 y.o. female with the following history as recorded in [employer]:  Patient Active Problem List   Diagnosis Date Noted  . Breast mass, right 10/09/2020  . Elevated LFTs 11/20/2019  . Cervical cancer screening 11/19/2019  . Deficiency anemia 10/18/2018  . Patellofemoral syndrome of both knees 11/09/2016  . Low back pain 08/24/2016  . Visit for screening mammogram 08/23/2016  . Vitamin D deficiency 02/03/2015  . Insomnia 01/18/2014  . Thiamine deficiency 12/24/2013  . Gastric bypass status for obesity 12/20/2013  . GERD (gastroesophageal reflux disease) 09/20/2013  . B12 deficiency 09/20/2013  . Obesity, Class III, BMI 40-49.9 (morbid obesity) s/p lap gleeve gastrectomy 05/10/2013  . Routine general medical examination at a health care facility 07/14/2011    Current Outpatient Medications  Medication Sig Dispense Refill  . azithromycin (ZITHROMAX) 250 MG tablet 2 tabs po qd x 1 day; 1 tablet per day x 4 days; 6 tablet 0  . benzonatate (TESSALON) 100 MG capsule Take 1 capsule (100 mg total) by mouth 3 (three) times daily as needed. 20 capsule 0   No current facility-administered medications for this visit.    Allergies: Gabapentin, Morphine and related, Amoxicillin, Latex, and Dilaudid [hydromorphone]  Past Medical History:  Diagnosis Date  . Anxiety   . Arthritis    Bilateral knees, wrist  . Asthma    as teenager, none now  pt.denies  . Depression   . GERD (gastroesophageal reflux disease)   . History of carpal tunnel syndrome   . History of seizure    noted on  EEG, trigger by migraines  . Insomnia    resolved  . Low back pain    resolved  . Migraines   . Panniculitis   . PONV (postoperative nausea and vomiting)   . Thiamine deficiency   . Toe fracture, left 12/26/2015  . Vitamin B 12 deficiency   . Vitamin D deficiency    pt unaware    Past Surgical History:  Procedure Laterality Date  . BREAST BIOPSY Left   . BREAST CYST ASPIRATION    .  CARPAL TUNNEL RELEASE Right 2016  . CESAREAN SECTION  2001  . CHOLECYSTECTOMY N/A 09/07/2013   Procedure: LAPAROSCOPIC CHOLECYSTECTOMY WITH INTRAOPERATIVE CHOLANGIOGRAM;  Surgeon: Mariella Saa, MD;  Location: [facility];  Service: General;  Laterality: N/A;  . ESOPHAGOGASTRODUODENOSCOPY N/A 09/06/2013   Procedure: ESOPHAGOGASTRODUODENOSCOPY (EGD);  Surgeon: Kandis Cocking, MD;  Location: [facility];  Service: General;  Laterality: N/A;  . ESOPHAGOGASTRODUODENOSCOPY (EGD) WITH PROPOFOL N/A 10/24/2017   Procedure: ESOPHAGOGASTRODUODENOSCOPY (EGD) WITH PROPOFOL;  Surgeon: Ovidio Kin, MD;  Location: [facility];  Service: General;  Laterality: N/A;  . GASTRIC ROUX-EN-Y N/A 09/12/2018   Procedure: LAPAROSCOPIC ROUX-EN-Y GASTRIC BYPASS REVISION FROM GASTRIC SLEEVE WITH UPPER ENDOSCOPY, ERAS Pathway;  Surgeon: Glenna Fellows, MD;  Location: [facility];  Service: General;  Laterality: N/A;  . LAPAROSCOPIC GASTRIC SLEEVE RESECTION N/A 08/08/2013   Procedure: LAPAROSCOPIC GASTRIC SLEEVE RESECTION UPPER ENDOSCOPY;  Surgeon: Mariella Saa, MD;  Location: [facility];  Service: General;  Laterality: N/A;  . TUBAL LIGATION  2001  . UPPER GI ENDOSCOPY  08/08/2013   Procedure: UPPER GI ENDOSCOPY;  Surgeon: Mariella Saa, MD;  Location: [facility];  Service: General;;    Family History  Problem Relation Age of Onset  . Diabetes Mother   . Hypertension Mother   . Alcohol abuse Father   . Asthma Sister     Social History   Tobacco  Use  . Smoking status: Never Smoker  . Smokeless tobacco: Never Used  Substance Use Topics  . Alcohol use: No    Subjective:    I connected with [name] on 10/24/20 at 10:20 AM EDT by a video enabled telemedicine application and verified that I am speaking with the correct person using two identifiers.   I discussed the limitations of evaluation and management by telemedicine and the availability of in person appointments. The patient expressed understanding  and agreed to proceed. Provider in office/ patient is at home; provider and patient are only 2 people on video call.   1 week history of cough/ congestion; + headache, productive cough; denies any fever or difficulty breathing; took COVID test last week and again yesterday- both were negative; using OTC Dayquil and Nyquil;    Objective:  There were no vitals filed for this visit.  General: Well developed, well nourished, in no acute distress  Skin : Warm and dry.  Head: Normocephalic and atraumatic  Lungs: Respirations unlabored;  Neurologic: Alert and oriented; speech intact; face symmetrical;   Assessment:  1. Acute bronchitis, unspecified organism     Plan:  Rx for Z-pak and Tessalon Perles; increase fluids, rest; work note given as requested; follow-up worse, no better.   No follow-ups on file.  No orders of the defined types were placed in this encounter.   Requested Prescriptions   Signed Prescriptions Disp Refills  . azithromycin (ZITHROMAX) 250 MG tablet 6 tablet 0    Sig: 2 tabs po qd x 1 day; 1 tablet per day x 4 days;  . benzonatate (TESSALON) 100 MG capsule 20 capsule 0    Sig: Take 1 capsule (100 mg total) by mouth 3 (three) times daily as needed.

## 2020-10-29 ENCOUNTER — Ambulatory Visit
Admission: RE | Admit: 2020-10-29 | Discharge: 2020-10-29 | Disposition: A | Discharge status: Home / Self Care | Payer: BC Managed Care – PPO | Source: Ambulatory Visit | Attending: Internal Medicine | Admitting: Internal Medicine

## 2020-10-29 ENCOUNTER — Other Ambulatory Visit: Payer: Self-pay

## 2020-10-29 DIAGNOSIS — N6310 Unspecified lump in the right breast, unspecified quadrant: Secondary | ICD-10-CM

## 2020-12-24 ENCOUNTER — Encounter: Payer: Self-pay | Admitting: Internal Medicine

## 2020-12-26 ENCOUNTER — Other Ambulatory Visit: Payer: Self-pay

## 2020-12-26 ENCOUNTER — Telehealth (INDEPENDENT_AMBULATORY_CARE_PROVIDER_SITE_OTHER): Payer: BC Managed Care – PPO | Admitting: Nurse Practitioner

## 2020-12-26 DIAGNOSIS — U071 COVID-19: Secondary | ICD-10-CM | POA: Diagnosis not present

## 2020-12-26 MED ORDER — FLUTICASONE PROPIONATE 50 MCG/ACT NA SUSP: 2.0000 | Freq: Every day | NASAL | 0 refills | Status: DC

## 2020-12-26 MED ORDER — CETIRIZINE HCL 10 MG PO TABS: 10.0000 mg | ORAL_TABLET | Freq: Every day | ORAL | 0 refills | Status: DC

## 2020-12-26 NOTE — Patient Instructions (Signed)
Covid 19 Post nasal drip:   Stay well hydrated  Stay active  Deep breathing exercises  May start vitamin C 2,000 mg daily, vitamin D3 2,000 IU daily, Zinc 220 mg daily  May take tylenol or fever or pain  May take mucinex DM twice daily  May try magnesium 600 mg daily for headache   Will order antihistamine  Will order flonase    Follow up:  Follow up in 2 weeks or sooner if needed

## 2020-12-26 NOTE — Progress Notes (Signed)
Virtual Visit via Telephone Note  I connected with Robyn Bryant on 12/26/20 at 10:30 AM EDT by telephone and verified that I am speaking with the correct person using two identifiers.  Location: Patient: home Provider: office   I discussed the limitations, risks, security and privacy concerns of performing an evaluation and management service by telephone and the availability of in person appointments. I also discussed with the patient that there may be a patient responsible charge related to this service. The patient expressed understanding and agreed to proceed.   History of Present Illness:  Patient presents today for post-COVID care clinic visit.  Patient states that she tested positive for COVID this past Sunday.  She states that overall she has been doing well.  She states that her only complaint at this point is postnasal drip and headache.  She has been taking Tylenol for her headache.  We discussed supportive measures.  I will order Flonase and Zyrtec for her.  Patient is fully vaccinated.  This is her second time having COVID. Denies f/c/s, n/v/d, hemoptysis, PND, chest pain or edema.      Observations/Objective:  Vitals with BMI 10/09/2020 04/07/2020 02/20/2020  Height 5\' 4"  - 5\' 4"   Weight 134 lbs 10 oz - 134 lbs  BMI 23.09 - 22.99  Systolic 122 121 92  Diastolic 74 77 64  Pulse 63 76 64      Assessment and Plan:  Covid 19 Post nasal drip:   Stay well hydrated  Stay active  Deep breathing exercises  May start vitamin C 2,000 mg daily, vitamin D3 2,000 IU daily, Zinc 220 mg daily  May take tylenol or fever or pain  May take mucinex DM twice daily  May try magnesium 600 mg daily for headache   Will order antihistamine  Will order flonase    Follow up:  Follow up in 2 weeks or sooner if needed     I discussed the assessment and treatment plan with the patient. The patient was provided an opportunity to ask questions and all were answered. The  patient agreed with the plan and demonstrated an understanding of the instructions.   The patient was advised to call back or seek an in-person evaluation if the symptoms worsen or if the condition fails to improve as anticipated.  I provided 23 minutes of non-face-to-face time during this encounter.   , NP

## 2021-01-18 ENCOUNTER — Other Ambulatory Visit: Payer: Self-pay | Admitting: Nurse Practitioner

## 2021-03-18 ENCOUNTER — Encounter: Payer: Self-pay | Admitting: Adult Health

## 2021-03-18 ENCOUNTER — Telehealth (INDEPENDENT_AMBULATORY_CARE_PROVIDER_SITE_OTHER): Payer: BC Managed Care – PPO | Admitting: Adult Health

## 2021-03-18 DIAGNOSIS — H1013 Acute atopic conjunctivitis, bilateral: Secondary | ICD-10-CM

## 2021-03-18 NOTE — Progress Notes (Signed)
Virtual Visit via Video Note  I connected with Robyn Bryant on  03/18/21 at  4:00 PM EDT by a video enabled telemedicine application and verified that I am speaking with the correct person using two identifiers.  Location patient: home Location provider:work or home office Persons participating in the virtual visit: patient, provider  I discussed the limitations of evaluation and management by telemedicine and the availability of in person appointments. The patient expressed understanding and agreed to proceed.   HPI: D65-year-old female who is being evaluated today for an acute issue.  Her symptoms started this morning have improved to some degree throughout the day.  Symptoms include bilateral itching/burning/gritty feeling in her eyes.  She reports that she woke up this morning and she had swelling under her eyes, her eyes were "bloodshot" and they were burning.  As the day has progressed the swelling has started to improve, no longer has red eyes but they do continue to burn, feel itchy, and feels as though she has sand in them.  Denies any drainage, trauma, or working around any chemicals that could cause irritation.  Has not experienced runny nose, sinus pain or pressure, or other allergy symptoms.  No history of seasonal allergies.  Has not used any over-the-counter medications   ROS: See pertinent positives and negatives per HPI.  Past Medical History:  Diagnosis Date   Anxiety    Arthritis    Bilateral knees, wrist   Asthma    as teenager, none now  pt.denies   Depression    GERD (gastroesophageal reflux disease)    History of carpal tunnel syndrome    History of seizure    noted on  EEG, trigger by migraines   Insomnia    resolved   Low back pain    resolved   Migraines    Panniculitis    PONV (postoperative nausea and vomiting)    Thiamine deficiency    Toe fracture, left 12/26/2015   Vitamin B 12 deficiency    Vitamin D deficiency    pt unaware    Past Surgical  History:  Procedure Laterality Date   BREAST BIOPSY Left    BREAST CYST ASPIRATION     CARPAL TUNNEL RELEASE Right 2016   CESAREAN SECTION  2001   CHOLECYSTECTOMY N/A 09/07/2013   Procedure: LAPAROSCOPIC CHOLECYSTECTOMY WITH INTRAOPERATIVE CHOLANGIOGRAM;  Surgeon: Mariella Saa, MD;  Location: [facility];  Service: General;  Laterality: N/A;   ESOPHAGOGASTRODUODENOSCOPY N/A 09/06/2013   Procedure: ESOPHAGOGASTRODUODENOSCOPY (EGD);  Surgeon: Kandis Cocking, MD;  Location: [facility];  Service: General;  Laterality: N/A;   ESOPHAGOGASTRODUODENOSCOPY (EGD) WITH PROPOFOL N/A 10/24/2017   Procedure: ESOPHAGOGASTRODUODENOSCOPY (EGD) WITH PROPOFOL;  Surgeon: Ovidio Kin, MD;  Location: [facility];  Service: General;  Laterality: N/A;   GASTRIC ROUX-EN-Y N/A 09/12/2018   Procedure: LAPAROSCOPIC ROUX-EN-Y GASTRIC BYPASS REVISION FROM GASTRIC SLEEVE WITH UPPER ENDOSCOPY, ERAS Pathway;  Surgeon: Glenna Fellows, MD;  Location: [facility];  Service: General;  Laterality: N/A;   LAPAROSCOPIC GASTRIC SLEEVE RESECTION N/A 08/08/2013   Procedure: LAPAROSCOPIC GASTRIC SLEEVE RESECTION UPPER ENDOSCOPY;  Surgeon: Mariella Saa, MD;  Location: [facility];  Service: General;  Laterality: N/A;   TUBAL LIGATION  2001   UPPER GI ENDOSCOPY  08/08/2013   Procedure: UPPER GI ENDOSCOPY;  Surgeon: Mariella Saa, MD;  Location: [facility];  Service: General;;    Family History  Problem Relation Age of Onset   Diabetes Mother    Hypertension Mother  Alcohol abuse Father    Asthma Sister        Current Outpatient Medications:    fluticasone (FLONASE) 50 MCG/ACT nasal spray, SPRAY 2 SPRAYS INTO EACH NOSTRIL EVERY DAY (Patient not taking: Reported on 03/18/2021), Disp: 16 mL, Rfl: 0  EXAM:  VITALS per patient if applicable:  GENERAL: alert, oriented, appears well and in no acute distress  HEENT: atraumatic, conjunttiva clear, no obvious abnormalities on inspection of external nose and ears. Trace edema  to bilateral under eyes   NECK: normal movements of the head and neck  LUNGS: on inspection no signs of respiratory distress, breathing rate appears normal, no obvious gross SOB, gasping or wheezing  CV: no obvious cyanosis  MS: moves all visible extremities without noticeable abnormality  PSYCH/NEURO: pleasant and cooperative, no obvious depression or anxiety, speech and thought processing grossly intact  ASSESSMENT AND PLAN:  Discussed the following assessment and plan: 1. Allergic conjunctivitis of both eyes -Allergic conjunctivitis versus dry eye syndrome.  Advised over-the-counter lubricating eyedrops for relief.  Can follow-up with PCP or her eye doctor if symptoms continue.      I discussed the assessment and treatment plan with the patient. The patient was provided an opportunity to ask questions and all were answered. The patient agreed with the plan and demonstrated an understanding of the instructions.   The patient was advised to call back or seek an in-person evaluation if the symptoms worsen or if the condition fails to improve as anticipated.   Shirline Frees, NP

## 2021-04-02 ENCOUNTER — Encounter (HOSPITAL_COMMUNITY): Payer: Self-pay | Admitting: *Deleted

## 2021-04-09 ENCOUNTER — Encounter: Payer: Self-pay | Admitting: Internal Medicine

## 2021-04-09 ENCOUNTER — Other Ambulatory Visit: Payer: Self-pay

## 2021-04-09 ENCOUNTER — Ambulatory Visit (INDEPENDENT_AMBULATORY_CARE_PROVIDER_SITE_OTHER): Payer: 59 | Admitting: Internal Medicine

## 2021-04-09 VITALS — BP 110/60 | HR 77 | Temp 98.2°F | Ht 64.0 in | Wt 139.0 lb

## 2021-04-09 DIAGNOSIS — R7989 Other specified abnormal findings of blood chemistry: Secondary | ICD-10-CM | POA: Diagnosis not present

## 2021-04-09 DIAGNOSIS — Z9884 Bariatric surgery status: Secondary | ICD-10-CM | POA: Diagnosis not present

## 2021-04-09 DIAGNOSIS — Z0000 Encounter for general adult medical examination without abnormal findings: Secondary | ICD-10-CM

## 2021-04-09 DIAGNOSIS — Z23 Encounter for immunization: Secondary | ICD-10-CM

## 2021-04-09 DIAGNOSIS — E538 Deficiency of other specified B group vitamins: Secondary | ICD-10-CM

## 2021-04-09 DIAGNOSIS — D539 Nutritional anemia, unspecified: Secondary | ICD-10-CM

## 2021-04-09 DIAGNOSIS — K9589 Other complications of other bariatric procedure: Secondary | ICD-10-CM

## 2021-04-09 DIAGNOSIS — D508 Other iron deficiency anemias: Secondary | ICD-10-CM

## 2021-04-09 DIAGNOSIS — E559 Vitamin D deficiency, unspecified: Secondary | ICD-10-CM

## 2021-04-09 DIAGNOSIS — E519 Thiamine deficiency, unspecified: Secondary | ICD-10-CM

## 2021-04-09 LAB — BASIC METABOLIC PANEL
BUN: 9 mg/dL (ref 6–23)
CO2: 28 mEq/L (ref 19–32)
Calcium: 8.5 mg/dL (ref 8.4–10.5)
Chloride: 107 mEq/L (ref 96–112)
Creatinine, Ser: 0.65 mg/dL (ref 0.40–1.20)
GFR: 106.91 mL/min (ref >60.00)
Glucose, Bld: 91 mg/dL (ref 70–99)
Potassium: 4.2 mEq/L (ref 3.5–5.1)
Sodium: 141 mEq/L (ref 135–145)

## 2021-04-09 LAB — CBC WITH DIFFERENTIAL/PLATELET
Basophils Absolute: 0 10*3/uL (ref 0.0–0.1)
Basophils Relative: 0.9 % (ref 0.0–3.0)
Eosinophils Absolute: 0.2 10*3/uL (ref 0.0–0.7)
Eosinophils Relative: 3.1 % (ref 0.0–5.0)
HCT: 31.9 % — ABNORMAL LOW (ref 36.0–46.0)
Hemoglobin: 10.5 g/dL — ABNORMAL LOW (ref 12.0–15.0)
Lymphocytes Relative: 35.4 % (ref 12.0–46.0)
Lymphs Abs: 1.8 10*3/uL (ref 0.7–4.0)
MCHC: 32.9 g/dL (ref 30.0–36.0)
MCV: 77.8 fl — ABNORMAL LOW (ref 78.0–100.0)
Monocytes Absolute: 0.6 10*3/uL (ref 0.1–1.0)
Monocytes Relative: 11.1 % (ref 3.0–12.0)
Neutro Abs: 2.5 10*3/uL (ref 1.4–7.7)
Neutrophils Relative %: 49.5 % (ref 43.0–77.0)
Platelets: 344 10*3/uL (ref 150.0–400.0)
RBC: 4.1 Mil/uL (ref 3.87–5.11)
RDW: 14.9 % (ref 11.5–15.5)
WBC: 5 10*3/uL (ref 4.0–10.5)

## 2021-04-09 LAB — VITAMIN B12: Vitamin B-12: 180 pg/mL — ABNORMAL LOW (ref 211–911)

## 2021-04-09 LAB — HEPATIC FUNCTION PANEL
ALT: 17 U/L (ref 0–35)
AST: 18 U/L (ref 0–37)
Albumin: 3.7 g/dL (ref 3.5–5.2)
Alkaline Phosphatase: 68 U/L (ref 39–117)
Bilirubin, Direct: 0.1 mg/dL (ref 0.0–0.3)
Total Bilirubin: 0.6 mg/dL (ref 0.2–1.2)
Total Protein: 6.5 g/dL (ref 6.0–8.3)

## 2021-04-09 LAB — IBC + FERRITIN
Ferritin: 5.9 ng/mL — ABNORMAL LOW (ref 10.0–291.0)
Iron: 25 ug/dL — ABNORMAL LOW (ref 42–145)
Saturation Ratios: 5.1 % — ABNORMAL LOW (ref 20.0–50.0)
TIBC: 492.8 ug/dL — ABNORMAL HIGH (ref 250.0–450.0)
Transferrin: 352 mg/dL (ref 212.0–360.0)

## 2021-04-09 LAB — LIPID PANEL
Cholesterol: 126 mg/dL (ref 0–200)
HDL: 62.5 mg/dL (ref >39.00)
LDL Cholesterol: 51 mg/dL (ref 0–99)
NonHDL: 63.76
Total CHOL/HDL Ratio: 2
Triglycerides: 64 mg/dL (ref 0.0–149.0)
VLDL: 12.8 mg/dL (ref 0.0–40.0)

## 2021-04-09 LAB — VITAMIN D 25 HYDROXY (VIT D DEFICIENCY, FRACTURES): VITD: 23.48 ng/mL — ABNORMAL LOW (ref 30.00–100.00)

## 2021-04-09 LAB — FOLATE: Folate: 9 ng/mL (ref >5.9)

## 2021-04-09 MED ORDER — ACCRUFER 30 MG PO CAPS
1.0000 | ORAL_CAPSULE | Freq: Two times a day (BID) | ORAL | 1 refills | Status: DC
Start: 2021-04-09 — End: 2021-04-10

## 2021-04-09 NOTE — Progress Notes (Signed)
Subjective:  Patient ID: Robyn Bryant, female    DOB: 1975-10-12  Age: 45 y.o. MRN: [id number]  CC: Annual Exam and Anemia  This visit occurred during the SARS-CoV-2 public health emergency.  Safety protocols were in place, including screening questions prior to the visit, additional usage of staff PPE, and extensive cleaning of exam room while observing appropriate contact time as indicated for disinfecting solutions.    HPI Robyn Bryant presents for a CPX and f/up -  She is post bariatric surgery and tells me she is taking a multivitamin supplement but no dedicated vitamins.  She has a mass in her right breast that is being followed.  She otherwise feels well and offers no complaints.  Outpatient Medications Prior to Visit  Medication Sig Dispense Refill   fluticasone (FLONASE) 50 MCG/ACT nasal spray SPRAY 2 SPRAYS INTO EACH NOSTRIL EVERY DAY 16 mL 0   No facility-administered medications prior to visit.    ROS Review of Systems  Constitutional:  Negative for appetite change, diaphoresis, fatigue and unexpected weight change.  HENT: Negative.    Eyes: Negative.   Respiratory:  Negative for cough, chest tightness and wheezing.   Cardiovascular:  Negative for chest pain, palpitations and leg swelling.  Gastrointestinal:  Negative for abdominal pain, constipation, diarrhea, nausea and vomiting.  Endocrine: Negative.   Genitourinary: Negative.  Negative for difficulty urinating.  Musculoskeletal: Negative.   Skin: Negative.   Neurological:  Negative for dizziness, weakness, light-headedness and headaches.  Hematological:  Negative for adenopathy. Does not bruise/bleed easily.  Psychiatric/Behavioral: Negative.     Objective:  BP 110/60 (BP Location: Left Arm, Patient Position: Sitting, Cuff Size: Large)   Pulse 77   Temp 98.2 F (36.8 C) (Oral)   Ht 5\' 4"  (1.626 m)   Wt 139 lb (63 kg)   SpO2 96%   BMI 23.86 kg/m   BP Readings from Last 3 Encounters:  04/09/21 110/60   10/09/20 122/74  04/07/20 121/77    Wt Readings from Last 3 Encounters:  04/09/21 139 lb (63 kg)  10/09/20 134 lb 9.6 oz (61.1 kg)  02/20/20 134 lb (60.8 kg)    Physical Exam Vitals reviewed.  HENT:     Nose: Nose normal.     Mouth/Throat:     Mouth: Mucous membranes are moist. Mucous membranes are pale.  Eyes:     General: No scleral icterus.    Conjunctiva/sclera: Conjunctivae normal.  Cardiovascular:     Rate and Rhythm: Normal rate and regular rhythm.     Heart sounds: No murmur heard. Pulmonary:     Effort: Pulmonary effort is normal.     Breath sounds: No stridor. No wheezing, rhonchi or rales.  Abdominal:     General: Abdomen is flat.     Palpations: There is no mass.     Tenderness: There is no abdominal tenderness. There is no guarding.     Hernia: No hernia is present.  Musculoskeletal:        General: Normal range of motion.     Cervical back: Neck supple.     Right lower leg: No edema.     Left lower leg: No edema.  Lymphadenopathy:     Cervical: No cervical adenopathy.  Skin:    Coloration: Skin is pale.     Findings: No rash.  Neurological:     General: No focal deficit present.     Mental Status: She is alert.  Psychiatric:  Mood and Affect: Mood normal.    Lab Results  Component Value Date   WBC 5.0 04/09/2021   HGB 10.5 (L) 04/09/2021   HCT 31.9 (L) 04/09/2021   PLT 344.0 04/09/2021   GLUCOSE 91 04/09/2021   CHOL 126 04/09/2021   TRIG 64.0 04/09/2021   HDL 62.50 04/09/2021   LDLCALC 51 04/09/2021   ALT 17 04/09/2021   AST 18 04/09/2021   NA 141 04/09/2021   K 4.2 04/09/2021   CL 107 04/09/2021   CREATININE 0.65 04/09/2021   BUN 9 04/09/2021   CO2 28 04/09/2021   TSH 1.53 05/31/2019   HGBA1C 5.2 05/31/2019    US BREAST LTD UNI RIGHT INC AXILLA  Result Date: 10/29/2020 CLINICAL DATA:  45 year old female for follow-up of RIGHT breast abscess, aspirated and placed on antibiotics 2 weeks ago. The patient states that her pain  and erythema have completely resolved. EXAM: ULTRASOUND OF THE RIGHT BREAST COMPARISON:  Previous exam(s). FINDINGS: On physical exam, no erythema in the RIGHT breast identified. Targeted ultrasound is performed, showing no recurrence of cyst/abscess at the 9:30 position of the RIGHT breast 5 cm from the nipple, at the site of prior aspirated abscess. IMPRESSION: No residual abscess in the OUTER RIGHT breast. RECOMMENDATION: Bilateral screening mammogram in September 2022 to resume annual mammogram schedule I have discussed the findings and recommendations with the patient. If applicable, a reminder letter will be sent to the patient regarding the next appointment. BI-RADS CATEGORY  1: Negative. Electronically Signed   By: Harmon Pier M.D.   On: 10/29/2020 15:12    Assessment & Plan:   Christinea was seen today for annual exam and anemia.  Diagnoses and all orders for this visit:  B12 deficiency- Her B12 level is low.  I recommended that she start parenteral B12 replacement therapy. -     CBC with Differential/Platelet; Future -     Folate; Future -     Folate -     CBC with Differential/Platelet  Deficiency anemia- I will screen her for vitamin deficiencies. -     CBC with Differential/Platelet; Future -     IBC + Ferritin; Future -     Zinc; Future -     Vitamin B1; Future -     Folate; Future -     Folate -     Vitamin B1 -     Zinc -     IBC + Ferritin -     CBC with Differential/Platelet  Elevated LFTs- Her liver enzymes are normal now. -     Hepatic function panel; Future -     Hepatic function panel  Gastric bypass status for obesity- I will screen her for vitamin deficient disease. -     CBC with Differential/Platelet; Future -     Basic metabolic panel; Future -     Vitamin B12; Future -     IBC + Ferritin; Future -     Zinc; Future -     VITAMIN D 25 Hydroxy (Vit-D Deficiency, Fractures); Future -     Vitamin B1; Future -     Folate; Future -     Folate -     Vitamin  B1 -     VITAMIN D 25 Hydroxy (Vit-D Deficiency, Fractures) -     Zinc -     IBC + Ferritin -     Vitamin B12 -     Basic metabolic panel -     CBC  with Differential/Platelet  Thiamine deficiency -     Vitamin B1; Future -     Vitamin B1  Routine general medical examination at a health care facility- Exam completed, labs reviewed, vaccines reviewed and updated, cancer screenings are up-to-date, patient education was given. -     Lipid panel; Future -     Lipid panel  Vitamin D deficiency -     VITAMIN D 25 Hydroxy (Vit-D Deficiency, Fractures); Future -     VITAMIN D 25 Hydroxy (Vit-D Deficiency, Fractures)  Iron deficiency anemia following bariatric surgery- I recommended that she start oral iron replacement therapy. -     Discontinue: Ferric Maltol (ACCRUFER) 30 MG CAPS; Take 1 capsule by mouth in the morning and at bedtime.  Other orders -     Flu Vaccine QUAD 6+ mos PF IM (Fluarix Quad PF) -     Tdap vaccine greater than or equal to 7yo IM  I have discontinued Shelie Caicedo's fluticasone.  Meds ordered this encounter  Medications   DISCONTD: Ferric Maltol (ACCRUFER) 30 MG CAPS    Sig: Take 1 capsule by mouth in the morning and at bedtime.    Dispense:  180 capsule    Refill:  1      Follow-up: Return in about 3 months (around 07/09/2021).  Sanda Linger, MD

## 2021-04-09 NOTE — Patient Instructions (Signed)
Health Maintenance, Female Adopting a healthy lifestyle and getting preventive care are important in promoting health and wellness. Ask your health care provider about: The right schedule for you to have regular tests and exams. Things you can do on your own to prevent diseases and keep yourself healthy. What should I know about diet, weight, and exercise? Eat a healthy diet  Eat a diet that includes plenty of vegetables, fruits, low-fat dairy products, and lean protein. Do not eat a lot of foods that are high in solid fats, added sugars, or sodium. Maintain a healthy weight Body mass index (BMI) is used to identify weight problems. It estimates body fat based on height and weight. Your health care provider can help determine your BMI and help you achieve or maintain a healthy weight. Get regular exercise Get regular exercise. This is one of the most important things you can do for your health. Most adults should: Exercise for at least 150 minutes each week. The exercise should increase your heart rate and make you sweat (moderate-intensity exercise). Do strengthening exercises at least twice a week. This is in addition to the moderate-intensity exercise. Spend less time sitting. Even light physical activity can be beneficial. Watch cholesterol and blood lipids Have your blood tested for lipids and cholesterol at 44 years of age, then have this test every 5 years. Have your cholesterol levels checked more often if: Your lipid or cholesterol levels are high. You are older than 44 years of age. You are at high risk for heart disease. What should I know about cancer screening? Depending on your health history and family history, you may need to have cancer screening at various ages. This may include screening for: Breast cancer. Cervical cancer. Colorectal cancer. Skin cancer. Lung cancer. What should I know about heart disease, diabetes, and high blood pressure? Blood pressure and heart  disease High blood pressure causes heart disease and increases the risk of stroke. This is more likely to develop in people who have high blood pressure readings, are of African descent, or are overweight. Have your blood pressure checked: Every 3-5 years if you are 18-39 years of age. Every year if you are 40 years old or older. Diabetes Have regular diabetes screenings. This checks your fasting blood sugar level. Have the screening done: Once every three years after age 40 if you are at a normal weight and have a low risk for diabetes. More often and at a younger age if you are overweight or have a high risk for diabetes. What should I know about preventing infection? Hepatitis B If you have a higher risk for hepatitis B, you should be screened for this virus. Talk with your health care provider to find out if you are at risk for hepatitis B infection. Hepatitis C Testing is recommended for: Everyone born from 1945 through 1965. Anyone with known risk factors for hepatitis C. Sexually transmitted infections (STIs) Get screened for STIs, including gonorrhea and chlamydia, if: You are sexually active and are younger than 44 years of age. You are older than 44 years of age and your health care provider tells you that you are at risk for this type of infection. Your sexual activity has changed since you were last screened, and you are at increased risk for chlamydia or gonorrhea. Ask your health care provider if you are at risk. Ask your health care provider about whether you are at high risk for HIV. Your health care provider may recommend a prescription medicine   to help prevent HIV infection. If you choose to take medicine to prevent HIV, you should first get tested for HIV. You should then be tested every 3 months for as long as you are taking the medicine. Pregnancy If you are about to stop having your period (premenopausal) and you may become pregnant, seek counseling before you get  pregnant. Take 400 to 800 micrograms (mcg) of folic acid every day if you become pregnant. Ask for birth control (contraception) if you want to prevent pregnancy. Osteoporosis and menopause Osteoporosis is a disease in which the bones lose minerals and strength with aging. This can result in bone fractures. If you are 65 years old or older, or if you are at risk for osteoporosis and fractures, ask your health care provider if you should: Be screened for bone loss. Take a calcium or vitamin D supplement to lower your risk of fractures. Be given hormone replacement therapy (HRT) to treat symptoms of menopause. Follow these instructions at home: Lifestyle Do not use any products that contain nicotine or tobacco, such as cigarettes, e-cigarettes, and chewing tobacco. If you need help quitting, ask your health care provider. Do not use street drugs. Do not share needles. Ask your health care provider for help if you need support or information about quitting drugs. Alcohol use Do not drink alcohol if: Your health care provider tells you not to drink. You are pregnant, may be pregnant, or are planning to become pregnant. If you drink alcohol: Limit how much you use to 0-1 drink a day. Limit intake if you are breastfeeding. Be aware of how much alcohol is in your drink. In the [place] one drink equals one 12 oz bottle of beer (355 mL), one 5 oz glass of wine (148 mL), or one 1 oz glass of hard liquor (44 mL). General instructions Schedule regular health, dental, and eye exams. Stay current with your vaccines. Tell your health care provider if: You often feel depressed. You have ever been abused or do not feel safe at home. Summary Adopting a healthy lifestyle and getting preventive care are important in promoting health and wellness. Follow your health care provider's instructions about healthy diet, exercising, and getting tested or screened for diseases. Follow your health care provider's  instructions on monitoring your cholesterol and blood pressure. This information is not intended to replace advice given to you by your health care provider. Make sure you discuss any questions you have with your health care provider. Document Revised: 10/03/2020 Document Reviewed: 07/19/2018 Elsevier Patient Education  2022 [employer] Inc[employer]  

## 2021-04-10 ENCOUNTER — Encounter: Payer: Self-pay | Admitting: Internal Medicine

## 2021-04-10 MED ORDER — ACCRUFER 30 MG PO CAPS: 1.0000 | ORAL_CAPSULE | Freq: Two times a day (BID) | ORAL | 1 refills | Status: DC

## 2021-04-10 NOTE — Addendum Note (Signed)
Addended by: Darryll Capers on: 04/10/2021 09:58 AM   Modules accepted: Orders

## 2021-04-14 ENCOUNTER — Telehealth: Payer: Self-pay

## 2021-04-15 NOTE — Telephone Encounter (Signed)
approved through 04/15/2022

## 2021-04-15 NOTE — Telephone Encounter (Signed)
PA initiated with new key below. Provided key was illegible.   Key: [id number]

## 2021-04-16 LAB — VITAMIN B1: Vitamin B1 (Thiamine): 20 nmol/L (ref 8–30)

## 2021-04-16 LAB — ZINC: Zinc: 68 ug/dL (ref 60–130)

## 2021-04-19 ENCOUNTER — Other Ambulatory Visit: Payer: Self-pay | Admitting: Internal Medicine

## 2021-04-19 ENCOUNTER — Encounter: Payer: Self-pay | Admitting: Internal Medicine

## 2021-04-19 DIAGNOSIS — E538 Deficiency of other specified B group vitamins: Secondary | ICD-10-CM

## 2021-04-19 DIAGNOSIS — Z9884 Bariatric surgery status: Secondary | ICD-10-CM

## 2021-04-19 MED ORDER — CYANOCOBALAMIN 500 MCG/0.1ML NA SOLN: 500.0000 ug | NASAL | 1 refills | Status: DC

## 2021-04-23 ENCOUNTER — Other Ambulatory Visit: Payer: Self-pay | Admitting: Internal Medicine

## 2021-04-23 DIAGNOSIS — Z9884 Bariatric surgery status: Secondary | ICD-10-CM

## 2021-04-23 DIAGNOSIS — E538 Deficiency of other specified B group vitamins: Secondary | ICD-10-CM

## 2021-06-24 LAB — HM PAP SMEAR

## 2021-07-31 ENCOUNTER — Encounter: Payer: Self-pay | Admitting: Internal Medicine

## 2021-08-05 ENCOUNTER — Telehealth: Payer: Self-pay

## 2021-08-05 ENCOUNTER — Other Ambulatory Visit: Payer: Self-pay | Admitting: Internal Medicine

## 2021-08-05 ENCOUNTER — Ambulatory Visit: Payer: Self-pay | Admitting: Internal Medicine

## 2021-08-05 DIAGNOSIS — E538 Deficiency of other specified B group vitamins: Secondary | ICD-10-CM

## 2021-08-05 MED ORDER — CYANOCOBALAMIN 1000 MCG/ML IJ SOLN: 1000.0000 ug | Freq: Once | INTRAMUSCULAR | 2 refills | Status: AC

## 2021-08-05 MED ORDER — "SYRINGE/NEEDLE (DISP) 25G X 1"" 3 ML MISC": 0 refills | Status: DC

## 2021-08-05 NOTE — Telephone Encounter (Signed)
Called to advise pt of Dr. Yetta Barre recommendation for the B12. Pt states she would prefer Rx be sent to the pharmacy and she will give the injection herself as she has in the past.

## 2021-08-05 NOTE — Telephone Encounter (Signed)
Pt called in to cancel appt scheduled for this week and will call back to reschedule after medications have been taken for 30 days to possibly recheck labs to see if they have improved.  Pt also wanted to know if she can TOC to a female doctor and requested Dr. Okey Dupre because she had previously saw her in office.   Is the TOC approved?

## 2021-08-05 NOTE — Telephone Encounter (Signed)
Done

## 2021-08-06 NOTE — Telephone Encounter (Signed)
Sorry I cannot accept right now due to high volume new patients. Would recommend she try Jorene Guest NP.

## 2021-08-06 NOTE — Telephone Encounter (Signed)
See my chart message

## 2021-09-16 ENCOUNTER — Encounter: Payer: Self-pay | Admitting: Internal Medicine

## 2021-11-07 ENCOUNTER — Other Ambulatory Visit: Payer: Self-pay | Admitting: Internal Medicine

## 2021-11-07 DIAGNOSIS — E538 Deficiency of other specified B group vitamins: Secondary | ICD-10-CM

## 2021-11-12 ENCOUNTER — Other Ambulatory Visit: Payer: Self-pay | Admitting: Internal Medicine

## 2021-11-12 DIAGNOSIS — E538 Deficiency of other specified B group vitamins: Secondary | ICD-10-CM

## 2021-11-24 ENCOUNTER — Ambulatory Visit (INDEPENDENT_AMBULATORY_CARE_PROVIDER_SITE_OTHER): Payer: BC Managed Care – PPO | Admitting: Family Medicine

## 2021-11-24 ENCOUNTER — Encounter: Payer: Self-pay | Admitting: Family Medicine

## 2021-11-24 ENCOUNTER — Encounter: Payer: Self-pay | Admitting: Internal Medicine

## 2021-11-24 VITALS — BP 100/60 | HR 70 | Temp 98.9°F | Ht 64.0 in | Wt 138.0 lb

## 2021-11-24 DIAGNOSIS — L559 Sunburn, unspecified: Secondary | ICD-10-CM

## 2021-11-24 MED ORDER — PREDNISONE 10 MG PO TABS: ORAL_TABLET | ORAL | 0 refills | Status: DC

## 2021-11-24 NOTE — Telephone Encounter (Signed)
Today at 3 @ [place]

## 2021-11-24 NOTE — Progress Notes (Signed)
? ?Subjective:  ? ? Patient ID: Robyn Bryant, female    DOB: 1975/11/26, 46 y.o.   MRN: [id number] ? ?HPI ? ?Robyn Bryant is seen as a work in today with possible sunburn.  She just got back from 8-day cruise in the [place].  She had significant sun exposure but also used sunblock SPF 70.  She did reapply this regularly.  She was in the water intermittently.  She noticed particularly involvement of her forearms dorsally but not involving sun shaded areas such as inner arm.  She has some pain but also some itching.  No fever.  Most of her involvement is forearms and arms.  Some mild involvement upper back.  Mild blistering. ? ?Past Medical History:  ?Diagnosis Date  ? Anxiety   ? Arthritis   ? Bilateral knees, wrist  ? Asthma   ? as teenager, none now  pt.denies  ? Depression   ? GERD (gastroesophageal reflux disease)   ? History of carpal tunnel syndrome   ? History of seizure   ? noted on  EEG, trigger by migraines  ? Insomnia   ? resolved  ? Low back pain   ? resolved  ? Migraines   ? Panniculitis   ? PONV (postoperative nausea and vomiting)   ? Thiamine deficiency   ? Toe fracture, left 12/26/2015  ? Vitamin B 12 deficiency   ? Vitamin D deficiency   ? pt unaware  ? ?Past Surgical History:  ?Procedure Laterality Date  ? BREAST BIOPSY Left   ? BREAST CYST ASPIRATION    ? CARPAL TUNNEL RELEASE Right 2016  ? CESAREAN SECTION  2001  ? CHOLECYSTECTOMY N/A 09/07/2013  ? Procedure: LAPAROSCOPIC CHOLECYSTECTOMY WITH INTRAOPERATIVE CHOLANGIOGRAM;  Surgeon: Mariella Saa, MD;  Location: [facility];  Service: General;  Laterality: N/A;  ? ESOPHAGOGASTRODUODENOSCOPY N/A 09/06/2013  ? Procedure: ESOPHAGOGASTRODUODENOSCOPY (EGD);  Surgeon: Kandis Cocking, MD;  Location: [facility];  Service: General;  Laterality: N/A;  ? ESOPHAGOGASTRODUODENOSCOPY (EGD) WITH PROPOFOL N/A 10/24/2017  ? Procedure: ESOPHAGOGASTRODUODENOSCOPY (EGD) WITH PROPOFOL;  Surgeon: Ovidio Kin, MD;  Location: [facility];  Service: General;  Laterality:  N/A;  ? GASTRIC ROUX-EN-Y N/A 09/12/2018  ? Procedure: LAPAROSCOPIC ROUX-EN-Y GASTRIC BYPASS REVISION FROM GASTRIC SLEEVE WITH UPPER ENDOSCOPY, ERAS Pathway;  Surgeon: Glenna Fellows, MD;  Location: [facility];  Service: General;  Laterality: N/A;  ? LAPAROSCOPIC GASTRIC SLEEVE RESECTION N/A 08/08/2013  ? Procedure: LAPAROSCOPIC GASTRIC SLEEVE RESECTION UPPER ENDOSCOPY;  Surgeon: Mariella Saa, MD;  Location: [facility];  Service: General;  Laterality: N/A;  ? TUBAL LIGATION  2001  ? UPPER GI ENDOSCOPY  08/08/2013  ? Procedure: UPPER GI ENDOSCOPY;  Surgeon: Mariella Saa, MD;  Location: [facility];  Service: General;;  ? ? reports that she has never smoked. She has never used smokeless tobacco. She reports that she does not drink alcohol and does not use drugs. ?family history includes Alcohol abuse in her father; Asthma in her sister; Diabetes in her mother; Hypertension in her mother. ?Allergies  ?Allergen Reactions  ? Gabapentin Hives  ?  Hives and dizziness   ? Morphine And Related Itching and Other (See Comments)  ?  Pt can tolerate with Benadryl.   ? Amoxicillin Itching  ? Latex Hives  ? Nsaids Other (See Comments)  ?  Per GI  ? Dilaudid [Hydromorphone] Itching and Other (See Comments)  ?  Pt can tolerate with Benadryl.   ? Tape Dermatitis, Itching and Rash  ? ? ? ?Review  of Systems  ?Constitutional:  Negative for chills and fever.  ? ?   ?Objective:  ? Physical Exam ?Vitals reviewed.  ?Constitutional:   ?   Appearance: Normal appearance.  ?Cardiovascular:  ?   Rate and Rhythm: Normal rate and regular rhythm.  ?Skin: ?   Comments: She has some reddish discoloration of both forearms.  No significant vesicles noted at this time.  Minimally tender.  No warmth.  ?Neurological:  ?   Mental Status: She is alert.  ? ? ? ? ? ?   ?Assessment & Plan:  ? ?Probable sunburn involving predominantly upper extremities.  Lesser involvement of neck and upper back region.  She does have significant component of itching which  certainly could be related to the sunburn but we did consider the fact that she could be having some reaction to the sunblock though suspect mostly this is related to sunburn. ? ?-Keep out of sun for the next several days ?-Consider brief prednisone taper ?-Follow-up as needed ? ?Kristian Covey MD ?[facility] ? ? ?

## 2021-11-25 ENCOUNTER — Ambulatory Visit: Payer: Medicaid Other | Admitting: Family Medicine

## 2021-12-09 ENCOUNTER — Telehealth: Payer: BC Managed Care – PPO | Admitting: Physician Assistant

## 2021-12-09 DIAGNOSIS — L918 Other hypertrophic disorders of the skin: Secondary | ICD-10-CM

## 2021-12-09 NOTE — Progress Notes (Signed)
?Virtual Visit Consent  ? ?Robyn Bryant, you are scheduled for a virtual visit with a [place] [facility] provider today.   ?  ?Just as with appointments in the office, your consent must be obtained to participate.  Your consent will be active for this visit and any virtual visit you may have with one of our providers in the next 365 days.   ?  ?If you have a MyChart account, a copy of this consent can be sent to you electronically.  All virtual visits are billed to your insurance company just like a traditional visit in the office.   ? ?As this is a virtual visit, video technology does not allow for your provider to perform a traditional examination.  This may limit your provider's ability to fully assess your condition.  If your provider identifies any concerns that need to be evaluated in person or the need to arrange testing (such as labs, EKG, etc.), we will make arrangements to do so.   ?  ?Although advances in technology are sophisticated, we cannot ensure that it will always work on either your end or our end.  If the connection with a video visit is poor, the visit may have to be switched to a telephone visit.  With either a video or telephone visit, we are not always able to ensure that we have a secure connection.   ? ?Also, by engaging in this virtual visit, you consent to the provision of healthcare. Additionally, you authorize for your insurance to be billed (if applicable) for the services provided during this visit. I also discussed with the patient that there may be a patient responsible charge related to this service. ? ?I need to obtain your verbal consent now.   Are you willing to proceed with your visit today?  ?  ?Robyn Bryant has provided verbal consent on 12/09/2021 for a virtual visit (video or telephone). ?  ?Robyn Loveless, PA-C  ? ?Date: 12/09/2021 8:28 AM ? ? ?Virtual Visit via Video Note  ? ?Robyn Bryant, connected with  Robyn Bryant  ([id number], 01/09/1976) on 12/09/21 at  7:45  AM EDT by a video-enabled telemedicine application and verified that I am speaking with the correct person using two identifiers. ? ?Location: ?Patient: Virtual Visit Location Patient: Mobile ?Provider: Virtual Visit Location Provider: Home Office ?  ?I discussed the limitations of evaluation and management by telemedicine and the availability of in person appointments. The patient expressed understanding and agreed to proceed.   ? ?History of Present Illness: ?Robyn Bryant is a 46 y.o. who identifies as a female who was assigned female at birth, and is being seen today for skin tag in the nose. Has a skin tag noted in the right nasal passage, attached to the septum. She has had this once before. Does not bleed or hurt.  ? ?Problems:  ?Patient Active Problem List  ? Diagnosis Date Noted  ? Breast mass, right 10/09/2020  ? Elevated LFTs 11/20/2019  ? Cervical cancer screening 11/19/2019  ? Patellofemoral syndrome of both knees 11/09/2016  ? Visit for screening mammogram 08/23/2016  ? Vitamin D deficiency 02/03/2015  ? Insomnia 01/18/2014  ? Thiamine deficiency 12/24/2013  ? Gastric bypass status for obesity 12/20/2013  ? GERD (gastroesophageal reflux disease) 09/20/2013  ? B12 deficiency 09/20/2013  ? Obesity, Class III, BMI 40-49.9 (morbid obesity) s/p lap gleeve gastrectomy 05/10/2013  ? Routine general medical examination at a health care facility 07/14/2011  ?  ?Allergies:  ?Allergies  ?  Allergen Reactions  ? Gabapentin Hives  ?  Hives and dizziness   ? Morphine And Related Itching and Other (See Comments)  ?  Pt can tolerate with Benadryl.   ? Amoxicillin Itching  ? Latex Hives  ? Nsaids Other (See Comments)  ?  Per GI  ? Dilaudid [Hydromorphone] Itching and Other (See Comments)  ?  Pt can tolerate with Benadryl.   ? Tape Dermatitis, Itching and Rash  ? ?Medications:  ?Current Outpatient Medications:  ?  Ferric Maltol (ACCRUFER) 30 MG CAPS, Take 1 capsule by mouth in the morning and at bedtime., Disp: 180  capsule, Rfl: 1 ?  predniSONE (DELTASONE) 10 MG tablet, Taper as follows over 9 days : 4-4-4-3-3-2-2-1-1, Disp: 24 tablet, Rfl: 0 ?  SYRINGE-NEEDLE, DISP, 3 ML 25G X 1" 3 ML MISC, Use to administer B12 injection, Disp: 50 each, Rfl: 0 ? ?Observations/Objective: ?Patient is well-developed, well-nourished in no acute distress.  ?Resting comfortably   ?Head is normocephalic, atraumatic.  ?No labored breathing.  ?Speech is clear and coherent with logical content.  ?Patient is alert and oriented at baseline.  ?Flesh colored mass attached by a thin pedicle on the right septum hanging into the right nasal opening consistent with a skin tag ? ?Assessment and Plan: ?1. Skin tag ? ?- In nose ?- Advised to seek in person evaluation for removal ? ?Follow Up Instructions: ?I discussed the assessment and treatment plan with the patient. The patient was provided an opportunity to ask questions and all were answered. The patient agreed with the plan and demonstrated an understanding of the instructions.  A copy of instructions were sent to the patient via MyChart unless otherwise noted below.  ? ? ?The patient was advised to call back or seek an in-person evaluation if the symptoms worsen or if the condition fails to improve as anticipated. ? ?Time:  ?I spent 10 minutes with the patient via telehealth technology discussing the above problems/concerns.   ? ?Robyn Loveless, PA-C ?

## 2021-12-09 NOTE — Patient Instructions (Signed)
?  [name], thank you for joining [name], PA-C for today's virtual visit.  While this provider is not your primary care provider (PCP), if your PCP is located in our provider database this encounter information will be shared with them immediately following your visit. ? ?Consent: ?(Patient) Robyn Bryant provided verbal consent for this virtual visit at the beginning of the encounter. ? ?Current Medications: ? ?Current Outpatient Medications:  ?  Ferric Maltol (ACCRUFER) 30 MG CAPS, Take 1 capsule by mouth in the morning and at bedtime., Disp: 180 capsule, Rfl: 1 ?  predniSONE (DELTASONE) 10 MG tablet, Taper as follows over 9 days : 4-4-4-3-3-2-2-1-1, Disp: 24 tablet, Rfl: 0 ?  SYRINGE-NEEDLE, DISP, 3 ML 25G X 1" 3 ML MISC, Use to administer B12 injection, Disp: 50 each, Rfl: 0  ? ?Medications ordered in this encounter:  ?No orders of the defined types were placed in this encounter. ?  ? ?*If you need refills on other medications prior to your next appointment, please contact your pharmacy* ? ?Follow-Up: ?Call back or seek an in-person evaluation if the symptoms worsen or if the condition fails to improve as anticipated. ? ?Other Instructions ?Seek in person evaluation for removal of skin tag ? ? ?If you have been instructed to have an in-person evaluation today at a local Urgent Care facility, please use the link below. It will take you to a list of all of our available [facility], including address, phone number and hours of operation. Please do not delay care.  ?[facility] ? ?If you or a family member do not have a primary care provider, use the link below to schedule a visit and establish care. When you choose a [facility] primary care physician or advanced practice provider, you gain a long-term partner in health. ?Find a Primary Care Provider ? ?Learn more about [facility] in-office and virtual care options: ?[facility] - Get Care Now ? ?

## 2021-12-10 ENCOUNTER — Ambulatory Visit: Payer: BC Managed Care – PPO | Admitting: Internal Medicine

## 2021-12-10 ENCOUNTER — Ambulatory Visit (INDEPENDENT_AMBULATORY_CARE_PROVIDER_SITE_OTHER): Payer: BC Managed Care – PPO | Admitting: Internal Medicine

## 2021-12-10 ENCOUNTER — Encounter: Payer: Self-pay | Admitting: Internal Medicine

## 2021-12-10 DIAGNOSIS — B079 Viral wart, unspecified: Secondary | ICD-10-CM | POA: Diagnosis not present

## 2021-12-10 DIAGNOSIS — R1013 Epigastric pain: Secondary | ICD-10-CM | POA: Insufficient documentation

## 2021-12-10 DIAGNOSIS — R1310 Dysphagia, unspecified: Secondary | ICD-10-CM | POA: Insufficient documentation

## 2021-12-10 NOTE — Patient Instructions (Signed)
Keep the wounds clean. You can wash them with liquid soap and water. Pat dry with gauze or a Kleenex tissue  Before applying antibiotic ointment. ? ? You need to report immediately  if  any signs of infection develop.  ?

## 2021-12-10 NOTE — Progress Notes (Signed)
Subjective:  Patient ID: Robyn Bryant, female    DOB: [birth year]  Age: 46 y.o. MRN: [id number]  CC: Skin Tag (Skin tag removal)   HPI Robyn Bryant presents for R nostril wart growing over time.  It is bothering her.  Outpatient Medications Prior to Visit  Medication Sig Dispense Refill   cyanocobalamin (,VITAMIN B-12,) 1000 MCG/ML injection Inject into the muscle. (Patient not taking: Reported on 12/10/2021)     SYRINGE-NEEDLE, DISP, 3 ML 25G X 1" 3 ML MISC Use to administer B12 injection (Patient not taking: Reported on 12/10/2021) 50 each 0   Ferric Maltol (ACCRUFER) 30 MG CAPS Take 1 capsule by mouth in the morning and at bedtime. 180 capsule 1   predniSONE (DELTASONE) 10 MG tablet Taper as follows over 9 days : 4-4-4-3-3-2-2-1-1 24 tablet 0   No facility-administered medications prior to visit.    ROS: Review of Systems  Constitutional:  Negative for activity change, appetite change, chills, fatigue and unexpected weight change.  HENT:  Negative for congestion, mouth sores and sinus pressure.   Eyes:  Negative for visual disturbance.  Respiratory:  Negative for cough and chest tightness.   Gastrointestinal:  Negative for abdominal pain and nausea.  Genitourinary:  Negative for difficulty urinating, frequency and vaginal pain.  Musculoskeletal:  Negative for back pain and gait problem.  Skin:  Negative for pallor and rash.  Neurological:  Negative for dizziness, tremors, weakness, numbness and headaches.  Psychiatric/Behavioral:  Negative for confusion and sleep disturbance.    Objective:  BP (!) 96/58 (BP Location: Left Arm, Patient Position: Sitting, Cuff Size: Normal)   Pulse 68   Temp 98.2 F (36.8 C) (Oral)   Ht 5\' 4"  (1.626 m)   Wt 137 lb (62.1 kg)   SpO2 96%   BMI 23.52 kg/m   BP Readings from Last 3 Encounters:  12/10/21 (!) 96/58  11/24/21 100/60  04/09/21 110/60    Wt Readings from Last 3 Encounters:  12/10/21 137 lb (62.1 kg)  11/24/21 138 lb (62.6  kg)  04/09/21 139 lb (63 kg)    Physical Exam Right nostril papilloma wart measuring 5 x 2 mm   Procedure Note :     Procedure : Cryosurgery   Indication:  Wart(s)     Risks including unsuccessful procedure , bleeding, infection, bruising, scar, a need for a repeat  procedure and others were explained to the patient in detail as well as the benefits. Informed consent was obtained verbally.   One lesion(s) in the right nostril   was/were treated with liquid nitrogen on a Q-tip in a usual fasion . Band-Aid was applied and antibiotic ointment was given for a later use.   Tolerated well. Complications none.   Postprocedure instructions :     Keep the wounds clean. You can wash them with liquid soap and water. Pat dry with gauze or a Kleenex tissue  Before applying antibiotic ointment    You need to report immediately  if  any signs of infection develop.   Lab Results  Component Value Date   WBC 5.0 04/09/2021   HGB 10.5 (L) 04/09/2021   HCT 31.9 (L) 04/09/2021   PLT 344.0 04/09/2021   GLUCOSE 91 04/09/2021   CHOL 126 04/09/2021   TRIG 64.0 04/09/2021   HDL 62.50 04/09/2021   LDLCALC 51 04/09/2021   ALT 17 04/09/2021   AST 18 04/09/2021   NA 141 04/09/2021   K 4.2 04/09/2021   CL 107 04/09/2021  CREATININE 0.65 04/09/2021   BUN 9 04/09/2021   CO2 28 04/09/2021   TSH 1.53 05/31/2019   HGBA1C 5.2 05/31/2019    US BREAST LTD UNI RIGHT INC AXILLA  Result Date: 10/29/2020 CLINICAL DATA:  46 year old female for follow-up of RIGHT breast abscess, aspirated and placed on antibiotics 2 weeks ago. The patient states that her pain and erythema have completely resolved. EXAM: ULTRASOUND OF THE RIGHT BREAST COMPARISON:  Previous exam(s). FINDINGS: On physical exam, no erythema in the RIGHT breast identified. Targeted ultrasound is performed, showing no recurrence of cyst/abscess at the 9:30 position of the RIGHT breast 5 cm from the nipple, at the site of prior aspirated abscess.  IMPRESSION: No residual abscess in the OUTER RIGHT breast. RECOMMENDATION: Bilateral screening mammogram in September 2022 to resume annual mammogram schedule I have discussed the findings and recommendations with the patient. If applicable, a reminder letter will be sent to the patient regarding the next appointment. BI-RADS CATEGORY  1: Negative. Electronically Signed   By: Harmon Pier M.D.   On: 10/29/2020 15:12    Assessment & Plan:   Problem List Items Addressed This Visit     Wart of face    Right nostril papilloma wart measuring 5 x 2 mm Options to treat including topical prescriptions were discussed.  Surgical removal versus cryo procedure was discussed.  Dermatology appointment was offered.  She elected to do a cryo treatment. See cryo procedure          No orders of the defined types were placed in this encounter.     Follow-up: No follow-ups on file.  Sonda Primes, MD

## 2021-12-10 NOTE — Assessment & Plan Note (Addendum)
Right nostril papilloma wart measuring 5 x 2 mm Options to treat including topical prescriptions were discussed.  Surgical removal versus cryo procedure was discussed.  Dermatology appointment was offered.  She elected to do a cryo treatment. See cryo procedure

## 2022-01-04 ENCOUNTER — Encounter: Payer: Self-pay | Admitting: Internal Medicine

## 2022-02-17 ENCOUNTER — Ambulatory Visit: Payer: BC Managed Care – PPO | Admitting: Internal Medicine

## 2022-02-17 ENCOUNTER — Telehealth: Payer: Self-pay

## 2022-02-17 DIAGNOSIS — B079 Viral wart, unspecified: Secondary | ICD-10-CM

## 2022-02-17 NOTE — Telephone Encounter (Signed)
Pt is requesting a referral for the skin tag that was removed in May but is has grown back. Pt called to cx the appt scheduled for today not willing to be seen for it again until the referral.  Please advise

## 2022-02-18 ENCOUNTER — Encounter: Payer: Self-pay | Admitting: Internal Medicine

## 2022-02-18 NOTE — Telephone Encounter (Signed)
Ok Thx 

## 2022-03-08 ENCOUNTER — Other Ambulatory Visit: Payer: Self-pay | Admitting: Internal Medicine

## 2022-03-08 DIAGNOSIS — J3489 Other specified disorders of nose and nasal sinuses: Secondary | ICD-10-CM | POA: Insufficient documentation

## 2022-03-09 ENCOUNTER — Ambulatory Visit: Payer: BC Managed Care – PPO | Admitting: Internal Medicine

## 2022-04-02 ENCOUNTER — Encounter (HOSPITAL_COMMUNITY): Payer: Self-pay | Admitting: *Deleted

## 2022-05-03 ENCOUNTER — Encounter: Payer: BC Managed Care – PPO | Admitting: Internal Medicine

## 2022-05-27 ENCOUNTER — Telehealth: Payer: BC Managed Care – PPO | Admitting: Emergency Medicine

## 2022-05-27 ENCOUNTER — Telehealth: Payer: BC Managed Care – PPO | Admitting: Adult Health

## 2022-05-27 DIAGNOSIS — J069 Acute upper respiratory infection, unspecified: Secondary | ICD-10-CM | POA: Diagnosis not present

## 2022-05-27 NOTE — Patient Instructions (Signed)
  Ninfa Meeker, thank you for joining Carvel Getting, NP for today's virtual visit.  While this provider is not your primary care provider (PCP), if your PCP is located in our provider database this encounter information will be shared with them immediately following your visit.   [facility] account gives you access to today's visit and all your visits, tests, and labs performed at [facility] " click here if you don't have a [facility] account or go to [email or web address]  Consent: (Patient) Robyn Bryant provided verbal consent for this virtual visit at the beginning of the encounter.  Current Medications:  Current Outpatient Medications:    cyanocobalamin (,VITAMIN B-12,) 1000 MCG/ML injection, Inject into the muscle. (Patient not taking: Reported on 12/10/2021), Disp: , Rfl:    SYRINGE-NEEDLE, DISP, 3 ML 25G X 1" 3 ML MISC, Use to administer B12 injection (Patient not taking: Reported on 12/10/2021), Disp: 50 each, Rfl: 0   Medications ordered in this encounter:  No orders of the defined types were placed in this encounter.    *If you need refills on other medications prior to your next appointment, please contact your pharmacy*  Follow-Up: Call back or seek an in-person evaluation if the symptoms worsen or if the condition fails to improve as anticipated.  [place] [phone]  Other Instructions Try Mucinex (or generic guaifenesin) and saline nasal spray if you are having a lot of congestion. Drink plenty of fluids. Use tylenol per package directions if you have pain or fever. REST! You are fatigued because your body is fighting off an infection.    If you have been instructed to have an in-person evaluation today at a local Urgent Care facility, please use the link below. It will take you to a list of all of our available [facility], including address, phone number and hours of operation. Please do not delay  care.  [facility]  If you or a family member do not have a primary care provider, use the link below to schedule a visit and establish care. When you choose a [facility] primary care physician or advanced practice provider, you gain a long-term partner in health. Find a Primary Care Provider  Learn more about [facility]'s in-office and virtual care options: [facility]

## 2022-05-27 NOTE — Progress Notes (Signed)
Virtual Visit Consent   Robyn Bryant, you are scheduled for a virtual visit with a [facility] provider today. Just as with appointments in the office, your consent must be obtained to participate. Your consent will be active for this visit and any virtual visit you may have with one of our providers in the next 365 days. If you have a MyChart account, a copy of this consent can be sent to you electronically.  As this is a virtual visit, video technology does not allow for your provider to perform a traditional examination. This may limit your provider's ability to fully assess your condition. If your provider identifies any concerns that need to be evaluated in person or the need to arrange testing (such as labs, EKG, etc.), we will make arrangements to do so. Although advances in technology are sophisticated, we cannot ensure that it will always work on either your end or our end. If the connection with a video visit is poor, the visit may have to be switched to a telephone visit. With either a video or telephone visit, we are not always able to ensure that we have a secure connection.  By engaging in this virtual visit, you consent to the provision of healthcare and authorize for your insurance to be billed (if applicable) for the services provided during this visit. Depending on your insurance coverage, you may receive a charge related to this service.  I need to obtain your verbal consent now. Are you willing to proceed with your visit today? [name] has provided verbal consent on 05/27/2022 for a virtual visit (video or telephone). Cathlyn Parsons, NP  Date: 05/27/2022 1:44 PM  Virtual Visit via Video Note   I, Cathlyn Parsons, connected with  Robyn Bryant  ([id number], 08-22-1975) on 05/27/22 at  1:15 PM EDT by a video-enabled telemedicine application and verified that I am speaking with the correct person using two identifiers.  Location: Patient: Virtual Visit Location Patient:  Home Provider: Virtual Visit Location Provider: Home Office   I discussed the limitations of evaluation and management by telemedicine and the availability of in person appointments. The patient expressed understanding and agreed to proceed.    History of Present Illness: Robyn Bryant is a 46 y.o. who identifies as a female who was assigned female at birth, and is being seen today for feeling sick. Has been sick for last 2 days, started feeling worse last night. Complains of fatigue, body aches, chills, post nasal drainage, sore throat. Is not congested. 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 Has been taking Nyquil for temporary improvement of symptoms. Has not taken her temperature. Tested negative for COVID at home.   HPI: HPI  Problems:  Patient Active Problem List   Diagnosis Date Noted   Nasal mass 03/08/2022   Dysphagia 12/10/2021   Epigastric pain 12/10/2021   Wart of face 12/10/2021   Breast mass, right 10/09/2020   Elevated LFTs 11/20/2019   Cervical cancer screening 11/19/2019   Dizzy spells 05/21/2019   Patellofemoral syndrome of both knees 11/09/2016   Visit for screening mammogram 08/23/2016   Vitamin D deficiency 02/03/2015   Gastric bypass status for obesity 12/20/2013   Routine general medical examination at a health care facility 07/14/2011    Allergies:  Allergies  Allergen Reactions   Gabapentin Hives    Hives and dizziness    Morphine And Related Itching and Other (See Comments)    Pt can tolerate with  Benadryl.    Amoxicillin Itching   Latex Hives   Nsaids Other (See Comments)    Per GI   Dilaudid [Hydromorphone] Itching and Other (See Comments)    Pt can tolerate with Benadryl.    Tape Dermatitis, Itching and Rash   Medications:  Current Outpatient Medications:    cyanocobalamin (,VITAMIN B-12,) 1000 MCG/ML injection, Inject into the muscle. (Patient not taking: Reported on 12/10/2021), Disp: , Rfl:    SYRINGE-NEEDLE, DISP, 3 ML  25G X 1" 3 ML MISC, Use to administer B12 injection (Patient not taking: Reported on 12/10/2021), Disp: 50 each, Rfl: 0  Observations/Objective: Patient is well-developed, well-nourished in no acute distress.  Resting comfortably  at home.  Head is normocephalic, atraumatic.  No labored breathing.  Speech is clear and coherent with logical content.  Patient is alert and oriented at baseline.    Assessment and Plan: 1. Upper respiratory tract infection, unspecified type  BAsed on symptoms and circulating illnesses, I think it's likely this is not influenza. Could still be covid despite negative home test; advised pt to consider testing for covid again in another day or so.   Reviewed supportive care measures. Given note for work.   Follow Up Instructions: I discussed the assessment and treatment plan with the patient. The patient was provided an opportunity to ask questions and all were answered. The patient agreed with the plan and demonstrated an understanding of the instructions.  A copy of instructions were sent to the patient via MyChart unless otherwise noted below.   The patient was advised to call back or seek an in-person evaluation if the symptoms worsen or if the condition fails to improve as anticipated.  Time:  I spent 15 minutes with the patient via telehealth technology discussing the above problems/concerns.    Carvel Getting, NP

## 2022-05-28 ENCOUNTER — Encounter: Payer: BC Managed Care – PPO | Admitting: Nurse Practitioner

## 2022-05-28 NOTE — Progress Notes (Signed)
I called patient to complete her virtual visit this afternoon. She reported that she has already completed a virtual visit and would like to cancel this appointment.

## 2022-06-10 ENCOUNTER — Encounter: Payer: BC Managed Care – PPO | Admitting: Internal Medicine

## 2022-06-21 ENCOUNTER — Ambulatory Visit: Payer: BC Managed Care – PPO

## 2022-06-21 ENCOUNTER — Ambulatory Visit (INDEPENDENT_AMBULATORY_CARE_PROVIDER_SITE_OTHER): Payer: BC Managed Care – PPO

## 2022-06-21 DIAGNOSIS — E538 Deficiency of other specified B group vitamins: Secondary | ICD-10-CM | POA: Diagnosis not present

## 2022-06-21 DIAGNOSIS — Z23 Encounter for immunization: Secondary | ICD-10-CM

## 2022-06-21 MED ORDER — CYANOCOBALAMIN 1000 MCG/ML IJ SOLN
1000.0000 ug | Freq: Once | INTRAMUSCULAR | Status: AC
  Administered 2022-06-21: 1000 ug via INTRAMUSCULAR

## 2022-06-21 NOTE — Progress Notes (Signed)
Pt was given regular flu vaccine w/o any complications, questions or concerns at this time.  Pt was also given B12 injection per verbal ok by Dr. Yetta Barre w/o any complications, questions and or concerns att his time.

## 2022-07-22 ENCOUNTER — Ambulatory Visit: Payer: BC Managed Care – PPO

## 2022-08-05 ENCOUNTER — Other Ambulatory Visit: Payer: Self-pay | Admitting: Internal Medicine

## 2022-08-05 ENCOUNTER — Ambulatory Visit: Payer: BC Managed Care – PPO

## 2022-08-05 ENCOUNTER — Encounter: Payer: Self-pay | Admitting: *Deleted

## 2022-08-05 ENCOUNTER — Ambulatory Visit (INDEPENDENT_AMBULATORY_CARE_PROVIDER_SITE_OTHER): Payer: BC Managed Care – PPO

## 2022-08-05 DIAGNOSIS — E538 Deficiency of other specified B group vitamins: Secondary | ICD-10-CM

## 2022-08-05 MED ORDER — CYANOCOBALAMIN 1000 MCG/ML IJ SOLN
1000.0000 ug | Freq: Once | INTRAMUSCULAR | Status: AC
  Administered 2022-08-05: 1000 ug via INTRAMUSCULAR

## 2022-08-05 NOTE — Progress Notes (Signed)
Patient here for monthly B12 injection per Dr. [name].  B12 1000 mcg given in left IM and patient tolerated injection well today.  

## 2022-09-06 ENCOUNTER — Ambulatory Visit: Payer: BC Managed Care – PPO

## 2022-09-16 ENCOUNTER — Encounter: Payer: BC Managed Care – PPO | Admitting: Internal Medicine

## 2022-09-24 ENCOUNTER — Telehealth: Payer: BC Managed Care – PPO | Admitting: Physician Assistant

## 2022-09-24 DIAGNOSIS — J039 Acute tonsillitis, unspecified: Secondary | ICD-10-CM

## 2022-09-24 MED ORDER — AZITHROMYCIN 250 MG PO TABS: ORAL_TABLET | ORAL | 0 refills | Status: AC

## 2022-09-24 MED ORDER — COVID-19 AT-HOME TEST VI KIT: PACK | 0 refills | Status: DC

## 2022-09-24 MED ORDER — BENZONATATE 100 MG PO CAPS: 100.0000 mg | ORAL_CAPSULE | Freq: Three times a day (TID) | ORAL | 0 refills | Status: DC | PRN

## 2022-09-24 NOTE — Addendum Note (Signed)
Addended by: Brunetta Jeans on: 09/24/2022 05:05 PM   Modules accepted: Orders

## 2022-09-24 NOTE — Progress Notes (Signed)
Virtual Visit Consent   Robyn Bryant, you are scheduled for a virtual visit with a [facility] provider today. Just as with appointments in the office, your consent must be obtained to participate. Your consent will be active for this visit and any virtual visit you may have with one of our providers in the next 365 days. If you have a MyChart account, a copy of this consent can be sent to you electronically.  As this is a virtual visit, video technology does not allow for your provider to perform a traditional examination. This may limit your provider's ability to fully assess your condition. If your provider identifies any concerns that need to be evaluated in person or the need to arrange testing (such as labs, EKG, etc.), we will make arrangements to do so. Although advances in technology are sophisticated, we cannot ensure that it will always work on either your end or our end. If the connection with a video visit is poor, the visit may have to be switched to a telephone visit. With either a video or telephone visit, we are not always able to ensure that we have a secure connection.  By engaging in this virtual visit, you consent to the provision of healthcare and authorize for your insurance to be billed (if applicable) for the services provided during this visit. Depending on your insurance coverage, you may receive a charge related to this service.  I need to obtain your verbal consent now. Are you willing to proceed with your visit today? [name] has provided verbal consent on 09/24/2022 for a virtual visit (video or telephone). Robyn Bryant, [place]  Date: 09/24/2022 4:33 PM  Virtual Visit via Video Note   I, Robyn Bryant, connected with  [name]  ([id number], February 05, 1976) on 09/24/22 at  4:30 PM EST by a video-enabled telemedicine application and verified that I am speaking with the correct person using two identifiers.  Location: Patient: Virtual Visit Location  Patient: Home Provider: Virtual Visit Location Provider: Home Office   I discussed the limitations of evaluation and management by telemedicine and the availability of in person appointments. The patient expressed understanding and agreed to proceed.    History of Present Illness: Robyn Bryant is a 47 y.o. who identifies as a female who was assigned female at birth, and is being seen today for severe sore throat for 2 days with odynophagia and voice hoarseness. Occasional dry cough. Denies fever, chills, aches. Noted white spots in the back of her throat on tonsils. Now with headache.  Just returned from a cruise. Has not tested for COVID.   HPI: HPI  Problems:  Patient Active Problem List   Diagnosis Date Noted   Nasal mass 03/08/2022   Dysphagia 12/10/2021   Epigastric pain 12/10/2021   Wart of face 12/10/2021   Breast mass, right 10/09/2020   Elevated LFTs 11/20/2019   Cervical cancer screening 11/19/2019   Dizzy spells 05/21/2019   Patellofemoral syndrome of both knees 11/09/2016   Visit for screening mammogram 08/23/2016   Vitamin D deficiency 02/03/2015   Gastric bypass status for obesity 12/20/2013   Routine general medical examination at a health care facility 07/14/2011    Allergies:  Allergies  Allergen Reactions   Gabapentin Hives    Hives and dizziness    Morphine And Related Itching and Other (See Comments)    Pt can tolerate with Benadryl.    Amoxicillin Itching   Latex Hives   Nsaids Other (See Comments)  Per GI   Dilaudid [Hydromorphone] Itching and Other (See Comments)    Pt can tolerate with Benadryl.    Tape Dermatitis, Itching and Rash   Medications:  Current Outpatient Medications:    azithromycin (ZITHROMAX) 250 MG tablet, Take 2 tablets on day 1, then 1 tablet daily on days 2 through 5, Disp: 6 tablet, Rfl: 0   benzonatate (TESSALON) 100 MG capsule, Take 1 capsule (100 mg total) by mouth 3 (three) times daily as needed for cough., Disp: 30  capsule, Rfl: 0  Observations/Objective: Patient is well-developed, well-nourished in no acute distress.  Resting comfortably possible at home.  Head is normocephalic, atraumatic.  No labored breathing. Speech is clear and coherent with logical content.  Patient is alert and oriented at baseline.  Difficult to fully visualize oropharynx due to her video quality.   Assessment and Plan: 1. Exudative tonsillitis - azithromycin (ZITHROMAX) 250 MG tablet; Take 2 tablets on day 1, then 1 tablet daily on days 2 through 5  Dispense: 6 tablet; Refill: 0 - benzonatate (TESSALON) 100 MG capsule; Take 1 capsule (100 mg total) by mouth 3 (three) times daily as needed for cough.  Dispense: 30 capsule; Refill: 0  Want her to COVID test due to recent travel. Giving her concerns for strep and noted exudate, will start Azithromycin. Supportive measures and OTC medications reviewed. Strict in-person follow-up precautions discussed.   Follow Up Instructions: I discussed the assessment and treatment plan with the patient. The patient was provided an opportunity to ask questions and all were answered. The patient agreed with the plan and demonstrated an understanding of the instructions.  A copy of instructions were sent to the patient via MyChart unless otherwise noted below.   The patient was advised to call back or seek an in-person evaluation if the symptoms worsen or if the condition fails to improve as anticipated.  Time:  I spent 10 minutes with the patient via telehealth technology discussing the above problems/concerns.    Robyn Rio, PA-C

## 2022-09-24 NOTE — Patient Instructions (Signed)
  Robyn Bryant, thank you for joining Robyn Rio, PA-C for today's virtual visit.  While this provider is not your primary care provider (PCP), if your PCP is located in our provider database this encounter information will be shared with them immediately following your visit.   [facility] account gives you access to today's visit and all your visits, tests, and labs performed at [facility] " click here if you don't have a [facility] account or go to [email or web address]  Consent: (Patient) Robyn Bryant provided verbal consent for this virtual visit at the beginning of the encounter.  Current Medications: No current outpatient medications on file.   Medications ordered in this encounter:  No orders of the defined types were placed in this encounter.    *If you need refills on other medications prior to your next appointment, please contact your pharmacy*  Follow-Up: Call back or seek an in-person evaluation if the symptoms worsen or if the condition fails to improve as anticipated.  [facility] [phone]  Other Instructions COVID test as discussed to be cautious. If positive, let us know.  Keep hydrated and rest.  Ok to continue Tylenol. Start salt-water gargles. Take antibiotic as directed. If anything worsens despite treatment and negative testing, I want you to be evaluated in person.    If you have been instructed to have an in-person evaluation today at a local Urgent Care facility, please use the link below. It will take you to a list of all of our available [facility], including address, phone number and hours of operation. Please do not delay care.  [facility]  If you or a family member do not have a primary care provider, use the link below to schedule a visit and establish care. When you choose a [facility] primary care physician or advanced practice provider, you gain a  long-term partner in health. Find a Primary Care Provider  Learn more about [facility]'s in-office and virtual care options: [facility]

## 2022-12-27 ENCOUNTER — Encounter: Payer: BC Managed Care – PPO | Admitting: Internal Medicine

## 2023-01-24 ENCOUNTER — Encounter: Payer: BC Managed Care – PPO | Admitting: Internal Medicine

## 2023-01-24 ENCOUNTER — Encounter: Payer: Self-pay | Admitting: Internal Medicine

## 2023-01-24 ENCOUNTER — Ambulatory Visit (INDEPENDENT_AMBULATORY_CARE_PROVIDER_SITE_OTHER): Payer: BC Managed Care – PPO | Admitting: Internal Medicine

## 2023-01-24 ENCOUNTER — Ambulatory Visit (INDEPENDENT_AMBULATORY_CARE_PROVIDER_SITE_OTHER): Payer: BC Managed Care – PPO

## 2023-01-24 ENCOUNTER — Encounter: Payer: Self-pay | Admitting: Gastroenterology

## 2023-01-24 VITALS — BP 110/66 | HR 61 | Temp 98.4°F | Ht 64.0 in | Wt 127.0 lb

## 2023-01-24 DIAGNOSIS — E538 Deficiency of other specified B group vitamins: Secondary | ICD-10-CM

## 2023-01-24 DIAGNOSIS — E559 Vitamin D deficiency, unspecified: Secondary | ICD-10-CM | POA: Diagnosis not present

## 2023-01-24 DIAGNOSIS — R001 Bradycardia, unspecified: Secondary | ICD-10-CM

## 2023-01-24 DIAGNOSIS — Z0001 Encounter for general adult medical examination with abnormal findings: Secondary | ICD-10-CM | POA: Insufficient documentation

## 2023-01-24 DIAGNOSIS — Z0000 Encounter for general adult medical examination without abnormal findings: Secondary | ICD-10-CM | POA: Diagnosis not present

## 2023-01-24 DIAGNOSIS — Z9884 Bariatric surgery status: Secondary | ICD-10-CM | POA: Diagnosis not present

## 2023-01-24 DIAGNOSIS — Z01818 Encounter for other preprocedural examination: Secondary | ICD-10-CM

## 2023-01-24 DIAGNOSIS — D508 Other iron deficiency anemias: Secondary | ICD-10-CM

## 2023-01-24 DIAGNOSIS — R42 Dizziness and giddiness: Secondary | ICD-10-CM

## 2023-01-24 DIAGNOSIS — Z1211 Encounter for screening for malignant neoplasm of colon: Secondary | ICD-10-CM | POA: Insufficient documentation

## 2023-01-24 DIAGNOSIS — D539 Nutritional anemia, unspecified: Secondary | ICD-10-CM | POA: Diagnosis not present

## 2023-01-24 LAB — CBC WITH DIFFERENTIAL/PLATELET
Basophils Absolute: 0 10*3/uL (ref 0.0–0.1)
Basophils Relative: 0.6 % (ref 0.0–3.0)
Eosinophils Absolute: 0.2 10*3/uL (ref 0.0–0.7)
Eosinophils Relative: 3.1 % (ref 0.0–5.0)
HCT: 36.2 % (ref 36.0–46.0)
Hemoglobin: 11.8 g/dL — ABNORMAL LOW (ref 12.0–15.0)
Lymphocytes Relative: 35 % (ref 12.0–46.0)
Lymphs Abs: 2 10*3/uL (ref 0.7–4.0)
MCHC: 32.5 g/dL (ref 30.0–36.0)
MCV: 78.3 fl (ref 78.0–100.0)
Monocytes Absolute: 0.5 10*3/uL (ref 0.1–1.0)
Monocytes Relative: 9.8 % (ref 3.0–12.0)
Neutro Abs: 2.9 10*3/uL (ref 1.4–7.7)
Neutrophils Relative %: 51.5 % (ref 43.0–77.0)
Platelets: 345 10*3/uL (ref 150.0–400.0)
RBC: 4.62 Mil/uL (ref 3.87–5.11)
RDW: 15.6 % — ABNORMAL HIGH (ref 11.5–15.5)
WBC: 5.6 10*3/uL (ref 4.0–10.5)

## 2023-01-24 LAB — URINALYSIS, ROUTINE W REFLEX MICROSCOPIC
Bilirubin Urine: NEGATIVE
Hgb urine dipstick: NEGATIVE
Ketones, ur: NEGATIVE
Leukocytes,Ua: NEGATIVE
Nitrite: NEGATIVE
RBC / HPF: NONE SEEN (ref 0–?)
Specific Gravity, Urine: 1.015 (ref 1.000–1.030)
Total Protein, Urine: NEGATIVE
Urine Glucose: NEGATIVE
Urobilinogen, UA: 0.2 (ref 0.0–1.0)
pH: 5.5 (ref 5.0–8.0)

## 2023-01-24 LAB — HEPATIC FUNCTION PANEL
ALT: 23 U/L (ref 0–35)
AST: 23 U/L (ref 0–37)
Albumin: 4.4 g/dL (ref 3.5–5.2)
Alkaline Phosphatase: 69 U/L (ref 39–117)
Bilirubin, Direct: 0.2 mg/dL (ref 0.0–0.3)
Total Bilirubin: 1.1 mg/dL (ref 0.2–1.2)
Total Protein: 7.8 g/dL (ref 6.0–8.3)

## 2023-01-24 LAB — BASIC METABOLIC PANEL
BUN: 10 mg/dL (ref 6–23)
CO2: 26 mEq/L (ref 19–32)
Calcium: 9.2 mg/dL (ref 8.4–10.5)
Chloride: 104 mEq/L (ref 96–112)
Creatinine, Ser: 0.68 mg/dL (ref 0.40–1.20)
GFR: 104.43 mL/min (ref >60.00)
Glucose, Bld: 94 mg/dL (ref 70–99)
Potassium: 3.7 mEq/L (ref 3.5–5.1)
Sodium: 139 mEq/L (ref 135–145)

## 2023-01-24 LAB — IBC + FERRITIN
Ferritin: 5.9 ng/mL — ABNORMAL LOW (ref 10.0–291.0)
Iron: 39 ug/dL — ABNORMAL LOW (ref 42–145)
Saturation Ratios: 6.9 % — ABNORMAL LOW (ref 20.0–50.0)
TIBC: 564.2 ug/dL — ABNORMAL HIGH (ref 250.0–450.0)
Transferrin: 403 mg/dL — ABNORMAL HIGH (ref 212.0–360.0)

## 2023-01-24 LAB — VITAMIN D 25 HYDROXY (VIT D DEFICIENCY, FRACTURES): VITD: 22.35 ng/mL — ABNORMAL LOW (ref 30.00–100.00)

## 2023-01-24 LAB — FOLATE: Folate: 7 ng/mL (ref >5.9)

## 2023-01-24 LAB — VITAMIN B12: Vitamin B-12: 176 pg/mL — ABNORMAL LOW (ref 211–911)

## 2023-01-24 LAB — PROTIME-INR
INR: 1.2 ratio — ABNORMAL HIGH (ref 0.8–1.0)
Prothrombin Time: 12.3 s (ref 9.6–13.1)

## 2023-01-24 LAB — TSH: TSH: 1.64 u[IU]/mL (ref 0.35–5.50)

## 2023-01-24 LAB — APTT: aPTT: 29.6 s (ref 25.4–36.8)

## 2023-01-24 LAB — HCG, QUANTITATIVE, PREGNANCY: Quantitative HCG: <0.6 m[IU]/mL

## 2023-01-24 MED ORDER — CHOLECALCIFEROL 1.25 MG (50000 UT) PO CAPS
50000.0000 [IU] | ORAL_CAPSULE | ORAL | 0 refills | Status: DC
Start: 2023-01-24 — End: 2023-01-31

## 2023-01-24 MED ORDER — ACCRUFER 30 MG PO CAPS
1.0000 | ORAL_CAPSULE | Freq: Two times a day (BID) | ORAL | 1 refills | Status: DC
Start: 2023-01-24 — End: 2023-01-31

## 2023-01-24 NOTE — Progress Notes (Unsigned)
Subjective:  Patient ID: Robyn Bryant, female    DOB: 10/23/1975  Age: 47 y.o. MRN: [id number]  CC: Annual Exam and Anemia   HPI Robyn Bryant presents for a CPX and f/up ----  Her dizzy spells have improved. She requests pre-op testing.     Outpatient Medications Prior to Visit  Medication Sig Dispense Refill   COVID-19 At-Home Test KIT Use as directed to test for COVID 1 kit 0   benzonatate (TESSALON) 100 MG capsule Take 1 capsule (100 mg total) by mouth 3 (three) times daily as needed for cough. 30 capsule 0   No facility-administered medications prior to visit.    ROS Review of Systems  Constitutional:  Negative for appetite change, diaphoresis and fatigue.  HENT: Negative.    Eyes: Negative.   Respiratory:  Negative for cough, chest tightness, shortness of breath and wheezing.   Cardiovascular:  Negative for chest pain, palpitations and leg swelling.  Gastrointestinal:  Negative for abdominal pain, blood in stool, constipation, diarrhea, nausea and vomiting.  Endocrine: Negative.   Genitourinary: Negative.  Negative for difficulty urinating.  Musculoskeletal: Negative.   Skin:  Positive for pallor.  Neurological:  Positive for dizziness. Negative for weakness, light-headedness, numbness and headaches.  Hematological:  Negative for adenopathy. Does not bruise/bleed easily.  Psychiatric/Behavioral: Negative.      Objective:  BP 110/66 (BP Location: Left Arm, Patient Position: Sitting, Cuff Size: Normal)   Pulse 61   Temp 98.4 F (36.9 C) (Oral)   Ht 5\' 4"  (1.626 m)   Wt 127 lb (57.6 kg)   SpO2 98%   BMI 21.80 kg/m   BP Readings from Last 3 Encounters:  01/24/23 110/66  12/10/21 (!) 96/58  11/24/21 100/60    Wt Readings from Last 3 Encounters:  01/24/23 127 lb (57.6 kg)  12/10/21 137 lb (62.1 kg)  11/24/21 138 lb (62.6 kg)    Physical Exam Vitals reviewed.  HENT:     Nose: Nose normal.     Mouth/Throat:     Mouth: Mucous membranes are moist.   Eyes:     General: No scleral icterus.    Conjunctiva/sclera: Conjunctivae normal.  Cardiovascular:     Rate and Rhythm: Regular rhythm. Bradycardia present.     Heart sounds: Normal heart sounds, S1 normal and S2 normal. No murmur heard.    No gallop.     Comments: EKG- SB, 57 bpm Otherwise normal EKG Pulmonary:     Effort: Pulmonary effort is normal.     Breath sounds: No stridor. No wheezing, rhonchi or rales.  Abdominal:     General: Abdomen is flat.     Palpations: There is no mass.     Tenderness: There is no abdominal tenderness. There is no guarding.     Hernia: No hernia is present.  Musculoskeletal:        General: Normal range of motion.     Cervical back: Neck supple.     Right lower leg: No edema.     Left lower leg: No edema.  Skin:    General: Skin is warm and dry.     Coloration: Skin is pale. Skin is not jaundiced.     Findings: No bruising or rash.  Neurological:     General: No focal deficit present.     Mental Status: She is alert. Mental status is at baseline.  Psychiatric:        Mood and Affect: Mood normal.  Behavior: Behavior normal.     Lab Results  Component Value Date   WBC 5.6 01/24/2023   HGB 11.8 (L) 01/24/2023   HCT 36.2 01/24/2023   PLT 345.0 01/24/2023   GLUCOSE 94 01/24/2023   CHOL 126 04/09/2021   TRIG 64.0 04/09/2021   HDL 62.50 04/09/2021   LDLCALC 51 04/09/2021   ALT 23 01/24/2023   AST 23 01/24/2023   NA 139 01/24/2023   K 3.7 01/24/2023   CL 104 01/24/2023   CREATININE 0.68 01/24/2023   BUN 10 01/24/2023   CO2 26 01/24/2023   TSH 1.64 01/24/2023   INR 1.2 (H) 01/24/2023   HGBA1C 5.2 05/31/2019    US BREAST LTD UNI RIGHT INC AXILLA  Result Date: 10/29/2020 CLINICAL DATA:  47 year old female for follow-up of RIGHT breast abscess, aspirated and placed on antibiotics 2 weeks ago. The patient states that her pain and erythema have completely resolved. EXAM: ULTRASOUND OF THE RIGHT BREAST COMPARISON:  Previous  exam(s). FINDINGS: On physical exam, no erythema in the RIGHT breast identified. Targeted ultrasound is performed, showing no recurrence of cyst/abscess at the 9:30 position of the RIGHT breast 5 cm from the nipple, at the site of prior aspirated abscess. IMPRESSION: No residual abscess in the OUTER RIGHT breast. RECOMMENDATION: Bilateral screening mammogram in September 2022 to resume annual mammogram schedule I have discussed the findings and recommendations with the patient. If applicable, a reminder letter will be sent to the patient regarding the next appointment. BI-RADS CATEGORY  1: Negative. Electronically Signed   By: Harmon Pier M.D.   On: 10/29/2020 15:12   DG Chest 2 View  Result Date: 01/27/2023 CLINICAL DATA:  pre-op EXAM: CHEST - 2 VIEW COMPARISON:  09/07/2018 FINDINGS: Cardiac silhouette is unremarkable. No pneumothorax or pleural effusion. The lungs are clear. The visualized skeletal structures are unremarkable. IMPRESSION: No acute cardiopulmonary process. Electronically Signed   By: Layla Maw M.D.   On: 01/27/2023 22:13     Assessment & Plan:   Dizzy spells -     Protime-INR; Future -     APTT; Future -     hCG, quantitative, pregnancy; Future -     HIV Antibody (routine testing w rflx); Future -     HIV-1/HIV-2 Qualitative RNA; Future -     Basic metabolic panel; Future -     Hepatic function panel; Future -     Urinalysis, Routine w reflex microscopic; Future -     TSH; Future -     DG Chest 2 View; Future -     EKG 12-Lead  Gastric bypass status for obesity -     IBC + Ferritin; Future -     Vitamin B12; Future -     CBC with Differential/Platelet; Future -     Vitamin B1; Future -     VITAMIN D 25 Hydroxy (Vit-D Deficiency, Fractures); Future -     Zinc; Future -     Folate; Future -     Basic metabolic panel; Future -     Hepatic function panel; Future -     Cholecalciferol; Take 1 capsule (50,000 Units total) by mouth once a week.  Dispense: 12 capsule;  Refill: 0  Deficiency anemia- I will monitor for vitamin deficiencies. -     IBC + Ferritin; Future -     Vitamin B12; Future -     CBC with Differential/Platelet; Future -     Vitamin B1; Future -  Zinc; Future -     Folate; Future -     hCG, quantitative, pregnancy; Future -     HIV Antibody (routine testing w rflx); Future -     HIV-1/HIV-2 Qualitative RNA; Future  Vitamin D deficiency -     VITAMIN D 25 Hydroxy (Vit-D Deficiency, Fractures); Future -     Cholecalciferol; Take 1 capsule (50,000 Units total) by mouth once a week.  Dispense: 12 capsule; Refill: 0  Pre-operative clearance -     Protime-INR; Future -     APTT; Future -     hCG, quantitative, pregnancy; Future -     HIV Antibody (routine testing w rflx); Future -     HIV-1/HIV-2 Qualitative RNA; Future -     Urinalysis, Routine w reflex microscopic; Future -     DG Chest 2 View; Future -     EKG 12-Lead  Colon cancer screening -     Ambulatory referral to Gastroenterology  Bradycardia on ECG- I will monitor her for hypothyroidism. -     EKG 12-Lead -     TSH; Future  Encounter for general adult medical examination with abnormal findings- Exam completed, labs reviewed, vaccines are up-to-date, cancer screenings addressed, patient education was given.  Iron deficiency anemia secondary to inadequate dietary iron intake -     ACCRUFeR; Take 1 capsule (30 mg total) by mouth in the morning and at bedtime.  Dispense: 180 capsule; Refill: 1  B12 deficiency- I have asked her to start parenteral B12 replacement therapy.  Other orders -     Extra Specimen     Follow-up: Return in about 3 months (around 04/26/2023).  Sanda Linger, MD

## 2023-01-24 NOTE — Patient Instructions (Signed)

## 2023-01-29 ENCOUNTER — Encounter: Payer: Self-pay | Admitting: Internal Medicine

## 2023-01-29 LAB — EXTRA SPECIMEN

## 2023-01-29 LAB — HIV ANTIBODY (ROUTINE TESTING W REFLEX): HIV 1&2 Ab, 4th Generation: NONREACTIVE

## 2023-01-29 LAB — ZINC: Zinc: 81 ug/dL (ref 60–130)

## 2023-01-29 LAB — VITAMIN B1

## 2023-01-30 DIAGNOSIS — E538 Deficiency of other specified B group vitamins: Secondary | ICD-10-CM | POA: Insufficient documentation

## 2023-01-30 DIAGNOSIS — D508 Other iron deficiency anemias: Secondary | ICD-10-CM | POA: Insufficient documentation

## 2023-01-31 ENCOUNTER — Ambulatory Visit (INDEPENDENT_AMBULATORY_CARE_PROVIDER_SITE_OTHER): Payer: BC Managed Care – PPO

## 2023-01-31 ENCOUNTER — Other Ambulatory Visit: Payer: Self-pay

## 2023-01-31 ENCOUNTER — Encounter: Payer: Self-pay | Admitting: Internal Medicine

## 2023-01-31 ENCOUNTER — Other Ambulatory Visit: Payer: Self-pay | Admitting: Internal Medicine

## 2023-01-31 DIAGNOSIS — Z9884 Bariatric surgery status: Secondary | ICD-10-CM

## 2023-01-31 DIAGNOSIS — E538 Deficiency of other specified B group vitamins: Secondary | ICD-10-CM

## 2023-01-31 DIAGNOSIS — D508 Other iron deficiency anemias: Secondary | ICD-10-CM

## 2023-01-31 DIAGNOSIS — E559 Vitamin D deficiency, unspecified: Secondary | ICD-10-CM

## 2023-01-31 MED ORDER — ACCRUFER 30 MG PO CAPS: 1.0000 | ORAL_CAPSULE | Freq: Two times a day (BID) | ORAL | 1 refills | Status: DC

## 2023-01-31 MED ORDER — CYANOCOBALAMIN 1000 MCG/ML IJ SOLN
1000.0000 ug | Freq: Once | INTRAMUSCULAR | Status: AC
Start: 2023-01-31 — End: 2023-01-31
  Administered 2023-01-31: 1000 ug via INTRAMUSCULAR

## 2023-01-31 MED ORDER — CHOLECALCIFEROL 1.25 MG (50000 UT) PO CAPS
50000.0000 [IU] | ORAL_CAPSULE | ORAL | 0 refills | Status: AC
Start: 2023-01-31 — End: ?

## 2023-01-31 MED ORDER — FERRIC CITRATE 1 GM 210 MG(FE) PO TABS
210.0000 mg | ORAL_TABLET | Freq: Three times a day (TID) | ORAL | 0 refills | Status: DC
Start: 2023-01-31 — End: 2023-03-20

## 2023-01-31 NOTE — Progress Notes (Signed)
After obtaining consent, and per orders of Dr. [name], injection of B12 given by Alexza Norbeck P Wrigley Plasencia. Patient instructed to report any adverse reaction to me immediately.  

## 2023-02-17 ENCOUNTER — Ambulatory Visit (AMBULATORY_SURGERY_CENTER): Payer: BC Managed Care – PPO | Admitting: *Deleted

## 2023-02-17 ENCOUNTER — Telehealth: Payer: Self-pay | Admitting: *Deleted

## 2023-02-17 VITALS — Ht 64.0 in | Wt 124.0 lb

## 2023-02-17 DIAGNOSIS — Z1211 Encounter for screening for malignant neoplasm of colon: Secondary | ICD-10-CM

## 2023-02-17 MED ORDER — NA SULFATE-K SULFATE-MG SULF 17.5-3.13-1.6 GM/177ML PO SOLN
1.0000 | Freq: Once | ORAL | 0 refills | Status: AC
Start: 2023-02-17 — End: 2023-02-17

## 2023-02-17 NOTE — Progress Notes (Signed)
Pt's name and DOB verified at the beginning of the pre-visit.  Pt denies any difficulty with ambulating,sitting, laying down or rolling side to side Gave both [facility] main # and MD on call # prior to instructions.  No egg or soy allergy known to patient  No issues known to pt with past sedation with any surgeries or procedures Pt denies having issues being intubated Pt has no issues moving head neck or swallowing No FH of Malignant Hyperthermia Pt is not on diet pills Pt is not on home 02  Pt is not on blood thinners  Pt denies issues with constipation  Pt is not on dialysis Pt denise any abnormal heart rhythms  Pt denies any upcoming cardiac testing Pt encouraged to use to use Singlecare or Goodrx to reduce cost  Patient's chart reviewed by Cathlyn Parsons CNRA prior to pre-visit and patient appropriate for the [facility].  Pre-visit completed and red dot placed by patient's name on their procedure day (on provider's schedule).  . Visit by phone Pt states weight is 124lb Instructed pt why it is important to and  to call if they have any changes in health or new medications. Directed them to the # given and on instructions.   Pt states they will.  Instructions reviewed with pt and pt states understanding. Instructed to review again prior to procedure. Pt states they will.  Instructions sent by mail with coupon and by my chart Pt has hx of gastric bypass in 2020 and is concerned about getting prep down and fluid after prep. RN instructed pt to take it slow. Pt states she will do her best.

## 2023-02-17 NOTE — Telephone Encounter (Signed)
Attempt to reach pt for pre-visit. LM with call back #. Will attempt again in 5 minutes. No other # listed. Left instructions to call # given by end of day to reschedule pre-visit per protocol  pre-visit and procedure will be canceled

## 2023-03-18 ENCOUNTER — Encounter: Payer: BC Managed Care – PPO | Admitting: Gastroenterology

## 2023-03-20 ENCOUNTER — Telehealth: Payer: BC Managed Care – PPO | Admitting: Family

## 2023-03-20 DIAGNOSIS — T8149XA Infection following a procedure, other surgical site, initial encounter: Secondary | ICD-10-CM

## 2023-03-20 MED ORDER — CEPHALEXIN 500 MG PO CAPS
500.0000 mg | ORAL_CAPSULE | Freq: Three times a day (TID) | ORAL | 0 refills | Status: AC
Start: 2023-03-20 — End: ?

## 2023-03-20 NOTE — Progress Notes (Signed)
Virtual Visit Consent   Robyn Bryant, you are scheduled for a virtual visit with a [facility] provider today. Just as with appointments in the office, your consent must be obtained to participate. Your consent will be active for this visit and any virtual visit you may have with one of our providers in the next 365 days. If you have a MyChart account, a copy of this consent can be sent to you electronically.  As this is a virtual visit, video technology does not allow for your provider to perform a traditional examination. This may limit your provider's ability to fully assess your condition. If your provider identifies any concerns that need to be evaluated in person or the need to arrange testing (such as labs, EKG, etc.), we will make arrangements to do so. Although advances in technology are sophisticated, we cannot ensure that it will always work on either your end or our end. If the connection with a video visit is poor, the visit may have to be switched to a telephone visit. With either a video or telephone visit, we are not always able to ensure that we have a secure connection.  By engaging in this virtual visit, you consent to the provision of healthcare and authorize for your insurance to be billed (if applicable) for the services provided during this visit. Depending on your insurance coverage, you may receive a charge related to this service.  I need to obtain your verbal consent now. Are you willing to proceed with your visit today? Robyn Bryant has provided verbal consent on 03/20/2023 for a virtual visit (video or telephone). Robyn Rodney, Bryant  Date: 03/20/2023 9:30 AM  Virtual Visit via Video Note   I, Robyn Bryant, connected with  Robyn Bryant  ([id number], [birth year]) on 03/20/23 at  9:30 AM EDT by a video-enabled telemedicine application and verified that I am speaking with the correct person using two identifiers.  Location: Patient: Virtual Visit Location Patient:  Home Provider: Virtual Visit Location Provider: Home Office   I discussed the limitations of evaluation and management by telemedicine and the availability of in person appointments. The patient expressed understanding and agreed to proceed.    History of Present Illness: Robyn Bryant is a 47 y.o. who identifies as a female who was assigned female at birth, and is being seen today for incision redness and tenderness. She has a lower back lift  02/15/23. Denies any fever or discharge. Reports aching pain of 4 out 10. She is soaking the area daily and applying neosporin without relief.   HPI: HPI  Problems:  Patient Active Problem List   Diagnosis Date Noted   B12 deficiency 01/30/2023   Iron deficiency anemia secondary to inadequate dietary iron intake 01/30/2023   Deficiency anemia 01/24/2023   Pre-operative clearance 01/24/2023   Colon cancer screening 01/24/2023   Bradycardia on ECG 01/24/2023   Encounter for general adult medical examination with abnormal findings 01/24/2023   Cervical cancer screening 11/19/2019   Dizzy spells 05/21/2019   Patellofemoral syndrome of both knees 11/09/2016   Visit for screening mammogram 08/23/2016   Vitamin D deficiency 02/03/2015   Gastric bypass status for obesity 12/20/2013   Routine general medical examination at a health care facility 07/14/2011    Allergies:  Allergies  Allergen Reactions   Gabapentin Hives    Hives and dizziness    Morphine And Codeine Itching and Other (See Comments)    Pt can tolerate with Benadryl.    Amoxicillin Itching  Latex Hives   Nsaids Other (See Comments)    Per GI   Dilaudid [Hydromorphone] Itching and Other (See Comments)    Pt can tolerate with Benadryl.    Tape Dermatitis, Itching and Rash   Medications:  Current Outpatient Medications:    cephALEXin (KEFLEX) 500 MG capsule, Take 1 capsule (500 mg total) by mouth 3 (three) times daily., Disp: 21 capsule, Rfl: 0   Cholecalciferol 1.25 MG (50000  UT) capsule, Take 1 capsule (50,000 Units total) by mouth once a week., Disp: 12 capsule, Rfl: 0  Observations/Objective: Patient is well-developed, well-nourished in no acute distress.  Resting comfortably  at home.  Head is normocephalic, atraumatic.  No labored breathing.  Speech is clear and coherent with logical content.  Patient is alert and oriented at baseline.  Incision around buttocks into gluteal fold. Area red and small hole in the gluteal fold.   Assessment and Plan: 1. Infected incision - cephALEXin (KEFLEX) 500 MG capsule; Take 1 capsule (500 mg total) by mouth 3 (three) times daily.  Dispense: 21 capsule; Refill: 0  Start Keflex tid prn Keep clean and dry Avoid sitting for long periods for time. Rotate every 2 hours.  Report any fevers, increased redness, or discharge Follow up as needed    Follow Up Instructions: I discussed the assessment and treatment plan with the patient. The patient was provided an opportunity to ask questions and all were answered. The patient agreed with the plan and demonstrated an understanding of the instructions.  A copy of instructions were sent to the patient via MyChart unless otherwise noted below.    The patient was advised to call back or seek an in-person evaluation if the symptoms worsen or if the condition fails to improve as anticipated.  Time:  I spent 9 minutes with the patient via telehealth technology discussing the above problems/concerns.    Robyn Rodney, Bryant

## 2023-04-27 ENCOUNTER — Telehealth: Payer: Self-pay

## 2023-04-27 ENCOUNTER — Ambulatory Visit: Payer: BC Managed Care – PPO

## 2023-04-27 VITALS — Ht 64.0 in | Wt 127.0 lb

## 2023-04-27 DIAGNOSIS — Z1211 Encounter for screening for malignant neoplasm of colon: Secondary | ICD-10-CM

## 2023-04-27 MED ORDER — SUTAB 1479-225-188 MG PO TABS: 24.0000 | ORAL_TABLET | ORAL | 0 refills | Status: DC

## 2023-04-27 MED ORDER — ONDANSETRON HCL 4 MG PO TABS: 4.0000 mg | ORAL_TABLET | Freq: Three times a day (TID) | ORAL | 1 refills | Status: AC | PRN

## 2023-04-27 NOTE — Telephone Encounter (Signed)
Note sent to Dr. Tomasa Rand for approval of Sutab prep, per patient request.

## 2023-04-27 NOTE — Progress Notes (Signed)
No egg or soy allergy known to patient  No issues known to pt with past sedation with any surgeries or procedures Patient denies ever being told they had issues or difficulty with intubation  No FH of Malignant Hyperthermia Pt is not on diet pills Pt is not on  home 02  Pt is not on blood thinners  Pt denies issues with constipation  No A fib or A flutter Have any cardiac testing pending--no Patient's chart reviewed by Cathlyn Parsons CNRA prior to previsit and patient appropriate for the [facility].  Previsit completed and red dot placed by patient's name on their procedure day (on provider's schedule).    Pt instructed to use Singlecare.com or GoodRx for a price reduction on prep  Ambulates independently  Patient was insistent that she would only take Sutab for her prep. RN emphasized the need to drink the required amounts of water according to provided instructions. Patient stated understanding.

## 2023-04-27 NOTE — Telephone Encounter (Signed)
RN contacted patient for pre-visit prior to upcoming colonoscopy with Dr. Tomasa Rand on 05/27/23. RN reviewed patient medical essentials per pre visit protocol. When preparing to enter prep medication order, patient stated that she would only take Sutab for her prep because she will get sick on her stomach if she has to drink what her husband drank (Suprep).  RN attempted to explain the required number of pills and the amount of water needed for the Sutab prep as well as the effectiveness of other preps which would require less fluid, but patient insisted she did not want to hear about other options--she would only take "the tablets." RN informed patient that Dr. Tomasa Rand would need to approve the Sutab prep. Patient stated she had told Dr. Tomasa Rand when her husband had been here for his colonoscopy that she wanted the tablets. She states Dr. Tomasa Rand told her to tell the pre visit RN to order Sutab. RN told patient she would still need to contact Dr. Tomasa Rand for approval.  As RN started to review the prep instructions with patient, patient cut off RN, asking if she needed to listen to the instructions, as she had just recently watched her husband complete his prep. RN stated that her responsibility is to review each patient's individual instructions with each patient, and this patient's instructions are different from that of her husband's prep instructions. Patient was emphatic that she can read the instructions herself. RN stated that once she hears back from Dr. Tomasa Rand, she will send the approved instructions to the patient's My Chart, as patient requested in lieu of having instructions mailed to her.

## 2023-05-26 ENCOUNTER — Ambulatory Visit (INDEPENDENT_AMBULATORY_CARE_PROVIDER_SITE_OTHER): Payer: BC Managed Care – PPO

## 2023-05-26 DIAGNOSIS — E538 Deficiency of other specified B group vitamins: Secondary | ICD-10-CM

## 2023-05-26 MED ORDER — CYANOCOBALAMIN 1000 MCG/ML IJ SOLN
1000.0000 ug | Freq: Once | INTRAMUSCULAR | Status: AC
Start: 2023-05-26 — End: 2023-05-26
  Administered 2023-05-26: 1000 ug via INTRAMUSCULAR

## 2023-05-26 NOTE — Progress Notes (Signed)
B12 given.  Pt tolerated well. Pt is aware to give the office a call for an side effects or reactions. Please co-sign.

## 2023-05-27 ENCOUNTER — Ambulatory Visit (AMBULATORY_SURGERY_CENTER): Payer: BC Managed Care – PPO | Admitting: Gastroenterology

## 2023-05-27 ENCOUNTER — Encounter: Payer: Self-pay | Admitting: Gastroenterology

## 2023-05-27 VITALS — BP 109/61 | HR 77 | Temp 98.8°F | Resp 13 | Ht 64.0 in | Wt 127.0 lb

## 2023-05-27 DIAGNOSIS — Z1211 Encounter for screening for malignant neoplasm of colon: Secondary | ICD-10-CM | POA: Diagnosis present

## 2023-05-27 MED ORDER — SODIUM CHLORIDE 0.9 % IV SOLN: 500.0000 mL | INTRAVENOUS | Status: DC

## 2023-05-27 NOTE — Op Note (Signed)
[facility] Patient Name: Robyn Bryant Procedure Date: 05/27/2023 1:15 PM MRN: [id number] Endoscopist: Lorin Picket E. Tomasa Rand , MD, [id number] Age: 47 Referring MD:  Date of Birth: [birth year] Gender: Female Account #: [id number] Procedure:                Colonoscopy Indications:              Screening for colorectal malignant neoplasm, This                            is the patient's first colonoscopy Medicines:                Monitored Anesthesia Care Procedure:                Pre-Anesthesia Assessment:                           - Prior to the procedure, a History and Physical                            was performed, and patient medications and                            allergies were reviewed. The patient's tolerance of                            previous anesthesia was also reviewed. The risks                            and benefits of the procedure and the sedation                            options and risks were discussed with the patient.                            All questions were answered, and informed consent                            was obtained. Prior Anticoagulants: The patient has                            taken no anticoagulant or antiplatelet agents. ASA                            Grade Assessment: II - A patient with mild systemic                            disease. After reviewing the risks and benefits,                            the patient was deemed in satisfactory condition to                            undergo the procedure.  After obtaining informed consent, the colonoscope                            was passed under direct vision. Throughout the                            procedure, the patient's blood pressure, pulse, and                            oxygen saturations were monitored continuously. The                            Olympus Scope [id number] was introduced through the                            anus and advanced  to the the terminal ileum, with                            identification of the appendiceal orifice and IC                            valve. The colonoscopy was performed without                            difficulty. The patient tolerated the procedure                            well. The quality of the bowel preparation was                            good. The terminal ileum, ileocecal valve,                            appendiceal orifice, and rectum were photographed.                            The bowel preparation used was SUTAB via split dose                            instruction. Scope In: 1:20:39 PM Scope Out: 1:37:32 PM Scope Withdrawal Time: 0 hours 11 minutes 16 seconds  Total Procedure Duration: 0 hours 16 minutes 53 seconds  Findings:                 The perianal and digital rectal examinations were                            normal. Pertinent negatives include normal                            sphincter tone and no palpable rectal lesions.                           The colon (entire examined portion) appeared normal.  The terminal ileum appeared normal.                           The retroflexed view of the distal rectum and anal                            verge was normal and showed no anal or rectal                            abnormalities. Complications:            No immediate complications. Estimated Blood Loss:     Estimated blood loss: none. Impression:               - The entire examined colon is normal.                           - The examined portion of the ileum was normal.                           - The distal rectum and anal verge are normal on                            retroflexion view.                           - No specimens collected. Recommendation:           - Patient has a contact number available for                            emergencies. The signs and symptoms of potential                            delayed complications  were discussed with the                            patient. Return to normal activities tomorrow.                            Written discharge instructions were provided to the                            patient.                           - Resume previous diet.                           - Continue present medications.                           - Repeat colonoscopy in 10 years for screening                            purposes. [name] E. Tomasa Rand, MD 05/27/2023 1:41:38 PM This report has been signed electronically.

## 2023-05-27 NOTE — Progress Notes (Signed)
Sedate, gd SR, tolerated procedure well, VSS, report to RN 

## 2023-05-27 NOTE — Patient Instructions (Signed)
Resume all of your previous medications today. Read all of your previous medications today.  YOU HAD AN ENDOSCOPIC PROCEDURE TODAY AT THE [facility]:   Refer to the procedure report that was given to you for any specific questions about what was found during the examination.  If the procedure report does not answer your questions, please call your gastroenterologist to clarify.  If you requested that your care partner not be given the details of your procedure findings, then the procedure report has been included in a sealed envelope for you to review at your convenience later.  YOU SHOULD EXPECT: Some feelings of bloating in the abdomen. Passage of more gas than usual.  Walking can help get rid of the air that was put into your GI tract during the procedure and reduce the bloating. If you had a lower endoscopy (such as a colonoscopy or flexible sigmoidoscopy) you may notice spotting of blood in your stool or on the toilet paper. If you underwent a bowel prep for your procedure, you may not have a normal bowel movement for a few days.  Please Note:  You might notice some irritation and congestion in your nose or some drainage.  This is from the oxygen used during your procedure.  There is no need for concern and it should clear up in a day or so.  SYMPTOMS TO REPORT IMMEDIATELY:  Following lower endoscopy (colonoscopy or flexible sigmoidoscopy):  Excessive amounts of blood in the stool  Significant tenderness or worsening of abdominal pains  Swelling of the abdomen that is new, acute  Fever of 100F or higher   For urgent or emergent issues, a gastroenterologist can be reached at any hour by calling [phone]. Do not use MyChart messaging for urgent concerns.    DIET:  We do recommend a small meal at first, but then you may proceed to your regular diet.  Drink plenty of fluids but you should avoid alcoholic beverages for 24 hours.  ACTIVITY:  You should plan to take it easy  for the rest of today and you should NOT DRIVE or use heavy machinery until tomorrow (because of the sedation medicines used during the test).    FOLLOW UP: Our staff will call the number listed on your records the next business day following your procedure.  We will call around 7:15- 8:00 am to check on you and address any questions or concerns that you may have regarding the information given to you following your procedure. If we do not reach you, we will leave a message.     If any biopsies were taken you will be contacted by phone or by letter within the next 1-3 weeks.  Please call us at [phone] if you have not heard about the biopsies in 3 weeks.    SIGNATURES/CONFIDENTIALITY: You and/or your care partner have signed paperwork which will be entered into your electronic medical record.  These signatures attest to the fact that that the information above on your After Visit Summary has been reviewed and is understood.  Full responsibility of the confidentiality of this discharge information lies with you and/or your care-partner.

## 2023-05-27 NOTE — Progress Notes (Signed)
[facility] History and Physical   Primary Care Physician:  Etta Grandchild, MD   Reason for Procedure:   Colon cancer screening  Plan:    Screening colonoscopy     HPI: Robyn Bryant is a 47 y.o. female undergoing initial average risk screening colonoscopy.  She has no family history of colon cancer and no chronic GI symptoms.    Past Medical History:  Diagnosis Date   Arthritis    Bilateral knees, wrist   Asthma    as teenager, none now  pt.denies   History of carpal tunnel syndrome    History of seizure    noted on  EEG, trigger by migraines   Insomnia    resolved   Low back pain    resolved   Migraines    Panniculitis    PONV (postoperative nausea and vomiting)    Thiamine deficiency    Toe fracture, left 12/26/2015   Vitamin B 12 deficiency    Vitamin D deficiency    pt unaware    Past Surgical History:  Procedure Laterality Date   BREAST BIOPSY Left    BREAST CYST ASPIRATION     CARPAL TUNNEL RELEASE Right 2016   CESAREAN SECTION  2001   CHOLECYSTECTOMY N/A 09/07/2013   Procedure: LAPAROSCOPIC CHOLECYSTECTOMY WITH INTRAOPERATIVE CHOLANGIOGRAM;  Surgeon: Mariella Saa, MD;  Location: [facility];  Service: General;  Laterality: N/A;   ESOPHAGOGASTRODUODENOSCOPY N/A 09/06/2013   Procedure: ESOPHAGOGASTRODUODENOSCOPY (EGD);  Surgeon: Kandis Cocking, MD;  Location: [facility];  Service: General;  Laterality: N/A;   ESOPHAGOGASTRODUODENOSCOPY (EGD) WITH PROPOFOL N/A 10/24/2017   Procedure: ESOPHAGOGASTRODUODENOSCOPY (EGD) WITH PROPOFOL;  Surgeon: Ovidio Kin, MD;  Location: [facility];  Service: General;  Laterality: N/A;   GASTRIC ROUX-EN-Y N/A 09/12/2018   Procedure: LAPAROSCOPIC ROUX-EN-Y GASTRIC BYPASS REVISION FROM GASTRIC SLEEVE WITH UPPER ENDOSCOPY, ERAS Pathway;  Surgeon: Glenna Fellows, MD;  Location: [facility];  Service: General;  Laterality: N/A;   LAPAROSCOPIC GASTRIC SLEEVE RESECTION N/A 08/08/2013   Procedure: LAPAROSCOPIC GASTRIC  SLEEVE RESECTION UPPER ENDOSCOPY;  Surgeon: Mariella Saa, MD;  Location: [facility];  Service: General;  Laterality: N/A;   TUBAL LIGATION  2001   UPPER GI ENDOSCOPY  08/08/2013   Procedure: UPPER GI ENDOSCOPY;  Surgeon: Mariella Saa, MD;  Location: [facility];  Service: General;;    Prior to Admission medications   Medication Sig Start Date End Date Taking? Authorizing Provider  cephALEXin (KEFLEX) 500 MG capsule Take 1 capsule (500 mg total) by mouth 3 (three) times daily. Patient not taking: Reported on 04/27/2023 03/20/23   Junie Spencer, FNP  Cholecalciferol 1.25 MG (50000 UT) capsule Take 1 capsule (50,000 Units total) by mouth once a week. Patient not taking: Reported on 04/27/2023 01/31/23   Etta Grandchild, MD  ondansetron (ZOFRAN) 4 MG tablet Take 1 tablet (4 mg total) by mouth every 8 (eight) hours as needed for nausea or vomiting. 04/27/23   Jenel Lucks, MD    Current Outpatient Medications  Medication Sig Dispense Refill   cephALEXin (KEFLEX) 500 MG capsule Take 1 capsule (500 mg total) by mouth 3 (three) times daily. (Patient not taking: Reported on 04/27/2023) 21 capsule 0   Cholecalciferol 1.25 MG (50000 UT) capsule Take 1 capsule (50,000 Units total) by mouth once a week. (Patient not taking: Reported on 04/27/2023) 12 capsule 0   ondansetron (ZOFRAN) 4 MG tablet Take 1 tablet (4 mg total) by mouth every 8 (eight) hours as needed  for nausea or vomiting. 30 tablet 1   Current Facility-Administered Medications  Medication Dose Route Frequency Provider Last Rate Last Admin   0.9 %  sodium chloride infusion  500 mL Intravenous Continuous Jenel Lucks, MD        Allergies as of 05/27/2023 - Review Complete 05/27/2023  Allergen Reaction Noted   Gabapentin Hives 09/12/2018   Morphine and codeine Itching and Other (See Comments) 08/09/2013   Amoxicillin Itching 10/14/2017   Latex Hives 05/10/2013   Nsaids Other (See Comments) 04/02/2019   Dilaudid  [hydromorphone] Itching and Other (See Comments) 08/09/2013   Tape Dermatitis, Itching, and Rash 04/10/2019    Family History  Problem Relation Age of Onset   Diabetes Mother    Hypertension Mother    Alcohol abuse Father    Asthma Sister    Colon cancer Neg Hx    Colon polyps Neg Hx    Esophageal cancer Neg Hx    Rectal cancer Neg Hx    Stomach cancer Neg Hx     Social History   Socioeconomic History   Marital status: Married    Spouse name: Not on file   Number of children: Not on file   Years of education: Not on file   Highest education level: Not on file  Occupational History   Not on file  Tobacco Use   Smoking status: Never   Smokeless tobacco: Never  Vaping Use   Vaping status: Never Used  Substance and Sexual Activity   Alcohol use: No   Drug use: No   Sexual activity: Yes    Birth control/protection: Surgical  Other Topics Concern   Not on file  Social History Narrative   Not on file   Social Determinants of Health   Financial Resource Strain: Not on file  Food Insecurity: No Food Insecurity (07/06/2017)   Hunger Vital Sign    Worried About Radiation protection practitioner of Food in the Last Year: Never true    Ran Out of Food in the Last Year: Never true  Transportation Needs: Not on file  Physical Activity: Not on file  Stress: Not on file  Social Connections: Not on file  Intimate Partner Violence: Not on file    Review of Systems:  All other review of systems negative except as mentioned in the HPI.  Physical Exam: Vital signs BP 105/67   Pulse (!) 59   Temp 98.8 F (37.1 C)   Ht 5\' 4"  (1.626 m)   Wt 127 lb (57.6 kg)   SpO2 99%   BMI 21.80 kg/m   General:   Alert,  Well-developed, well-nourished, pleasant and cooperative in NAD Airway:  Mallampati 1 Lungs:  Clear throughout to auscultation.   Heart:  Regular rate and rhythm; no murmurs, clicks, rubs,  or gallops. Abdomen:  Soft, nontender and nondistended. Normal bowel sounds.   Neuro/Psych:   Normal mood and affect. A and O x 3   Marzella Miracle E. Tomasa Rand, MD [facility]

## 2023-05-27 NOTE — Progress Notes (Signed)
Pt's states no medical or surgical changes since previsit or office visit. 

## 2023-05-30 ENCOUNTER — Telehealth: Payer: Self-pay | Admitting: *Deleted

## 2023-05-30 NOTE — Telephone Encounter (Signed)
  Follow up Call-     05/27/2023   12:57 PM  Call back number  Post procedure Call Back phone  # [phone]  Permission to leave phone message Yes     Patient questions:   Message left to call if necessary.

## 2023-06-27 ENCOUNTER — Ambulatory Visit: Payer: BC Managed Care – PPO

## 2023-08-31 ENCOUNTER — Other Ambulatory Visit: Payer: Self-pay | Admitting: Obstetrics and Gynecology

## 2023-08-31 DIAGNOSIS — R928 Other abnormal and inconclusive findings on diagnostic imaging of breast: Secondary | ICD-10-CM

## 2023-09-05 ENCOUNTER — Ambulatory Visit
Admission: RE | Admit: 2023-09-05 | Discharge: 2023-09-05 | Disposition: A | Discharge status: Home / Self Care | Payer: Medicaid Other | Source: Ambulatory Visit | Attending: Obstetrics and Gynecology | Admitting: Obstetrics and Gynecology

## 2023-09-05 ENCOUNTER — Ambulatory Visit
Admission: RE | Admit: 2023-09-05 | Discharge: 2023-09-05 | Disposition: A | Discharge status: Home / Self Care | Payer: 59 | Source: Ambulatory Visit | Attending: Obstetrics and Gynecology | Admitting: Obstetrics and Gynecology

## 2023-09-05 ENCOUNTER — Other Ambulatory Visit: Payer: Self-pay | Admitting: Obstetrics and Gynecology

## 2023-09-05 DIAGNOSIS — N6310 Unspecified lump in the right breast, unspecified quadrant: Secondary | ICD-10-CM

## 2023-09-05 DIAGNOSIS — R921 Mammographic calcification found on diagnostic imaging of breast: Secondary | ICD-10-CM

## 2023-09-05 DIAGNOSIS — R928 Other abnormal and inconclusive findings on diagnostic imaging of breast: Secondary | ICD-10-CM

## 2023-09-26 ENCOUNTER — Telehealth: Payer: 59 | Admitting: Physician Assistant

## 2023-09-26 DIAGNOSIS — J069 Acute upper respiratory infection, unspecified: Secondary | ICD-10-CM | POA: Diagnosis not present

## 2023-09-26 MED ORDER — BENZONATATE 100 MG PO CAPS: 100.0000 mg | ORAL_CAPSULE | Freq: Two times a day (BID) | ORAL | 0 refills | Status: AC | PRN

## 2023-09-26 MED ORDER — CETIRIZINE HCL 10 MG PO TABS
10.0000 mg | ORAL_TABLET | Freq: Every day | ORAL | 0 refills | Status: AC
Start: 2023-09-26 — End: ?

## 2023-09-26 MED ORDER — FLUTICASONE PROPIONATE 50 MCG/ACT NA SUSP
2.0000 | Freq: Every day | NASAL | 6 refills | Status: AC
Start: 2023-09-26 — End: ?

## 2023-09-26 NOTE — Progress Notes (Signed)
 I have spent 5 minutes in review of e-visit questionnaire, review and updating patient chart, medical decision making and response to patient.   Laure Kidney, PA-C

## 2023-09-26 NOTE — Progress Notes (Signed)

## 2024-03-05 ENCOUNTER — Other Ambulatory Visit: Payer: Self-pay | Admitting: Obstetrics and Gynecology

## 2024-03-05 ENCOUNTER — Ambulatory Visit: Admission: RE | Admit: 2024-03-05 | Source: Ambulatory Visit

## 2024-03-05 ENCOUNTER — Ambulatory Visit
Admission: RE | Admit: 2024-03-05 | Discharge: 2024-03-05 | Disposition: A | Discharge status: Home / Self Care | Source: Ambulatory Visit | Attending: Obstetrics and Gynecology | Admitting: Obstetrics and Gynecology

## 2024-03-05 DIAGNOSIS — R921 Mammographic calcification found on diagnostic imaging of breast: Secondary | ICD-10-CM

## 2024-03-05 DIAGNOSIS — N6310 Unspecified lump in the right breast, unspecified quadrant: Secondary | ICD-10-CM

## 2024-04-04 ENCOUNTER — Telehealth: Admitting: Nurse Practitioner

## 2024-04-04 DIAGNOSIS — J069 Acute upper respiratory infection, unspecified: Secondary | ICD-10-CM

## 2024-04-04 NOTE — Progress Notes (Signed)
  E-Visit for Sore Throat  We are sorry that you are not feeling well.  Here is how we plan to help!  Your symptoms indicate a likely viral infection (Pharyngitis).   Pharyngitis is inflammation in the back of the throat which can cause a sore throat, scratchiness and sometimes difficulty swallowing.   Pharyngitis is typically caused by a respiratory virus and will just run its course.  Please keep in mind that your symptoms could last up to 10 days.  For throat pain, we recommend over the counter oral pain relief medications such as acetaminophen or aspirin, or anti-inflammatory medications such as ibuprofen or naproxen sodium.  Topical treatments such as oral throat lozenges or sprays may be used as needed.  Avoid close contact with loved ones, especially the very young and elderly.  Remember to wash your hands thoroughly throughout the day as this is the number one way to prevent the spread of infection and wipe down door knobs and counters with disinfectant.  After careful review of your answers, I would not recommend an antibiotic for your condition.  Antibiotics should not be used to treat conditions that we suspect are caused by viruses like the virus that causes the common cold or flu. However, some people can have Strep with atypical symptoms. You may need formal testing in clinic or office to confirm if your symptoms continue or worsen.  Providers prescribe antibiotics to treat infections caused by bacteria. Antibiotics are very powerful in treating bacterial infections when they are used properly.  To maintain their effectiveness, they should be used only when necessary.  Overuse of antibiotics has resulted in the development of super bugs that are resistant to treatment!    Home Care: Only take medications as instructed by your medical team. Do not drink alcohol while taking these medications. A steam or ultrasonic humidifier can help congestion.  You can place a towel over your head and  breathe in the steam from hot water coming from a faucet. Avoid close contacts especially the very young and the elderly. Cover your mouth when you cough or sneeze. Always remember to wash your hands.  Get Help Right Away If: You develop worsening fever or throat pain. You develop a severe head ache or visual changes. Your symptoms persist after you have completed your treatment plan.  Make sure you Understand these instructions. Will watch your condition. Will get help right away if you are not doing well or get worse.   Thank you for choosing an e-visit.  Your e-visit answers were reviewed by a board certified advanced clinical practitioner to complete your personal care plan. Depending upon the condition, your plan could have included both over the counter or prescription medications.  Please review your pharmacy choice. Make sure the pharmacy is open so you can pick up prescription now. If there is a problem, you may contact your provider through [employer] and have the prescription routed to another pharmacy.  Your safety is important to [place]. If you have drug allergies check your prescription carefully.   For the next 24 hours you can use MyChart to ask questions about today's visit, request a non-urgent call back, or ask for a work or school excuse. You will get an email in the next two days asking about your experience. I hope that your e-visit has been valuable and will speed your recovery.   I spent approximately 5 minutes reviewing the patient's history, current symptoms and coordinating their care today.

## 2024-05-07 ENCOUNTER — Other Ambulatory Visit: Payer: Self-pay | Admitting: Internal Medicine

## 2024-05-07 ENCOUNTER — Other Ambulatory Visit

## 2024-05-07 ENCOUNTER — Encounter (INDEPENDENT_AMBULATORY_CARE_PROVIDER_SITE_OTHER): Payer: Self-pay

## 2024-05-07 DIAGNOSIS — Z006 Encounter for examination for normal comparison and control in clinical research program: Secondary | ICD-10-CM

## 2024-05-15 LAB — GENECONNECT MOLECULAR SCREEN: Genetic Analysis Overall Interpretation: NEGATIVE

## 2024-09-24 ENCOUNTER — Encounter
